# Patient Record
Sex: Male | Born: 1935 | Race: White | Hispanic: No | Marital: Married | Smoking: Never smoker
Health system: Southern US, Community
[De-identification: ages and names within clinical notes are randomized; demographics above are authoritative.]

## PROBLEM LIST (undated history)

## (undated) DIAGNOSIS — N419 Inflammatory disease of prostate, unspecified: Secondary | ICD-10-CM

## (undated) DIAGNOSIS — C801 Malignant (primary) neoplasm, unspecified: Secondary | ICD-10-CM

## (undated) DIAGNOSIS — I10 Essential (primary) hypertension: Secondary | ICD-10-CM

## (undated) HISTORY — PX: BACK SURGERY: SHX140

---

## 2011-09-04 ENCOUNTER — Emergency Department (HOSPITAL_COMMUNITY): Payer: Medicare Other

## 2011-09-04 ENCOUNTER — Encounter (HOSPITAL_COMMUNITY): Payer: Self-pay | Admitting: *Deleted

## 2011-09-04 ENCOUNTER — Emergency Department (HOSPITAL_COMMUNITY)
Admission: EM | Admit: 2011-09-04 | Discharge: 2011-09-04 | Disposition: A | Discharge status: Home / Self Care | Payer: Medicare Other | Attending: Emergency Medicine | Admitting: Emergency Medicine

## 2011-09-04 DIAGNOSIS — F17200 Nicotine dependence, unspecified, uncomplicated: Secondary | ICD-10-CM | POA: Diagnosis not present

## 2011-09-04 DIAGNOSIS — N400 Enlarged prostate without lower urinary tract symptoms: Secondary | ICD-10-CM | POA: Diagnosis not present

## 2011-09-04 DIAGNOSIS — R339 Retention of urine, unspecified: Secondary | ICD-10-CM

## 2011-09-04 DIAGNOSIS — R319 Hematuria, unspecified: Secondary | ICD-10-CM

## 2011-09-04 DIAGNOSIS — I10 Essential (primary) hypertension: Secondary | ICD-10-CM | POA: Diagnosis not present

## 2011-09-04 DIAGNOSIS — N390 Urinary tract infection, site not specified: Secondary | ICD-10-CM | POA: Diagnosis not present

## 2011-09-04 HISTORY — DX: Essential (primary) hypertension: I10

## 2011-09-04 HISTORY — DX: Inflammatory disease of prostate, unspecified: N41.9

## 2011-09-04 LAB — BASIC METABOLIC PANEL
BUN: 10 mg/dL (ref 6–23)
Chloride: 96 mEq/L (ref 96–112)
GFR calc non Af Amer: >90 mL/min (ref >90)
Glucose, Bld: 178 mg/dL — ABNORMAL HIGH (ref 70–99)
Potassium: 3 mEq/L — ABNORMAL LOW (ref 3.5–5.1)
Sodium: 131 mEq/L — ABNORMAL LOW (ref 135–145)

## 2011-09-04 LAB — URINALYSIS, ROUTINE W REFLEX MICROSCOPIC
Bilirubin Urine: NEGATIVE
Ketones, ur: NEGATIVE mg/dL
Leukocytes, UA: NEGATIVE
Nitrite: NEGATIVE
Protein, ur: 30 mg/dL — AB
Urobilinogen, UA: 0.2 mg/dL (ref 0.0–1.0)
pH: 7 (ref 5.0–8.0)

## 2011-09-04 LAB — POCT I-STAT, CHEM 8
BUN: 10 mg/dL (ref 6–23)
Calcium, Ion: 1.09 mmol/L — ABNORMAL LOW (ref 1.13–1.30)
Chloride: 101 mEq/L (ref 96–112)
Glucose, Bld: 181 mg/dL — ABNORMAL HIGH (ref 70–99)
HCT: 44 % (ref 39.0–52.0)
Potassium: 3.2 mEq/L — ABNORMAL LOW (ref 3.5–5.1)

## 2011-09-04 LAB — CBC WITH DIFFERENTIAL/PLATELET
Basophils Absolute: 0 10*3/uL (ref 0.0–0.1)
Eosinophils Relative: 1 % (ref 0–5)
HCT: 39.5 % (ref 39.0–52.0)
Hemoglobin: 13.7 g/dL (ref 13.0–17.0)
Lymphocytes Relative: 12 % (ref 12–46)
Lymphs Abs: 1 10*3/uL (ref 0.7–4.0)
MCV: 89.8 fL (ref 78.0–100.0)
Monocytes Absolute: 0.5 10*3/uL (ref 0.1–1.0)
Monocytes Relative: 7 % (ref 3–12)
Neutro Abs: 6.7 10*3/uL (ref 1.7–7.7)
RBC: 4.4 MIL/uL (ref 4.22–5.81)
WBC: 8.3 10*3/uL (ref 4.0–10.5)

## 2011-09-04 LAB — URINE MICROSCOPIC-ADD ON

## 2011-09-04 MED ORDER — HYDROCODONE-ACETAMINOPHEN 5-325 MG PO TABS: 2.0000 | ORAL_TABLET | ORAL | Status: AC | PRN

## 2011-09-04 MED ORDER — SULFAMETHOXAZOLE-TRIMETHOPRIM 800-160 MG PO TABS: 1.0000 | ORAL_TABLET | Freq: Two times a day (BID) | ORAL | Status: AC

## 2011-09-04 NOTE — ED Provider Notes (Signed)
History     CSN: [id number]  Arrival date & time 09/04/11  1814   First MD Initiated Contact with Patient 09/04/11 1824      Chief Complaint  Patient presents with  . Hematuria  . Urinary Retention    (Consider location/radiation/quality/duration/timing/severity/associated sxs/prior treatment) HPI Comments: Patient with history of "prostate problems" presenting with difficulty with urination or blood in the urine. States he noticed blood in his urine last night which persisted all day. A more difficult to urinate he has been unable to urinate for several hours. Denies any fever or vomiting. Complains of bladder discomfort. No back pain, testicular pain, chest pain or shortness of breath. Is not taking any anticoagulants. Has had prostatitis in the past and required foley catheter.  The history is provided by the patient and the spouse.    Past Medical History  Diagnosis Date  . Prostatitis   . Hypertension     Past Surgical History  Procedure Date  . Back surgery     No family history on file.  History  Substance Use Topics  . Smoking status: Current Everyday Smoker  . Smokeless tobacco: Not on file  . Alcohol Use: Yes      Review of Systems  Constitutional: Negative for fever, activity change and appetite change.  HENT: Negative for congestion and rhinorrhea.   Respiratory: Negative for cough, chest tightness and shortness of breath.   Cardiovascular: Negative for chest pain.  Gastrointestinal: Positive for abdominal pain. Negative for nausea and vomiting.  Genitourinary: Positive for dysuria, hematuria and difficulty urinating. Negative for testicular pain.  Musculoskeletal: Negative for back pain.  Neurological: Negative for dizziness and headaches.    Allergies  Review of patient's allergies indicates no known allergies.  Home Medications  No current outpatient prescriptions on file.  BP 200/119  Pulse 94  Temp 98.5 F (36.9 C) (Oral)  Resp 18  Wt  195 lb (88.451 kg)  SpO2 99%  Physical Exam  Constitutional: He is oriented to person, place, and time. He appears well-developed and well-nourished.       uncomfortable  HENT:  Head: Normocephalic and atraumatic.  Mouth/Throat: Oropharynx is clear and moist. No oropharyngeal exudate.  Eyes: Conjunctivae are normal. Pupils are equal, round, and reactive to light.  Neck: Normal range of motion.  Cardiovascular: Normal rate, regular rhythm and normal heart sounds.   Pulmonary/Chest: Effort normal and breath sounds normal. No respiratory distress.  Abdominal: Soft. There is tenderness. There is no rebound and no guarding.       Suprapubic tenderness  Genitourinary:       Prostate firm and nontender  Musculoskeletal: Normal range of motion. He exhibits no edema and no tenderness.  Neurological: He is alert and oriented to person, place, and time. No cranial nerve deficit.  Skin: Skin is warm.    ED Course  Procedures (including critical care time)  Labs Reviewed  URINALYSIS, ROUTINE W REFLEX MICROSCOPIC - Abnormal; Notable for the following:    Color, Urine RED (*)  BIOCHEMICALS MAY BE AFFECTED BY COLOR   Specific Gravity, Urine <1.005 (*)     Hgb urine dipstick LARGE (*)     Protein, ur 30 (*)     All other components within normal limits  CBC WITH DIFFERENTIAL - Abnormal; Notable for the following:    Neutrophils Relative 81 (*)     All other components within normal limits  BASIC METABOLIC PANEL - Abnormal; Notable for the following:    Sodium  131 (*)     Potassium 3.0 (*)     Glucose, Bld 178 (*)     All other components within normal limits  POCT I-STAT, CHEM 8 - Abnormal; Notable for the following:    Potassium 3.2 (*)     Glucose, Bld 181 (*)     Calcium, Ion 1.09 (*)     All other components within normal limits  URINE MICROSCOPIC-ADD ON - Abnormal; Notable for the following:    Bacteria, UA MANY (*)     All other components within normal limits  PROTIME-INR    URINE CULTURE   Ct Abdomen Pelvis Wo Contrast  09/04/2011  *RADIOLOGY REPORT*  Clinical Data: Gross hematuria.  Urinary retention.  History of prostatitis.  CT ABDOMEN AND PELVIS WITHOUT CONTRAST  Technique:  Multidetector CT imaging of the abdomen and pelvis was performed following the standard protocol without intravenous contrast.  Comparison: None.  Findings: There is abnormal pleural and parenchymal density affecting the right middle lobe and adjacent pleura.  There is coronary artery calcification.  No pericardial or pleural fluid.  The liver has a normal appearance without focal lesions or biliary ductal dilatation.  No calcified gallstones.  The spleen is normal. The pancreas is normal.  The adrenal glands are normal.  There is no renal mass, cyst or hydronephrosis.  There is a 1 mm calcification in the right kidney that could be a stone or vascular.  The aorta shows atherosclerosis but no aneurysm.  The IVC is unremarkable.  No retroperitoneal mass or adenopathy.  No free intraperitoneal fluid or air.  No bowel pathology is seen other than sigmoid diverticulosis without evidence of diverticulitis.  A Foley catheter is present in the bladder.  The bladder is decompressed.  No discernible bladder lesion.  The prostate gland is slightly enlarged.  Seminal vesicles appear normal.  Ordinary degenerative changes effect the spine.  IMPRESSION: No acute finding.  Foley catheter in collapsed bladder.  Slightly enlarged prostate.  No renal abnormalities seen.  Pleural and parenchymal scarring in the right middle lobe and adjacent pleura.  Original Report Authenticated By: Thomasenia Sales, M.D.     No diagnosis found.    MDM  Urinary retention and hematuria x 1 day. Abdomen soft and nontender.  Prostate firm and nonboggy.  Gross hematuria with spontaneous clearing upon foley placement. Hemoglobin stable. Orthostatics negative.  CT negative for obstructing process, stone, or mass.   UA with RBCs,  WBCs, bacteria. Nitrite and LE negative.  Will treat for UTI.  D/w patient importance of followup with urology to further evaluate hematuria.  Will need cystoscopy. Patient expresses understanding.  Will d/c with foley.  Return precautions discussed.     Glynn Octave, MD 09/04/11 2053

## 2011-09-04 NOTE — ED Notes (Signed)
Pt states he began noticing a lot of blood in his urine last night and has gotten worse. Pt also states urinary retention.

## 2011-09-04 NOTE — ED Notes (Signed)
Foley patent, bright pink color urine in foley tube, pt verbalizes relief from pressure to lower abd but admits to nausea at this time

## 2011-09-04 NOTE — ED Notes (Signed)
Explained to pt and wife on how to use foley leg bag and hygiene of foley, d/c instructions and prescriptions reviewed as well, wife and pt verbalized understanding, pt insisted on ambulating out and refused wheelchair, instructed pt to encourage fluids

## 2011-09-06 NOTE — ED Notes (Signed)
Chart was opened due to pt calling and asking question about his discharge instructions concerning his catheter.

## 2011-09-07 LAB — URINE CULTURE

## 2011-09-09 DIAGNOSIS — R310 Gross hematuria: Secondary | ICD-10-CM | POA: Diagnosis not present

## 2011-09-09 DIAGNOSIS — N401 Enlarged prostate with lower urinary tract symptoms: Secondary | ICD-10-CM | POA: Diagnosis not present

## 2011-10-06 DIAGNOSIS — N401 Enlarged prostate with lower urinary tract symptoms: Secondary | ICD-10-CM | POA: Diagnosis not present

## 2011-10-06 DIAGNOSIS — R350 Frequency of micturition: Secondary | ICD-10-CM | POA: Diagnosis not present

## 2011-10-06 DIAGNOSIS — R310 Gross hematuria: Secondary | ICD-10-CM | POA: Diagnosis not present

## 2011-10-19 DIAGNOSIS — N401 Enlarged prostate with lower urinary tract symptoms: Secondary | ICD-10-CM | POA: Diagnosis not present

## 2011-10-21 DIAGNOSIS — R350 Frequency of micturition: Secondary | ICD-10-CM | POA: Diagnosis not present

## 2011-10-21 DIAGNOSIS — R972 Elevated prostate specific antigen [PSA]: Secondary | ICD-10-CM | POA: Diagnosis not present

## 2011-12-02 DIAGNOSIS — R350 Frequency of micturition: Secondary | ICD-10-CM | POA: Diagnosis not present

## 2011-12-02 DIAGNOSIS — C679 Malignant neoplasm of bladder, unspecified: Secondary | ICD-10-CM | POA: Diagnosis not present

## 2011-12-02 DIAGNOSIS — R972 Elevated prostate specific antigen [PSA]: Secondary | ICD-10-CM | POA: Diagnosis not present

## 2011-12-02 DIAGNOSIS — N401 Enlarged prostate with lower urinary tract symptoms: Secondary | ICD-10-CM | POA: Diagnosis not present

## 2011-12-02 DIAGNOSIS — R339 Retention of urine, unspecified: Secondary | ICD-10-CM | POA: Diagnosis not present

## 2011-12-31 DIAGNOSIS — R39198 Other difficulties with micturition: Secondary | ICD-10-CM | POA: Diagnosis not present

## 2011-12-31 DIAGNOSIS — R3129 Other microscopic hematuria: Secondary | ICD-10-CM | POA: Diagnosis not present

## 2011-12-31 DIAGNOSIS — R350 Frequency of micturition: Secondary | ICD-10-CM | POA: Diagnosis not present

## 2011-12-31 DIAGNOSIS — N401 Enlarged prostate with lower urinary tract symptoms: Secondary | ICD-10-CM | POA: Diagnosis not present

## 2012-02-02 DIAGNOSIS — R310 Gross hematuria: Secondary | ICD-10-CM | POA: Diagnosis not present

## 2012-02-02 DIAGNOSIS — R972 Elevated prostate specific antigen [PSA]: Secondary | ICD-10-CM | POA: Diagnosis not present

## 2012-02-02 DIAGNOSIS — R339 Retention of urine, unspecified: Secondary | ICD-10-CM | POA: Diagnosis not present

## 2012-02-02 DIAGNOSIS — I10 Essential (primary) hypertension: Secondary | ICD-10-CM | POA: Diagnosis not present

## 2012-02-07 ENCOUNTER — Emergency Department (HOSPITAL_COMMUNITY): Payer: Medicare Other

## 2012-02-07 ENCOUNTER — Emergency Department (HOSPITAL_COMMUNITY)
Admission: EM | Admit: 2012-02-07 | Discharge: 2012-02-07 | Disposition: A | Discharge status: Home / Self Care | Payer: Medicare Other | Attending: Emergency Medicine | Admitting: Emergency Medicine

## 2012-02-07 ENCOUNTER — Encounter (HOSPITAL_COMMUNITY): Payer: Self-pay | Admitting: *Deleted

## 2012-02-07 DIAGNOSIS — Z862 Personal history of diseases of the blood and blood-forming organs and certain disorders involving the immune mechanism: Secondary | ICD-10-CM | POA: Diagnosis not present

## 2012-02-07 DIAGNOSIS — Y846 Urinary catheterization as the cause of abnormal reaction of the patient, or of later complication, without mention of misadventure at the time of the procedure: Secondary | ICD-10-CM | POA: Insufficient documentation

## 2012-02-07 DIAGNOSIS — R3919 Other difficulties with micturition: Secondary | ICD-10-CM | POA: Insufficient documentation

## 2012-02-07 DIAGNOSIS — I10 Essential (primary) hypertension: Secondary | ICD-10-CM | POA: Insufficient documentation

## 2012-02-07 DIAGNOSIS — N419 Inflammatory disease of prostate, unspecified: Secondary | ICD-10-CM | POA: Insufficient documentation

## 2012-02-07 DIAGNOSIS — R4701 Aphasia: Secondary | ICD-10-CM | POA: Diagnosis not present

## 2012-02-07 DIAGNOSIS — J438 Other emphysema: Secondary | ICD-10-CM | POA: Diagnosis not present

## 2012-02-07 DIAGNOSIS — R319 Hematuria, unspecified: Secondary | ICD-10-CM | POA: Diagnosis not present

## 2012-02-07 DIAGNOSIS — F17200 Nicotine dependence, unspecified, uncomplicated: Secondary | ICD-10-CM | POA: Insufficient documentation

## 2012-02-07 DIAGNOSIS — R55 Syncope and collapse: Secondary | ICD-10-CM | POA: Diagnosis not present

## 2012-02-07 DIAGNOSIS — Z9889 Other specified postprocedural states: Secondary | ICD-10-CM | POA: Insufficient documentation

## 2012-02-07 DIAGNOSIS — R4182 Altered mental status, unspecified: Secondary | ICD-10-CM | POA: Diagnosis not present

## 2012-02-07 LAB — CBC WITH DIFFERENTIAL/PLATELET
Basophils Absolute: 0 10*3/uL (ref 0.0–0.1)
HCT: 36.4 % — ABNORMAL LOW (ref 39.0–52.0)
Lymphocytes Relative: 7 % — ABNORMAL LOW (ref 12–46)
Lymphs Abs: 0.8 10*3/uL (ref 0.7–4.0)
Neutro Abs: 10.3 10*3/uL — ABNORMAL HIGH (ref 1.7–7.7)
Platelets: 256 10*3/uL (ref 150–400)
RBC: 4.06 MIL/uL — ABNORMAL LOW (ref 4.22–5.81)
RDW: 12.7 % (ref 11.5–15.5)
WBC: 11.6 10*3/uL — ABNORMAL HIGH (ref 4.0–10.5)

## 2012-02-07 LAB — URINALYSIS, ROUTINE W REFLEX MICROSCOPIC
Glucose, UA: 250 mg/dL — AB
Leukocytes, UA: NEGATIVE
Protein, ur: 100 mg/dL — AB
Specific Gravity, Urine: 1.02 (ref 1.005–1.030)
pH: 6 (ref 5.0–8.0)

## 2012-02-07 LAB — BASIC METABOLIC PANEL
CO2: 20 mEq/L (ref 19–32)
Chloride: 95 mEq/L — ABNORMAL LOW (ref 96–112)
Glucose, Bld: 227 mg/dL — ABNORMAL HIGH (ref 70–99)
Potassium: 3.2 mEq/L — ABNORMAL LOW (ref 3.5–5.1)
Sodium: 133 mEq/L — ABNORMAL LOW (ref 135–145)

## 2012-02-07 LAB — TYPE AND SCREEN
ABO/RH(D): A POS
Antibody Screen: NEGATIVE

## 2012-02-07 LAB — URINE MICROSCOPIC-ADD ON

## 2012-02-07 LAB — GLUCOSE, CAPILLARY: Glucose-Capillary: 208 mg/dL — ABNORMAL HIGH (ref 70–99)

## 2012-02-07 MED ORDER — ONDANSETRON HCL 4 MG/2ML IJ SOLN
4.0000 mg | Freq: Once | INTRAMUSCULAR | Status: AC
  Administered 2012-02-07: 4 mg via INTRAVENOUS
  Filled 2012-02-07: qty 2

## 2012-02-07 MED ORDER — LIDOCAINE HCL 2 % EX GEL
Freq: Once | CUTANEOUS | Status: AC
  Administered 2012-02-07: 10 via URETHRAL
  Filled 2012-02-07: qty 10

## 2012-02-07 MED ORDER — POTASSIUM CHLORIDE 10 MEQ/100ML IV SOLN: 10.0000 meq | INTRAVENOUS | Status: DC

## 2012-02-07 MED ORDER — LORAZEPAM 2 MG/ML IJ SOLN
1.0000 mg | Freq: Once | INTRAMUSCULAR | Status: AC
  Administered 2012-02-07: 1 mg via INTRAVENOUS
  Filled 2012-02-07: qty 1

## 2012-02-07 MED ORDER — FENTANYL CITRATE 0.05 MG/ML IJ SOLN
50.0000 ug | Freq: Once | INTRAMUSCULAR | Status: AC
  Administered 2012-02-07: 50 ug via INTRAVENOUS
  Filled 2012-02-07: qty 2

## 2012-02-07 MED ORDER — SODIUM CHLORIDE 0.9 % IV SOLN
INTRAVENOUS | Status: DC
  Administered 2012-02-07: 20:00:00 via INTRAVENOUS

## 2012-02-07 MED ORDER — POTASSIUM CHLORIDE CRYS ER 20 MEQ PO TBCR
40.0000 meq | EXTENDED_RELEASE_TABLET | Freq: Once | ORAL | Status: AC
  Administered 2012-02-07: 40 meq via ORAL
  Filled 2012-02-07: qty 2

## 2012-02-07 MED ORDER — SODIUM CHLORIDE 0.9 % IV BOLUS (SEPSIS)
1000.0000 mL | Freq: Once | INTRAVENOUS | Status: AC
  Administered 2012-02-07: 1000 mL via INTRAVENOUS

## 2012-02-07 NOTE — ED Provider Notes (Signed)
History  This chart was scribed for Donnetta Hutching, MD by Bennett Scrape, ED Scribe. This patient was seen in room APA18/APA18 and the patient's care was started at 5:30 PM.  CSN: [id number]  Arrival date & time 02/07/12  1712   First MD Initiated Contact with Patient 02/07/12 1730     Level 5 Caveat- AMS  Chief Complaint  Patient presents with  . Weakness    The history is provided by the spouse. No language interpreter was used.    Benjamin Yang is a 76 y.o. male who presents to the Emergency Department complaining of sudden onset, non-changing, constant AMS of aphasia and decreased orientation after one episode of syncope 1.5 hours ago. Per wife, pt fell against the wall and was unable to support himself. She states that she had to hold him up and he appeared to have a fixed gaxe and was unresponsive to verbal questioning. She denies any recent falls or head trauma. She does state that the pt removed a foley catheter himself today under the orders of his urologist after it was placed by his urologist 6 days ago. She is unsure about the exact reason of the foley insertion but reports that the pt was urinating frequently and had a decreased urine output. She admits that the pt did not clip the balloon when he removed the foley and reports that he has been having moderate penile bleeding since removing it this morning. She states that the pt has been urinating in small amounts since the foley was removed. The pt has a h/o HTN and wife reports that the pt did have a problem with hyperglycemia that is currently diet controlled but denies any diagnosed h/o DM. Pt is a current everyday smoker and occasional alcohol user.   Dr. Sid Falcon is urologist Dr. Juanetta Gosling is PCP.  Past Medical History  Diagnosis Date  . Prostatitis   . Hypertension     Past Surgical History  Procedure Date  . Back surgery     No family history on file.  History  Substance Use Topics  . Smoking status: Current  Every Day Smoker  . Smokeless tobacco: Not on file  . Alcohol Use: Yes      Review of Systems  Unable to perform ROS: Mental status change    Allergies  Review of patient's allergies indicates no known allergies.  Home Medications  No current outpatient prescriptions on file.  Triage Vitals: BP 161/79  Pulse 95  Temp 95.3 F (35.2 C) (Rectal)  Resp 20  SpO2 100%  Physical Exam  Nursing note and vitals reviewed. Constitutional: He appears well-developed and well-nourished.       Pt is confused and pallor is noted  HENT:  Head: Normocephalic and atraumatic.  Eyes: Conjunctivae normal and EOM are normal. Pupils are equal, round, and reactive to light.  Neck: Normal range of motion. Neck supple.  Cardiovascular: Normal rate, regular rhythm and normal heart sounds.   Pulmonary/Chest: Effort normal and breath sounds normal.  Abdominal: Soft. Bowel sounds are normal.  Genitourinary:       Blood around the urethra, no signs of infection to the penis or scrotum, no erythema to the penis or scrotum   Musculoskeletal: Normal range of motion.  Neurological: He is alert.       Pt is confused but appeared to be oriented to person and place, not answering questions appropriately, moved extrmeities on command but speaking in gibberish  Skin: Skin is warm and dry.  Pallor noted  Psychiatric: He has a normal mood and affect.    ED Course  Procedures (including critical care time)  DIAGNOSTIC STUDIES: Oxygen Saturation is 100% on room air, normal by my interpretation.    COORDINATION OF CARE: 5:37 PM- Discussed treatment plan which includes CT scan of head, blood work and UA with wife at bedside and wife agreed to plan. Advised wife that admission is probable and she agreed.  5:45 PM- Ordered 1,000 mL of Bolus   6:36 PM- Pt rechecked and still appears confused but mental status appears to be improving. Pt's wife agrees with the assessment.   6:45 PM- Ordered 100 mL of  potassium chloride   Labs Reviewed  CBC WITH DIFFERENTIAL - Abnormal; Notable for the following:    WBC 11.6 (*)     RBC 4.06 (*)     Hemoglobin 12.8 (*)     HCT 36.4 (*)     Neutrophils Relative 88 (*)     Neutro Abs 10.3 (*)     Lymphocytes Relative 7 (*)     All other components within normal limits  BASIC METABOLIC PANEL - Abnormal; Notable for the following:    Sodium 133 (*)     Potassium 3.2 (*)     Chloride 95 (*)     Glucose, Bld 227 (*)     GFR calc non Af Amer 80 (*)     All other components within normal limits  GLUCOSE, CAPILLARY - Abnormal; Notable for the following:    Glucose-Capillary 208 (*)     All other components within normal limits  TYPE AND SCREEN  URINALYSIS, ROUTINE W REFLEX MICROSCOPIC    Dg Chest 1 View  02/07/2012  *RADIOLOGY REPORT*  Clinical Data: Altered mental status.  Hypertension.  Cigarette smoker.  CHEST - 1 VIEW  Comparison: None.  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  There is no pneumothorax identified.  Cardiopericardial silhouette appears within normal limits. Monitoring leads are projected over the chest.  Faint patchy density is present at the left lung base, probably representing atelectasis.  No focal consolidation.  Trachea midline.  Mediastinal contours are within normal limits.  Aortic arch atherosclerosis. Monitoring leads are projected over the chest. Retrocardiac density is present, possibly representing either tortuous thoracic aorta or small hiatal hernia. Question small right pleural effusion.  Lateral view may be helpful in further evaluation.  IMPRESSION:  1.  Left basilar density, probably represents atelectasis. Pneumonia is considered less likely. 2.  Possible small right pleural effusion versus flattening of the hemidiaphragm associated with emphysema.   Original Report Authenticated By: Andreas Newport, M.D.    Ct Head Wo Contrast  02/07/2012  *RADIOLOGY REPORT*  Clinical Data: Altered mental  status.  Weakness.  CT HEAD WITHOUT CONTRAST  Technique:  Contiguous axial images were obtained from the base of the skull through the vertex without contrast.  Comparison: None.  Findings: No mass lesion, mass effect, midline shift, hydrocephalus, hemorrhage.  No acute territorial cortical ischemia/infarct. Atrophy and chronic ischemic white matter disease is present.  Mastoid air cells clear.  Scattered ethmoid sinus mucosal thickening.  IMPRESSION: Atrophy and chronic ischemic white matter disease.  No acute intracranial abnormality.   Original Report Authenticated By: Andreas Newport, M.D.       Date: 02/07/2012  Rate: 88  Rhythm: normal sinus rhythm  QRS Axis: normal  Intervals: normal  ST/T Wave abnormalities: normal  Conduction Disutrbances:none  Narrative Interpretation:  Old EKG Reviewed: none available PAC, PVC No results found.   No diagnosis found.    MDM  Rechecked multiple times in emergency department.  I think patient had micturition syncope.  Registered nurse was able to reinsert Foley catheter. Greater than 800 cc of lightly bloody urine returned.  Patient is already on Cipro.  Will see urologist on Wednesday in [place], [place] or recheck at [facility] in [place] if symptoms persist   I personally performed the services described in this documentation, which was scribed in my presence. The recorded information has been reviewed and is accurate.       Donnetta Hutching, MD 02/07/12 2217

## 2012-02-07 NOTE — ED Notes (Signed)
Wife states he passed out this afternoon after pulling foley out, wife states he was unresponsive at the time. Speech is not clear

## 2012-02-07 NOTE — ED Notes (Signed)
Patient had an indwelling foley and pulled it out today, has been passing blood ever since, c/o weakness now

## 2012-02-07 NOTE — ED Notes (Signed)
Pt discharged. Pt stable at time of discharge. Medications reviewed pt has no questions regarding discharge at this time. Pt voiced understanding of discharge instructions.  

## 2012-02-07 NOTE — ED Notes (Signed)
Family at bedside. 

## 2012-02-08 LAB — URINE CULTURE: Culture: NO GROWTH

## 2012-02-14 DIAGNOSIS — N401 Enlarged prostate with lower urinary tract symptoms: Secondary | ICD-10-CM | POA: Diagnosis not present

## 2012-02-14 DIAGNOSIS — R972 Elevated prostate specific antigen [PSA]: Secondary | ICD-10-CM | POA: Diagnosis not present

## 2012-02-28 DIAGNOSIS — N401 Enlarged prostate with lower urinary tract symptoms: Secondary | ICD-10-CM | POA: Diagnosis not present

## 2012-02-28 DIAGNOSIS — R339 Retention of urine, unspecified: Secondary | ICD-10-CM | POA: Diagnosis not present

## 2012-03-16 DIAGNOSIS — N419 Inflammatory disease of prostate, unspecified: Secondary | ICD-10-CM | POA: Diagnosis not present

## 2012-03-16 DIAGNOSIS — I10 Essential (primary) hypertension: Secondary | ICD-10-CM | POA: Diagnosis not present

## 2012-03-16 DIAGNOSIS — N400 Enlarged prostate without lower urinary tract symptoms: Secondary | ICD-10-CM | POA: Diagnosis not present

## 2012-03-27 DIAGNOSIS — R972 Elevated prostate specific antigen [PSA]: Secondary | ICD-10-CM | POA: Diagnosis not present

## 2012-03-27 DIAGNOSIS — N401 Enlarged prostate with lower urinary tract symptoms: Secondary | ICD-10-CM | POA: Diagnosis not present

## 2012-03-27 DIAGNOSIS — R350 Frequency of micturition: Secondary | ICD-10-CM | POA: Diagnosis not present

## 2012-04-03 DIAGNOSIS — N401 Enlarged prostate with lower urinary tract symptoms: Secondary | ICD-10-CM | POA: Diagnosis not present

## 2012-04-03 DIAGNOSIS — R350 Frequency of micturition: Secondary | ICD-10-CM | POA: Diagnosis not present

## 2012-04-03 DIAGNOSIS — R972 Elevated prostate specific antigen [PSA]: Secondary | ICD-10-CM | POA: Diagnosis not present

## 2012-04-21 ENCOUNTER — Encounter (HOSPITAL_COMMUNITY): Payer: Self-pay | Admitting: Emergency Medicine

## 2012-04-21 ENCOUNTER — Emergency Department (HOSPITAL_COMMUNITY)
Admission: EM | Admit: 2012-04-21 | Discharge: 2012-04-21 | Disposition: A | Discharge status: Home / Self Care | Payer: Medicare Other | Attending: Emergency Medicine | Admitting: Emergency Medicine

## 2012-04-21 DIAGNOSIS — R300 Dysuria: Secondary | ICD-10-CM | POA: Diagnosis not present

## 2012-04-21 DIAGNOSIS — R34 Anuria and oliguria: Secondary | ICD-10-CM | POA: Insufficient documentation

## 2012-04-21 DIAGNOSIS — I10 Essential (primary) hypertension: Secondary | ICD-10-CM | POA: Insufficient documentation

## 2012-04-21 DIAGNOSIS — Z87448 Personal history of other diseases of urinary system: Secondary | ICD-10-CM | POA: Insufficient documentation

## 2012-04-21 DIAGNOSIS — R109 Unspecified abdominal pain: Secondary | ICD-10-CM | POA: Diagnosis not present

## 2012-04-21 DIAGNOSIS — R339 Retention of urine, unspecified: Secondary | ICD-10-CM | POA: Diagnosis not present

## 2012-04-21 DIAGNOSIS — Z79899 Other long term (current) drug therapy: Secondary | ICD-10-CM | POA: Diagnosis not present

## 2012-04-21 LAB — URINALYSIS, ROUTINE W REFLEX MICROSCOPIC
Nitrite: POSITIVE — AB
Specific Gravity, Urine: 1.01 (ref 1.005–1.030)
Urobilinogen, UA: 0.2 mg/dL (ref 0.0–1.0)

## 2012-04-21 MED ORDER — CIPROFLOXACIN HCL 250 MG PO TABS
500.0000 mg | ORAL_TABLET | Freq: Once | ORAL | Status: AC
  Administered 2012-04-21: 500 mg via ORAL
  Filled 2012-04-21: qty 2

## 2012-04-21 MED ORDER — CIPROFLOXACIN HCL 500 MG PO TABS: 250.0000 mg | ORAL_TABLET | Freq: Two times a day (BID) | ORAL | Status: DC

## 2012-04-21 NOTE — ED Notes (Signed)
Patient has enlarged prostate and does self caths at home.  Patient states unable to get catheter to advance.

## 2012-04-21 NOTE — ED Notes (Signed)
Discharge instructions given and reviewed with patient.  Prescription given for Cipro; effects and use explained.  Patient verbalized understanding to complete all antibiotic and to follow up with urologist.  Patient given leg bag for day time use; patient left with foley bag in place per request.  Patient discharged home in good condition.

## 2012-04-21 NOTE — ED Notes (Signed)
Attempted foley catheter without success.

## 2012-04-21 NOTE — ED Provider Notes (Addendum)
History     CSN: [id number]  Arrival date & time 04/21/12  0115   First MD Initiated Contact with Patient 04/21/12 0149      Chief Complaint  Patient presents with  . Urinary Retention    (Consider location/radiation/quality/duration/timing/severity/associated sxs/prior treatment) HPI Benjamin Yang is a 77 y.o. male who presents to the Emergency Department complaining of urinary retention that began tonight. He does self cath and had some problems with cath earlier in the day. Tonight became uncomfortable and unable to void. Small amounts of urine would leak out. Denies nausea, vomiting.   PCP Dr. Juanetta Gosling Urologist in [place]  Past Medical History  Diagnosis Date  . Prostatitis   . Hypertension     Past Surgical History  Procedure Laterality Date  . Back surgery      No family history on file.  History  Substance Use Topics  . Smoking status: Never Smoker   . Smokeless tobacco: Not on file  . Alcohol Use: Yes      Review of Systems  Constitutional: Negative for fever.       10 Systems reviewed and are negative for acute change except as noted in the HPI.  HENT: Negative for congestion.   Eyes: Negative for discharge and redness.  Respiratory: Negative for cough and shortness of breath.   Cardiovascular: Negative for chest pain.  Gastrointestinal: Positive for abdominal pain. Negative for vomiting.  Genitourinary: Positive for decreased urine volume and difficulty urinating.  Musculoskeletal: Negative for back pain.  Skin: Negative for rash.  Neurological: Negative for syncope, numbness and headaches.  Psychiatric/Behavioral:       No behavior change.    Allergies  Percocet  Home Medications   Current Outpatient Rx  Name  Route  Sig  Dispense  Refill  . amLODipine (NORVASC) 10 MG tablet   Oral   Take 10 mg by mouth daily.         . Dutasteride-Tamsulosin HCl (JALYN) 0.5-0.4 MG CAPS   Oral   Take 1 tablet by mouth as directed.            BP 179/90  Pulse 100  SpO2 100%  Physical Exam  Nursing note and vitals reviewed. Constitutional:  Awake, alert, nontoxic appearance.  HENT:  Head: Atraumatic.  Eyes: Right eye exhibits no discharge. Left eye exhibits no discharge.  Neck: Neck supple.  Cardiovascular: Normal rate.   Pulmonary/Chest: Effort normal and breath sounds normal. He exhibits no tenderness.  Abdominal: Soft. There is no tenderness. There is no rebound.  Bladder fullness and tenderness to palpation.  Musculoskeletal: He exhibits no tenderness.  Baseline ROM, no obvious new focal weakness.  Neurological:  Mental status and motor strength appears baseline for patient and situation.  Skin: No rash noted.  Psychiatric: He has a normal mood and affect.    ED Course  Procedures (including critical care time)  Results for orders placed during the hospital encounter of 04/21/12  URINALYSIS, ROUTINE W REFLEX MICROSCOPIC      Result Value Range   Color, Urine RED (*) YELLOW   APPearance HAZY (*) CLEAR   Specific Gravity, Urine 1.010  1.005 - 1.030   pH 6.5  5.0 - 8.0   Glucose, UA NEGATIVE  NEGATIVE mg/dL   Hgb urine dipstick LARGE (*) NEGATIVE   Bilirubin Urine MODERATE (*) NEGATIVE   Ketones, ur TRACE (*) NEGATIVE mg/dL   Protein, ur 409 (*) NEGATIVE mg/dL   Urobilinogen, UA 0.2  0.0 - 1.0  mg/dL   Nitrite POSITIVE (*) NEGATIVE   Leukocytes, UA TRACE (*) NEGATIVE  URINE MICROSCOPIC-ADD ON      Result Value Range   RBC / HPF TOO NUMEROUS TO COUNT  <3 RBC/hpf      MDM  Patient with urinary retention requiring a foley for relief. Started patient on Ciprofloxacin. He will retain foley and see his urologist tomorrow or Monday.Pt stable in ED with no significant deterioration in condition.The patient appears reasonably screened and/or stabilized for discharge and I doubt any other medical condition or other [facility] requiring further screening, evaluation, or treatment in the ED at this time prior to  discharge.  MDM Reviewed: nursing note and vitals Interpretation: labs           Nicoletta Dress. Colon Branch, MD 04/21/12 1610  Nicoletta Dress. Colon Branch, MD 04/21/12 [phone]

## 2012-05-03 DIAGNOSIS — R3915 Urgency of urination: Secondary | ICD-10-CM | POA: Diagnosis not present

## 2012-05-03 DIAGNOSIS — R39198 Other difficulties with micturition: Secondary | ICD-10-CM | POA: Diagnosis not present

## 2012-05-23 DIAGNOSIS — G834 Cauda equina syndrome: Secondary | ICD-10-CM | POA: Diagnosis not present

## 2012-05-23 DIAGNOSIS — N401 Enlarged prostate with lower urinary tract symptoms: Secondary | ICD-10-CM | POA: Diagnosis not present

## 2012-05-23 DIAGNOSIS — R351 Nocturia: Secondary | ICD-10-CM | POA: Diagnosis not present

## 2012-05-23 DIAGNOSIS — R3915 Urgency of urination: Secondary | ICD-10-CM | POA: Diagnosis not present

## 2012-05-23 DIAGNOSIS — R3911 Hesitancy of micturition: Secondary | ICD-10-CM | POA: Diagnosis not present

## 2012-05-30 DIAGNOSIS — R3915 Urgency of urination: Secondary | ICD-10-CM | POA: Diagnosis not present

## 2012-05-30 DIAGNOSIS — R3911 Hesitancy of micturition: Secondary | ICD-10-CM | POA: Diagnosis not present

## 2012-05-30 DIAGNOSIS — N401 Enlarged prostate with lower urinary tract symptoms: Secondary | ICD-10-CM | POA: Diagnosis not present

## 2012-05-30 DIAGNOSIS — N390 Urinary tract infection, site not specified: Secondary | ICD-10-CM | POA: Diagnosis not present

## 2012-05-30 DIAGNOSIS — R351 Nocturia: Secondary | ICD-10-CM | POA: Diagnosis not present

## 2012-05-31 DIAGNOSIS — R3915 Urgency of urination: Secondary | ICD-10-CM | POA: Diagnosis not present

## 2012-05-31 DIAGNOSIS — R351 Nocturia: Secondary | ICD-10-CM | POA: Diagnosis not present

## 2012-05-31 DIAGNOSIS — N401 Enlarged prostate with lower urinary tract symptoms: Secondary | ICD-10-CM | POA: Diagnosis not present

## 2012-05-31 DIAGNOSIS — R3911 Hesitancy of micturition: Secondary | ICD-10-CM | POA: Diagnosis not present

## 2012-06-07 DIAGNOSIS — R3915 Urgency of urination: Secondary | ICD-10-CM | POA: Diagnosis not present

## 2012-06-07 DIAGNOSIS — R351 Nocturia: Secondary | ICD-10-CM | POA: Diagnosis not present

## 2012-06-07 DIAGNOSIS — R3911 Hesitancy of micturition: Secondary | ICD-10-CM | POA: Diagnosis not present

## 2012-06-07 DIAGNOSIS — N401 Enlarged prostate with lower urinary tract symptoms: Secondary | ICD-10-CM | POA: Diagnosis not present

## 2012-06-13 DIAGNOSIS — R39198 Other difficulties with micturition: Secondary | ICD-10-CM | POA: Diagnosis not present

## 2012-06-13 DIAGNOSIS — R339 Retention of urine, unspecified: Secondary | ICD-10-CM | POA: Diagnosis not present

## 2012-06-13 DIAGNOSIS — R3911 Hesitancy of micturition: Secondary | ICD-10-CM | POA: Diagnosis not present

## 2012-06-13 DIAGNOSIS — I10 Essential (primary) hypertension: Secondary | ICD-10-CM | POA: Diagnosis not present

## 2012-06-13 DIAGNOSIS — N401 Enlarged prostate with lower urinary tract symptoms: Secondary | ICD-10-CM | POA: Diagnosis not present

## 2012-06-13 DIAGNOSIS — N320 Bladder-neck obstruction: Secondary | ICD-10-CM | POA: Diagnosis not present

## 2012-06-13 DIAGNOSIS — Z791 Long term (current) use of non-steroidal anti-inflammatories (NSAID): Secondary | ICD-10-CM | POA: Diagnosis not present

## 2012-06-13 DIAGNOSIS — C61 Malignant neoplasm of prostate: Secondary | ICD-10-CM | POA: Diagnosis not present

## 2012-06-13 DIAGNOSIS — Z79899 Other long term (current) drug therapy: Secondary | ICD-10-CM | POA: Diagnosis not present

## 2012-06-13 DIAGNOSIS — R351 Nocturia: Secondary | ICD-10-CM | POA: Diagnosis not present

## 2012-06-13 DIAGNOSIS — R3915 Urgency of urination: Secondary | ICD-10-CM | POA: Diagnosis not present

## 2012-06-13 DIAGNOSIS — G834 Cauda equina syndrome: Secondary | ICD-10-CM | POA: Diagnosis not present

## 2012-06-15 DIAGNOSIS — R351 Nocturia: Secondary | ICD-10-CM | POA: Diagnosis not present

## 2012-06-15 DIAGNOSIS — C61 Malignant neoplasm of prostate: Secondary | ICD-10-CM | POA: Diagnosis not present

## 2012-06-15 DIAGNOSIS — G834 Cauda equina syndrome: Secondary | ICD-10-CM | POA: Diagnosis not present

## 2012-06-15 DIAGNOSIS — R3915 Urgency of urination: Secondary | ICD-10-CM | POA: Diagnosis not present

## 2012-06-15 DIAGNOSIS — R339 Retention of urine, unspecified: Secondary | ICD-10-CM | POA: Diagnosis not present

## 2012-06-15 DIAGNOSIS — C801 Malignant (primary) neoplasm, unspecified: Secondary | ICD-10-CM | POA: Diagnosis not present

## 2012-06-15 DIAGNOSIS — N400 Enlarged prostate without lower urinary tract symptoms: Secondary | ICD-10-CM | POA: Diagnosis not present

## 2012-06-15 DIAGNOSIS — N401 Enlarged prostate with lower urinary tract symptoms: Secondary | ICD-10-CM | POA: Diagnosis not present

## 2012-06-15 DIAGNOSIS — N320 Bladder-neck obstruction: Secondary | ICD-10-CM | POA: Diagnosis not present

## 2012-06-15 DIAGNOSIS — R39198 Other difficulties with micturition: Secondary | ICD-10-CM | POA: Diagnosis not present

## 2012-06-29 DIAGNOSIS — N401 Enlarged prostate with lower urinary tract symptoms: Secondary | ICD-10-CM | POA: Diagnosis not present

## 2012-06-29 DIAGNOSIS — C801 Malignant (primary) neoplasm, unspecified: Secondary | ICD-10-CM | POA: Diagnosis not present

## 2012-07-06 DIAGNOSIS — R3915 Urgency of urination: Secondary | ICD-10-CM | POA: Diagnosis not present

## 2012-07-06 DIAGNOSIS — R3911 Hesitancy of micturition: Secondary | ICD-10-CM | POA: Diagnosis not present

## 2012-07-06 DIAGNOSIS — N401 Enlarged prostate with lower urinary tract symptoms: Secondary | ICD-10-CM | POA: Diagnosis not present

## 2012-07-06 DIAGNOSIS — C801 Malignant (primary) neoplasm, unspecified: Secondary | ICD-10-CM | POA: Diagnosis not present

## 2012-07-06 DIAGNOSIS — N400 Enlarged prostate without lower urinary tract symptoms: Secondary | ICD-10-CM | POA: Diagnosis not present

## 2012-07-06 DIAGNOSIS — I10 Essential (primary) hypertension: Secondary | ICD-10-CM | POA: Diagnosis not present

## 2012-07-06 DIAGNOSIS — R351 Nocturia: Secondary | ICD-10-CM | POA: Diagnosis not present

## 2012-07-06 DIAGNOSIS — C679 Malignant neoplasm of bladder, unspecified: Secondary | ICD-10-CM | POA: Diagnosis not present

## 2012-07-06 DIAGNOSIS — Z79899 Other long term (current) drug therapy: Secondary | ICD-10-CM | POA: Diagnosis not present

## 2012-07-17 DIAGNOSIS — C679 Malignant neoplasm of bladder, unspecified: Secondary | ICD-10-CM | POA: Diagnosis not present

## 2012-07-18 DIAGNOSIS — N401 Enlarged prostate with lower urinary tract symptoms: Secondary | ICD-10-CM | POA: Diagnosis not present

## 2012-07-18 DIAGNOSIS — N320 Bladder-neck obstruction: Secondary | ICD-10-CM | POA: Diagnosis not present

## 2012-07-18 DIAGNOSIS — N329 Bladder disorder, unspecified: Secondary | ICD-10-CM | POA: Diagnosis not present

## 2012-07-18 DIAGNOSIS — C679 Malignant neoplasm of bladder, unspecified: Secondary | ICD-10-CM | POA: Diagnosis not present

## 2012-07-18 DIAGNOSIS — K449 Diaphragmatic hernia without obstruction or gangrene: Secondary | ICD-10-CM | POA: Diagnosis not present

## 2012-07-21 DIAGNOSIS — C679 Malignant neoplasm of bladder, unspecified: Secondary | ICD-10-CM | POA: Diagnosis not present

## 2012-07-21 DIAGNOSIS — C689 Malignant neoplasm of urinary organ, unspecified: Secondary | ICD-10-CM | POA: Diagnosis not present

## 2012-07-21 DIAGNOSIS — N401 Enlarged prostate with lower urinary tract symptoms: Secondary | ICD-10-CM | POA: Diagnosis not present

## 2012-08-03 DIAGNOSIS — C670 Malignant neoplasm of trigone of bladder: Secondary | ICD-10-CM | POA: Diagnosis not present

## 2012-08-08 ENCOUNTER — Other Ambulatory Visit: Payer: Self-pay | Admitting: Urology

## 2012-08-09 ENCOUNTER — Other Ambulatory Visit: Payer: Self-pay | Admitting: Urology

## 2012-08-10 DIAGNOSIS — F411 Generalized anxiety disorder: Secondary | ICD-10-CM | POA: Diagnosis not present

## 2012-08-10 DIAGNOSIS — I10 Essential (primary) hypertension: Secondary | ICD-10-CM | POA: Diagnosis not present

## 2012-08-10 DIAGNOSIS — C679 Malignant neoplasm of bladder, unspecified: Secondary | ICD-10-CM | POA: Diagnosis not present

## 2012-08-14 DIAGNOSIS — N401 Enlarged prostate with lower urinary tract symptoms: Secondary | ICD-10-CM | POA: Diagnosis not present

## 2012-08-14 DIAGNOSIS — R319 Hematuria, unspecified: Secondary | ICD-10-CM | POA: Diagnosis not present

## 2012-08-14 DIAGNOSIS — C679 Malignant neoplasm of bladder, unspecified: Secondary | ICD-10-CM | POA: Diagnosis not present

## 2012-08-14 DIAGNOSIS — C689 Malignant neoplasm of urinary organ, unspecified: Secondary | ICD-10-CM | POA: Diagnosis not present

## 2012-08-17 DIAGNOSIS — R319 Hematuria, unspecified: Secondary | ICD-10-CM | POA: Diagnosis not present

## 2012-08-17 DIAGNOSIS — N401 Enlarged prostate with lower urinary tract symptoms: Secondary | ICD-10-CM | POA: Diagnosis not present

## 2012-08-17 DIAGNOSIS — Z79899 Other long term (current) drug therapy: Secondary | ICD-10-CM | POA: Diagnosis not present

## 2012-08-17 DIAGNOSIS — C689 Malignant neoplasm of urinary organ, unspecified: Secondary | ICD-10-CM | POA: Diagnosis not present

## 2012-08-17 DIAGNOSIS — I10 Essential (primary) hypertension: Secondary | ICD-10-CM | POA: Diagnosis not present

## 2012-08-17 DIAGNOSIS — C679 Malignant neoplasm of bladder, unspecified: Secondary | ICD-10-CM | POA: Diagnosis not present

## 2012-09-05 DIAGNOSIS — N401 Enlarged prostate with lower urinary tract symptoms: Secondary | ICD-10-CM | POA: Diagnosis not present

## 2012-09-05 DIAGNOSIS — C679 Malignant neoplasm of bladder, unspecified: Secondary | ICD-10-CM | POA: Diagnosis not present

## 2012-09-14 ENCOUNTER — Inpatient Hospital Stay (HOSPITAL_COMMUNITY)
Admission: RE | Admit: 2012-09-14 | Discharge: 2012-09-20 | DRG: 655 | Disposition: A | Discharge status: Home Health | Payer: Medicare Other | Source: Ambulatory Visit | Attending: Urology | Admitting: Urology

## 2012-09-14 ENCOUNTER — Encounter (HOSPITAL_COMMUNITY): Payer: Self-pay | Admitting: *Deleted

## 2012-09-14 DIAGNOSIS — K660 Peritoneal adhesions (postprocedural) (postinfection): Secondary | ICD-10-CM | POA: Diagnosis not present

## 2012-09-14 DIAGNOSIS — C779 Secondary and unspecified malignant neoplasm of lymph node, unspecified: Secondary | ICD-10-CM | POA: Diagnosis not present

## 2012-09-14 DIAGNOSIS — C679 Malignant neoplasm of bladder, unspecified: Secondary | ICD-10-CM | POA: Diagnosis not present

## 2012-09-14 DIAGNOSIS — C61 Malignant neoplasm of prostate: Secondary | ICD-10-CM | POA: Diagnosis not present

## 2012-09-14 DIAGNOSIS — I10 Essential (primary) hypertension: Secondary | ICD-10-CM | POA: Diagnosis present

## 2012-09-14 LAB — COMPREHENSIVE METABOLIC PANEL
AST: 20 U/L (ref 0–37)
Albumin: 4.3 g/dL (ref 3.5–5.2)
BUN: 9 mg/dL (ref 6–23)
Calcium: 9.6 mg/dL (ref 8.4–10.5)
Creatinine, Ser: 0.78 mg/dL (ref 0.50–1.35)
Total Bilirubin: 0.5 mg/dL (ref 0.3–1.2)
Total Protein: 7.7 g/dL (ref 6.0–8.3)

## 2012-09-14 LAB — CBC
HCT: 41.5 % (ref 39.0–52.0)
Hemoglobin: 14.1 g/dL (ref 13.0–17.0)
MCH: 31.5 pg (ref 26.0–34.0)
MCV: 92.6 fL (ref 78.0–100.0)
Platelets: 252 10*3/uL (ref 150–400)
RBC: 4.48 MIL/uL (ref 4.22–5.81)
WBC: 9.1 10*3/uL (ref 4.0–10.5)

## 2012-09-14 MED ORDER — LORAZEPAM 1 MG PO TABS
1.0000 mg | ORAL_TABLET | Freq: Once | ORAL | Status: DC
  Filled 2012-09-14: qty 1

## 2012-09-14 MED ORDER — LORAZEPAM 1 MG PO TABS
1.0000 mg | ORAL_TABLET | Freq: Once | ORAL | Status: AC
  Administered 2012-09-14: 1 mg via ORAL

## 2012-09-14 MED ORDER — PEG 3350-KCL-NA BICARB-NACL 420 G PO SOLR
4000.0000 mL | Freq: Once | ORAL | Status: AC
  Administered 2012-09-14: 4000 mL via ORAL

## 2012-09-14 MED ORDER — KCL IN DEXTROSE-NACL 20-5-0.45 MEQ/L-%-% IV SOLN
INTRAVENOUS | Status: DC
  Administered 2012-09-14: 15:00:00 via INTRAVENOUS
  Filled 2012-09-14 (×3): qty 1000

## 2012-09-14 MED ORDER — ALVIMOPAN 12 MG PO CAPS
12.0000 mg | ORAL_CAPSULE | Freq: Once | ORAL | Status: AC
  Administered 2012-09-15: 12 mg via ORAL
  Filled 2012-09-14: qty 1

## 2012-09-14 MED ORDER — PIPERACILLIN-TAZOBACTAM 3.375 G IVPB 30 MIN
3.3750 g | Freq: Once | INTRAVENOUS | Status: AC
  Administered 2012-09-15 (×3): 3.375 g via INTRAVENOUS
  Filled 2012-09-14 (×2): qty 50

## 2012-09-14 NOTE — Anesthesia Preprocedure Evaluation (Addendum)
Anesthesia Evaluation  Patient identified by MRN, date of birth, ID band Patient awake    Reviewed: Allergy & Precautions, H&P , NPO status , Patient's Chart, lab work & pertinent test results  Airway Mallampati: II TM Distance: >3 FB Neck ROM: Full    Dental no notable dental hx.    Pulmonary neg pulmonary ROS,  breath sounds clear to auscultation  Pulmonary exam normal       Cardiovascular hypertension, Rhythm:Regular Rate:Normal     Neuro/Psych negative neurological ROS  negative psych ROS   GI/Hepatic negative GI ROS, Neg liver ROS,   Endo/Other  negative endocrine ROS  Renal/GU negative Renal ROS  negative genitourinary   Musculoskeletal negative musculoskeletal ROS (+)   Abdominal   Peds negative pediatric ROS (+)  Hematology negative hematology ROS (+)   Anesthesia Other Findings   Reproductive/Obstetrics negative OB ROS                          Anesthesia Physical Anesthesia Plan  ASA: II  Anesthesia Plan: General   Post-op Pain Management:    Induction: Intravenous  Airway Management Planned: Oral ETT  Additional Equipment: Arterial line  Intra-op Plan:   Post-operative Plan: Extubation in OR and Possible Post-op intubation/ventilation  Informed Consent: I have reviewed the patients History and Physical, chart, labs and discussed the procedure including the risks, benefits and alternatives for the proposed anesthesia with the patient or authorized representative who has indicated his/her understanding and acceptance.   Dental advisory given  Plan Discussed with: CRNA and Surgeon  Anesthesia Plan Comments:        Anesthesia Quick Evaluation

## 2012-09-14 NOTE — Care Management Note (Addendum)
    Page 1 of 2   09/20/2012     11:45:24 AM   CARE MANAGEMENT NOTE 09/20/2012  Patient:  Benjamin Yang, Benjamin Yang   Account Number:  [id number]  Date Initiated:  09/14/2012  Documentation initiated by:  Lanier Clam  Subjective/Objective Assessment:   ADMITTED W/BLADDER CA.     Action/Plan:   FROM HOME W/SPOUSE.   Anticipated DC Date:  09/20/2012   Anticipated DC Plan:  HOME W HOME HEALTH SERVICES      DC Planning Services  CM consult      Choice offered to / List presented to:  C-1 Patient        HH arranged  HH-1 RN      [facility] agency  [facility]   Status of service:  Completed, signed off Medicare Important Message given?   (If response is "NO", the following Medicare IM given date fields will be blank) Date Medicare IM given:   Date Additional Medicare IM given:    Discharge Disposition:  HOME W HOME HEALTH SERVICES  Per UR Regulation:  Reviewed for med. necessity/level of care/duration of stay  If discussed at Long Length of Stay Meetings, dates discussed:    Comments:  09/20/12 Dinah Lupa RN,BSN NCM [phone] D/ [facility]OME W/AHCHHRN KRISTEN AWARE OF D/C.  09/18/12 Korine Winton RN,BSN NCM [phone] ALREADY HAS HHC AGENCY LIST.REMINDED TO CHOOSE HHC FOR HHRN-OSTOMY INSTRUCTION.AWAIT FINAL HHRN ORDER.POD#3,JP-SEROSANGUINOUS,BM,REG DIET.  09/14/12 Taray Normoyle RN,BSN NCM [phone] FOR PRE OP ROBOTIC CYSTOPROSTATECTOMY,ILEAL CONDUIT/URINARY DIVERSION IN AM.[facility]OC ALREADY FOLLOWING FOR OSTOMY-WILL PROVIDE SUPPLIES,& CONTACT TEL#'S FOR ORDERING.IF HHRN NEEDED CAN ARRANGE IF NEEDED.

## 2012-09-14 NOTE — Consult Note (Addendum)
WOC ostomy consult: Preoperative stoma site selection for ileal conduit  Patient's abdomen observed in the sitting, standing and lying position.  Abdominal rectus muscle palpated.  Patient with crease at umbilicus and wears pants rather low-below umbilicus.  There is no area beneath the umbilicus for successful pouching.  A site is selected slightly above the umbilicus for stoma location. Site selected:  RUQ, 8cm to the right of the umbilicus and 1.5cm above. Site is marked with a skin marking pen and is covered with a thin film, transparent dressing.  Education provided: Patient and wife have been seeking information via the internet.  A site monitored by [facility] Nurses is provided for their information, [email or web address].  An ostomy educational booklet is provided for their use and we will use this post-operatively for teaching and follow-up. Sample pouches provided and benefits of each described.  Patient understands that [facility] Nursing team will be assisting him post discharge. [facility] Nursing Team will follow postoperatively.   Thanks, Ladona Mow, MSN, RN, GNP, [place], CWON-AP [phone])

## 2012-09-14 NOTE — H&P (Signed)
Benjamin Yang is an 77 y.o. male.    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 Paulla Dolly with [facility]. Staging CT and bone scan with only tiny focus of rib uptake not noted on CT. No bulky or large voluem distant disease. He understnads the option of radical cystoprostatecotmy vs. chemoradiation v. noncurative options as expertly outlined by Dr. Paulla Dolly previously.  PMH sig for HTN, Back surgery in 1980s (no deficits). No CV disease. No blood thinners.  Today Benjamin Yang presents for pre-admission prior to his cystectomy tomorrow.  Past Medical History  Diagnosis Date  . Prostatitis   . Hypertension     Past Surgical History  Procedure Laterality Date  . Back surgery      History reviewed. No pertinent family history. Social History:  reports that he has never smoked. He does not have any smokeless tobacco history on file. He reports that  drinks alcohol. He reports that he does not use illicit drugs.  Allergies:  Allergies  Allergen Reactions  . Percocet (Oxycodone-Acetaminophen)     Medications Prior to Admission  Medication Sig Dispense Refill  . amLODipine (NORVASC) 10 MG tablet Take 10 mg by mouth daily.      . ciprofloxacin (CIPRO) 500 MG tablet Take 0.5 tablets (250 mg total) by mouth 2 (two) times daily.  14 tablet  0  . Dutasteride-Tamsulosin HCl (JALYN) 0.5-0.4 MG CAPS Take 1 tablet by mouth as directed.        No results found for this or any previous visit (from the past 48 hour(s)). No results found.  Review of Systems  Constitutional: Negative.   HENT: Negative.   Eyes: Negative.   Respiratory: Negative.   Cardiovascular: Negative.   Gastrointestinal: Negative.   Genitourinary: Negative.   Musculoskeletal: Negative.    Skin: Negative.   Neurological: Negative.   Endo/Heme/Allergies: Negative.   Psychiatric/Behavioral: Negative.     Blood pressure 163/80, pulse 81, temperature 98.3 F (36.8 C), temperature source Oral, resp. rate 20, height 6\' 2"  (1.88 m), SpO2 99.00%. Physical Exam  Constitutional: He is oriented to person, place, and time. He appears well-developed and well-nourished.  HENT:  Head: Normocephalic and atraumatic.  Eyes: EOM are normal. Pupils are equal, round, and reactive to light.  Neck: Normal range of motion. Neck supple.  Cardiovascular: Normal rate and regular rhythm.   Respiratory: Effort normal.  GI: Soft. Bowel sounds are normal.  Genitourinary: Penis normal.  Musculoskeletal: Normal range of motion.  Neurological: He is alert and oriented to person, place, and time.  Skin: Skin is warm and dry.  Psychiatric: He has a normal mood and affect. His behavior is normal. Judgment and thought content normal.     Assessment/Plan   1 - High-Grade Muscle-Invasive Bladder Cancer - We rediscssued the role of radical cystectomy + lymph node dissection with concomitant prostatectomy in male and hysterectomy / oophorectomy in male and ileal conduit urinary diversion with the overall goal of complete surgical excision (negative margins) and better staging / diagnosis. We specifically rediscussed alternatives including chemo-radiation, palliative therapies, and the role of neoadjuvant chemotherapy. We then rediscussed surgical approaches including robotic and open techniques with robotic associated with a shorter convalescence. I showed the patient on their abdomen the approximately 4-6 incision (trocar) sites as well as presumed extraction  sites with robotic approach as well as possible open incision sites. I also showed them potential sites for the ileal conduit and spent significant time explaining the "plumbing" of this with regards to GI and GU tracts and specific risks of diversion  including ureteral stricture. We specifically readdressed that there may be need to alter operative plans according to intraopertive findings including conversion to open procedure. We rediscussed specific peri-operative risks including bleeding, infection, deep vein thrombosis, pulmonary embolism, compartment syndrome, nuropathy / neuropraxia, bowel leak, bowel stricture, heart attack, stroke, death, as well as long-term risks such as non-cure / need for additional therapy and need for imaging and lab based post-op surveillance protocols. We rediscussed typical hospital course of approximately 5-7 day hospitalization, need for peri-operative drains / catheters, and typical post-hospital course with return to most non-strenuous activities by 4 weeks and ability to return to most jobs and more strenuous activity such as exercise by 8 weeks.   After this lengthy and detail discussion, including answering all of the patient's questions to their satisfaction, they have chosen to proceed as scheduled tomorrow. MIVF, Bowel Prep, Entereg, Labs today. NPO p MN   Rayola Everhart 09/14/2012, 12:56 PM

## 2012-09-15 ENCOUNTER — Encounter (HOSPITAL_COMMUNITY)
Admission: RE | Disposition: A | Discharge status: Home Health | Payer: Self-pay | Source: Ambulatory Visit | Attending: Urology

## 2012-09-15 ENCOUNTER — Inpatient Hospital Stay (HOSPITAL_COMMUNITY): Payer: Medicare Other | Admitting: Anesthesiology

## 2012-09-15 ENCOUNTER — Encounter (HOSPITAL_COMMUNITY): Payer: Self-pay | Admitting: Anesthesiology

## 2012-09-15 ENCOUNTER — Encounter (HOSPITAL_COMMUNITY): Payer: Self-pay | Admitting: *Deleted

## 2012-09-15 DIAGNOSIS — C61 Malignant neoplasm of prostate: Secondary | ICD-10-CM | POA: Diagnosis not present

## 2012-09-15 DIAGNOSIS — C679 Malignant neoplasm of bladder, unspecified: Secondary | ICD-10-CM | POA: Diagnosis not present

## 2012-09-15 DIAGNOSIS — C779 Secondary and unspecified malignant neoplasm of lymph node, unspecified: Secondary | ICD-10-CM | POA: Diagnosis not present

## 2012-09-15 DIAGNOSIS — I10 Essential (primary) hypertension: Secondary | ICD-10-CM | POA: Diagnosis not present

## 2012-09-15 DIAGNOSIS — K660 Peritoneal adhesions (postprocedural) (postinfection): Secondary | ICD-10-CM | POA: Diagnosis not present

## 2012-09-15 HISTORY — PX: ROBOTIC ASSISTED LAPAROSCOPIC LYSIS OF ADHESION: SHX6080

## 2012-09-15 HISTORY — PX: CYSTOSCOPY: SHX5120

## 2012-09-15 HISTORY — PX: LYMPHADENECTOMY: SHX5960

## 2012-09-15 HISTORY — PX: ROBOT ASSISTED LAPAROSCOPIC COMPLETE CYSTECT ILEAL CONDUIT: SHX5139

## 2012-09-15 LAB — POCT I-STAT 7, (LYTES, BLD GAS, ICA,H+H)
Acid-base deficit: 2 mmol/L (ref 0.0–2.0)
Bicarbonate: 23.5 mEq/L (ref 20.0–24.0)
Calcium, Ion: 1.16 mmol/L (ref 1.13–1.30)
HCT: 36 % — ABNORMAL LOW (ref 39.0–52.0)
Hemoglobin: 12.2 g/dL — ABNORMAL LOW (ref 13.0–17.0)
pH, Arterial: 7.364 (ref 7.350–7.450)
pO2, Arterial: 155 mmHg — ABNORMAL HIGH (ref 80.0–100.0)

## 2012-09-15 LAB — BASIC METABOLIC PANEL
BUN: 10 mg/dL (ref 6–23)
Chloride: 100 mEq/L (ref 96–112)
GFR calc Af Amer: >90 mL/min (ref >90)
GFR calc non Af Amer: 83 mL/min — ABNORMAL LOW (ref >90)
Potassium: 3.6 mEq/L (ref 3.5–5.1)

## 2012-09-15 LAB — CBC
HCT: 32.9 % — ABNORMAL LOW (ref 39.0–52.0)
MCHC: 34.3 g/dL (ref 30.0–36.0)
Platelets: 245 10*3/uL (ref 150–400)
RDW: 13 % (ref 11.5–15.5)
WBC: 18.2 10*3/uL — ABNORMAL HIGH (ref 4.0–10.5)

## 2012-09-15 LAB — TYPE AND SCREEN
ABO/RH(D): A POS
Antibody Screen: NEGATIVE

## 2012-09-15 SURGERY — ROBOTIC ASSISTED LAPAROSCOPIC COMPLETE CYSTECT ILEAL CONDUIT
Anesthesia: General | Site: Abdomen | Wound class: Clean Contaminated

## 2012-09-15 MED ORDER — ACETAMINOPHEN 10 MG/ML IV SOLN
1000.0000 mg | Freq: Four times a day (QID) | INTRAVENOUS | Status: DC
  Filled 2012-09-15 (×2): qty 100

## 2012-09-15 MED ORDER — SODIUM CHLORIDE 0.9 % IJ SOLN: 9.0000 mL | INTRAMUSCULAR | Status: DC | PRN

## 2012-09-15 MED ORDER — SUFENTANIL CITRATE 50 MCG/ML IV SOLN
INTRAVENOUS | Status: DC | PRN
  Administered 2012-09-15 (×6): 10 ug via INTRAVENOUS
  Administered 2012-09-15: 20 ug via INTRAVENOUS
  Administered 2012-09-15 (×4): 10 ug via INTRAVENOUS

## 2012-09-15 MED ORDER — ACETAMINOPHEN 10 MG/ML IV SOLN: 1000.0000 mg | Freq: Once | INTRAVENOUS | Status: DC

## 2012-09-15 MED ORDER — NEOSTIGMINE METHYLSULFATE 1 MG/ML IJ SOLN
INTRAMUSCULAR | Status: DC | PRN
Start: 2012-09-15 — End: 2012-09-15
  Administered 2012-09-15: 2 mg via INTRAVENOUS

## 2012-09-15 MED ORDER — HYDROMORPHONE HCL PF 1 MG/ML IJ SOLN
0.2500 mg | INTRAMUSCULAR | Status: DC | PRN
  Administered 2012-09-15: 0.5 mg via INTRAVENOUS

## 2012-09-15 MED ORDER — NALOXONE HCL 0.4 MG/ML IJ SOLN: 0.4000 mg | INTRAMUSCULAR | Status: DC | PRN

## 2012-09-15 MED ORDER — FENTANYL CITRATE 0.05 MG/ML IJ SOLN
INTRAMUSCULAR | Status: DC | PRN
  Administered 2012-09-15: 25 ug via INTRAVENOUS

## 2012-09-15 MED ORDER — BUPIVACAINE 0.25 % ON-Q PUMP SINGLE CATH 300ML
300.0000 mL | INJECTION | Status: DC
  Administered 2012-09-15: 300 mL
  Filled 2012-09-15: qty 300

## 2012-09-15 MED ORDER — BUPIVACAINE ON-Q PAIN PUMP (FOR ORDER SET NO CHG)
INJECTION | Status: AC
  Filled 2012-09-15: qty 1

## 2012-09-15 MED ORDER — DIPHENHYDRAMINE HCL 12.5 MG/5ML PO ELIX: 12.5000 mg | ORAL_SOLUTION | Freq: Four times a day (QID) | ORAL | Status: DC | PRN

## 2012-09-15 MED ORDER — HYDROMORPHONE 0.3 MG/ML IV SOLN
INTRAVENOUS | Status: DC
  Administered 2012-09-15: 0.6 mg via INTRAVENOUS
  Administered 2012-09-15: 0.3 mg via INTRAVENOUS
  Administered 2012-09-16: 1.5 mg via INTRAVENOUS
  Administered 2012-09-16: 3.6 mg via INTRAVENOUS
  Administered 2012-09-16: 14:00:00 via INTRAVENOUS
  Administered 2012-09-16: 1.8 mg via INTRAVENOUS
  Administered 2012-09-16: 0.9 mg via INTRAVENOUS
  Administered 2012-09-16: 1.8 mg via INTRAVENOUS
  Administered 2012-09-17: 11:00:00 via INTRAVENOUS
  Administered 2012-09-17 (×2): 1.8 mg via INTRAVENOUS
  Administered 2012-09-17: 3.48 mg via INTRAVENOUS
  Filled 2012-09-15 (×3): qty 25

## 2012-09-15 MED ORDER — ACETAMINOPHEN 10 MG/ML IV SOLN
1000.0000 mg | Freq: Four times a day (QID) | INTRAVENOUS | Status: AC
  Administered 2012-09-15 – 2012-09-16 (×4): 1000 mg via INTRAVENOUS
  Filled 2012-09-15 (×4): qty 100

## 2012-09-15 MED ORDER — PIPERACILLIN SOD-TAZOBACTAM SO 2.25 (2-0.25) G IV SOLR
3.3750 g | Freq: Three times a day (TID) | INTRAVENOUS | Status: DC
  Filled 2012-09-15: qty 3.38

## 2012-09-15 MED ORDER — ONDANSETRON HCL 4 MG/2ML IJ SOLN
INTRAMUSCULAR | Status: DC | PRN
  Administered 2012-09-15 (×2): 2 mg via INTRAVENOUS
  Administered 2012-09-15: 4 mg via INTRAVENOUS

## 2012-09-15 MED ORDER — MIDAZOLAM HCL 5 MG/5ML IJ SOLN
INTRAMUSCULAR | Status: DC | PRN
  Administered 2012-09-15: 2 mg via INTRAVENOUS
  Administered 2012-09-15 (×2): 1 mg via INTRAVENOUS

## 2012-09-15 MED ORDER — DOCUSATE SODIUM 100 MG PO CAPS
100.0000 mg | ORAL_CAPSULE | Freq: Two times a day (BID) | ORAL | Status: DC
  Administered 2012-09-15 – 2012-09-18 (×6): 100 mg via ORAL
  Filled 2012-09-15 (×12): qty 1

## 2012-09-15 MED ORDER — CISATRACURIUM BESYLATE (PF) 10 MG/5ML IV SOLN
INTRAVENOUS | Status: DC | PRN
  Administered 2012-09-15: 10 mg via INTRAVENOUS
  Administered 2012-09-15: 4 mg via INTRAVENOUS
  Administered 2012-09-15 (×2): 10 mg via INTRAVENOUS
  Administered 2012-09-15: 2 mg via INTRAVENOUS

## 2012-09-15 MED ORDER — 0.9 % SODIUM CHLORIDE (POUR BTL) OPTIME
TOPICAL | Status: DC | PRN
  Administered 2012-09-15: 2000 mL

## 2012-09-15 MED ORDER — STERILE WATER FOR IRRIGATION IR SOLN
Status: DC | PRN
  Administered 2012-09-15: 3000 mL

## 2012-09-15 MED ORDER — AMLODIPINE BESYLATE 10 MG PO TABS
10.0000 mg | ORAL_TABLET | Freq: Every day | ORAL | Status: DC
  Administered 2012-09-15 – 2012-09-20 (×6): 10 mg via ORAL
  Filled 2012-09-15 (×7): qty 1

## 2012-09-15 MED ORDER — LACTATED RINGERS IV SOLN
INTRAVENOUS | Status: DC | PRN
  Administered 2012-09-15 (×4): via INTRAVENOUS

## 2012-09-15 MED ORDER — ONDANSETRON HCL 4 MG/2ML IJ SOLN
4.0000 mg | INTRAMUSCULAR | Status: DC | PRN
  Administered 2012-09-17 – 2012-09-19 (×6): 4 mg via INTRAVENOUS
  Filled 2012-09-15 (×6): qty 2

## 2012-09-15 MED ORDER — HEPARIN SODIUM (PORCINE) 5000 UNIT/ML IJ SOLN
5000.0000 [IU] | Freq: Three times a day (TID) | INTRAMUSCULAR | Status: DC
  Administered 2012-09-16 – 2012-09-20 (×12): 5000 [IU] via SUBCUTANEOUS
  Filled 2012-09-15 (×15): qty 1

## 2012-09-15 MED ORDER — DEXAMETHASONE SODIUM PHOSPHATE 10 MG/ML IJ SOLN
INTRAMUSCULAR | Status: DC | PRN
  Administered 2012-09-15: 10 mg via INTRAVENOUS

## 2012-09-15 MED ORDER — ACETAMINOPHEN 10 MG/ML IV SOLN
INTRAVENOUS | Status: DC | PRN
  Administered 2012-09-15: 1000 mg via INTRAVENOUS

## 2012-09-15 MED ORDER — EPHEDRINE SULFATE 50 MG/ML IJ SOLN
INTRAMUSCULAR | Status: DC | PRN
  Administered 2012-09-15 (×3): 5 mg via INTRAVENOUS

## 2012-09-15 MED ORDER — LACTATED RINGERS IV SOLN
INTRAVENOUS | Status: DC | PRN
  Administered 2012-09-15 (×3): via INTRAVENOUS

## 2012-09-15 MED ORDER — LACTATED RINGERS IR SOLN
Status: DC | PRN
  Administered 2012-09-15: 17:00:00
  Administered 2012-09-15: 1000 mL

## 2012-09-15 MED ORDER — PROMETHAZINE HCL 25 MG/ML IJ SOLN
6.2500 mg | INTRAMUSCULAR | Status: DC | PRN
  Administered 2012-09-15: 12.5 mg via INTRAVENOUS

## 2012-09-15 MED ORDER — DIPHENHYDRAMINE HCL 50 MG/ML IJ SOLN: 12.5000 mg | Freq: Four times a day (QID) | INTRAMUSCULAR | Status: DC | PRN

## 2012-09-15 MED ORDER — SODIUM CHLORIDE 0.9 % IR SOLN
Status: DC | PRN
  Administered 2012-09-15: 3000 mL via INTRAVESICAL

## 2012-09-15 MED ORDER — PIPERACILLIN-TAZOBACTAM 3.375 G IVPB
3.3750 g | Freq: Three times a day (TID) | INTRAVENOUS | Status: AC
  Administered 2012-09-15 – 2012-09-17 (×6): 3.375 g via INTRAVENOUS
  Filled 2012-09-15 (×6): qty 50

## 2012-09-15 MED ORDER — GLYCOPYRROLATE 0.2 MG/ML IJ SOLN
INTRAMUSCULAR | Status: DC | PRN
  Administered 2012-09-15: .6 mg via INTRAVENOUS

## 2012-09-15 MED ORDER — LIDOCAINE HCL (CARDIAC) 20 MG/ML IV SOLN
INTRAVENOUS | Status: DC | PRN
  Administered 2012-09-15: 75 mg via INTRAVENOUS

## 2012-09-15 MED ORDER — SENNA 8.6 MG PO TABS
1.0000 | ORAL_TABLET | Freq: Two times a day (BID) | ORAL | Status: DC
  Administered 2012-09-15 – 2012-09-17 (×5): 8.6 mg via ORAL
  Filled 2012-09-15 (×6): qty 1

## 2012-09-15 MED ORDER — ACETAMINOPHEN 10 MG/ML IV SOLN
1000.0000 mg | Freq: Four times a day (QID) | INTRAVENOUS | Status: DC
  Filled 2012-09-15 (×4): qty 100

## 2012-09-15 MED ORDER — KCL IN DEXTROSE-NACL 20-5-0.45 MEQ/L-%-% IV SOLN
INTRAVENOUS | Status: DC
  Administered 2012-09-15 – 2012-09-16 (×2): via INTRAVENOUS
  Filled 2012-09-15 (×3): qty 1000

## 2012-09-15 SURGICAL SUPPLY — 98 items
ADH SKN CLS APL DERMABOND .7 (GAUZE/BANDAGES/DRESSINGS) ×6
APL ESCP 34 STRL LF DISP (HEMOSTASIS)
APPLICATOR COTTON TIP 6IN STRL (MISCELLANEOUS) ×2 IMPLANT
APPLICATOR SURGIFLO ENDO (HEMOSTASIS) ×1 IMPLANT
BAG SPEC RTRVL LRG 6X4 10 (ENDOMECHANICALS)
BAG URO CATCHER STRL LF (DRAPE) ×4 IMPLANT
BLADE SURG SZ10 CARB STEEL (BLADE) IMPLANT
CANISTER SUCT LVC 12 LTR MEDI- (MISCELLANEOUS) ×4 IMPLANT
CANISTER SUCTION 2500CC (MISCELLANEOUS) ×4 IMPLANT
CATH FOLEY 2WAY SLVR  5CC 16FR (CATHETERS) ×2
CATH FOLEY 2WAY SLVR 5CC 16FR (CATHETERS) ×4 IMPLANT
CATH ROBINSON RED A/P 16FR (CATHETERS) ×2 IMPLANT
CHLORAPREP W/TINT 26ML (MISCELLANEOUS) ×4 IMPLANT
CLIP LIGATING HEM O LOK PURPLE (MISCELLANEOUS) ×4 IMPLANT
CLIP LIGATING HEMO LOK XL GOLD (MISCELLANEOUS) ×5 IMPLANT
CLOTH BEACON ORANGE TIMEOUT ST (SAFETY) ×4 IMPLANT
CORD HIGH FREQUENCY UNIPOLAR (ELECTROSURGICAL) ×4 IMPLANT
COVER SURGICAL LIGHT HANDLE (MISCELLANEOUS) ×2 IMPLANT
COVER TIP SHEARS 8 DVNC (MISCELLANEOUS) ×3 IMPLANT
COVER TIP SHEARS 8MM DA VINCI (MISCELLANEOUS) ×1
DECANTER SPIKE VIAL GLASS SM (MISCELLANEOUS) ×3 IMPLANT
DERMABOND ADVANCED (GAUZE/BANDAGES/DRESSINGS) ×2
DERMABOND ADVANCED .7 DNX12 (GAUZE/BANDAGES/DRESSINGS) ×6 IMPLANT
DRAIN CHANNEL 15F RND FF 3/16 (WOUND CARE) ×2 IMPLANT
DRAPE CAMERA ARM DAVINCI SIROB (DRAPES) ×2 IMPLANT
DRAPE CAMERA CLOSED 9X96 (DRAPES) ×4 IMPLANT
DRAPE TABLE BACK 44X90 PK DISP (DRAPES) ×1 IMPLANT
DRAPE WARM FLUID 44X44 (DRAPE) ×1 IMPLANT
DRSG TEGADERM 6X8 (GAUZE/BANDAGES/DRESSINGS) ×4 IMPLANT
ELECT REM PT RETURN 9FT ADLT (ELECTROSURGICAL) ×4
ELECTRODE REM PT RTRN 9FT ADLT (ELECTROSURGICAL) ×3 IMPLANT
EVACUATOR DRAINAGE 7X20 100CC (MISCELLANEOUS) ×2 IMPLANT
EVACUATOR SILICONE 100CC (MISCELLANEOUS)
GAUZE VASELINE 3X9 (GAUZE/BANDAGES/DRESSINGS) ×1 IMPLANT
GLOVE BIO SURGEON STRL SZ 6.5 (GLOVE) ×1 IMPLANT
GLOVE BIOGEL M STRL SZ7.5 (GLOVE) ×12 IMPLANT
GLOVE BIOGEL PI IND STRL 8 (GLOVE) IMPLANT
GLOVE BIOGEL PI INDICATOR 8 (GLOVE) ×1
GOWN PREVENTION PLUS LG XLONG (DISPOSABLE) ×1 IMPLANT
GOWN STRL NON-REIN LRG LVL3 (GOWN DISPOSABLE) ×8 IMPLANT
GUIDEWIRE ANG ZIPWIRE 038X150 (WIRE) ×4 IMPLANT
HOLDER FOLEY CATH W/STRAP (MISCELLANEOUS) ×3 IMPLANT
KIT PROCEDURE DA VINCI SI (MISCELLANEOUS) ×1
KIT PROCEDURE DVNC SI (MISCELLANEOUS) ×3 IMPLANT
LOOP MINI RED (MISCELLANEOUS) ×3 IMPLANT
LOOP VESSEL MAXI BLUE (MISCELLANEOUS) ×3 IMPLANT
MANIFOLD NEPTUNE II (INSTRUMENTS) ×3 IMPLANT
NDL INSUFFLATION 14GA 120MM (NEEDLE) IMPLANT
NEEDLE INSUFFLATION 14GA 120MM (NEEDLE) ×4 IMPLANT
PACK CYSTO (CUSTOM PROCEDURE TRAY) ×4 IMPLANT
PACK ROBOT UROLOGY CUSTOM (CUSTOM PROCEDURE TRAY) ×4 IMPLANT
POSITIONER SURGICAL ARM (MISCELLANEOUS) ×6 IMPLANT
POUCH ENDO CATCH II 15MM (MISCELLANEOUS) ×4 IMPLANT
POUCH SPECIMEN RETRIEVAL 10MM (ENDOMECHANICALS) IMPLANT
PUMP ON Q 270MLX5ML/HR (PAIN MANAGEMENT) ×2 IMPLANT
RELOAD GOLD (STAPLE) ×3 IMPLANT
RELOAD GREEN (STAPLE) ×7 IMPLANT
RELOAD LINEAR CUT PROX 55 BLUE (ENDOMECHANICALS) IMPLANT
RELOAD STAPLE 55 3.8 BLU REG (ENDOMECHANICALS) IMPLANT
RELOAD WHITE ECR60W (STAPLE) ×13 IMPLANT
SET TUBE IRRIG SUCTION NO TIP (IRRIGATION / IRRIGATOR) ×4 IMPLANT
SOLUTION ELECTROLUBE (MISCELLANEOUS) ×4 IMPLANT
SPONGE GAUZE 4X4 12PLY (GAUZE/BANDAGES/DRESSINGS) ×2 IMPLANT
SPONGE LAP 18X18 X RAY DECT (DISPOSABLE) ×2 IMPLANT
SPONGE LAP 4X18 X RAY DECT (DISPOSABLE) ×4 IMPLANT
STAPLE ECHEON FLEX 60 POW ENDO (STAPLE) ×4 IMPLANT
STAPLER GUN LINEAR PROX 60 (STAPLE) IMPLANT
STAPLER PROXIMATE 55 BLUE (STAPLE) IMPLANT
STENT CONTOUR 7FRX24X.038 (STENTS) ×6 IMPLANT
STENT SET URETHERAL LEFT 7FR (STENTS) ×1 IMPLANT
STENT SET URETHERAL RIGHT 7FR (STENTS) ×2 IMPLANT
SURGIFLO W/THROMBIN 8M KIT (HEMOSTASIS) ×2 IMPLANT
SUT ETHILON 3 0 PS 1 (SUTURE) ×4 IMPLANT
SUT PDS AB 0 CTX 36 PDP370T (SUTURE) ×12 IMPLANT
SUT SILK 3 0 SH 30 (SUTURE) ×31 IMPLANT
SUT SILK 3 0 SH CR/8 (SUTURE) ×2 IMPLANT
SUT VIC AB 2-0 SH 27 (SUTURE) ×4
SUT VIC AB 2-0 SH 27X BRD (SUTURE) ×1 IMPLANT
SUT VIC AB 2-0 UR5 27 (SUTURE) ×6 IMPLANT
SUT VIC AB 2-0 UR6 27 (SUTURE) ×4 IMPLANT
SUT VIC AB 3-0 SH 27 (SUTURE) ×20
SUT VIC AB 3-0 SH 27X BRD (SUTURE) ×6 IMPLANT
SUT VIC AB 3-0 SH 27XBRD (SUTURE) IMPLANT
SUT VIC AB 4-0 PS2 18 (SUTURE) ×6 IMPLANT
SUT VIC AB 4-0 RB1 27 (SUTURE) ×24
SUT VIC AB 4-0 RB1 27XBRD (SUTURE) IMPLANT
SUT VICRYL 0 UR6 27IN ABS (SUTURE) ×12 IMPLANT
SUT VLOC BARB 180 ABS3/0GR12 (SUTURE) ×4
SUTURE VLOC BRB 180 ABS3/0GR12 (SUTURE) IMPLANT
SYSTEM UROSTOMY GENTLE TOUCH (WOUND CARE) ×4 IMPLANT
TAPE CLOTH SURG 4X10 WHT LF (GAUZE/BANDAGES/DRESSINGS) ×2 IMPLANT
TROCAR BLADELESS 15MM (ENDOMECHANICALS) ×3 IMPLANT
TROCAR BLADELESS OPT 12M 150M (ENDOMECHANICALS) ×1 IMPLANT
TROCAR XCEL 12X100 BLDLESS (ENDOMECHANICALS) ×4 IMPLANT
TUBING CONNECTING 10 (TUBING) ×2 IMPLANT
URINEMETER 200ML W/220 (MISCELLANEOUS) ×3 IMPLANT
WATER STERILE IRR 1500ML POUR (IV SOLUTION) ×4 IMPLANT
YANKAUER SUCT BULB TIP 10FT TU (MISCELLANEOUS) IMPLANT

## 2012-09-15 NOTE — Anesthesia Procedure Notes (Signed)
Procedure Name: Intubation Date/Time: 09/15/2012 8:05 AM Performed by: Edison Pace Pre-anesthesia Checklist: Patient identified, Emergency Drugs available, Suction available, Patient being monitored and Timeout performed Patient Re-evaluated:Patient Re-evaluated prior to inductionOxygen Delivery Method: Circle system utilized Preoxygenation: Pre-oxygenation with 100% oxygen Intubation Type: IV induction and Cricoid Pressure applied Ventilation: Mask ventilation without difficulty Grade View: Grade II Tube type: Subglottic suction tube Tube size: 7.5 mm Number of attempts: 1 Airway Equipment and Method: Stylet Placement Confirmation: ETT inserted through vocal cords under direct vision,  positive ETCO2 and breath sounds checked- equal and bilateral Secured at: 21 cm Tube secured with: Tape Dental Injury: Teeth and Oropharynx as per pre-operative assessment

## 2012-09-15 NOTE — Anesthesia Postprocedure Evaluation (Signed)
  Anesthesia Post-op Note  Patient: Benjamin Yang  Procedure(s) Performed: Procedure(s) (LRB): Robotic Cystoprostatectomy, Open Ileal Conduit Diversion,  Indocyanine Green Dye Injection into Bladder  (N/A)  Bilateral Pelvic Lymph Node Dissection (Bilateral) CYSTOSCOPY (N/A) ROBOTIC ASSISTED LAPAROSCOPIC LYSIS OF ADHESION (N/A)  Patient Location: PACU  Anesthesia Type: General  Level of Consciousness: awake and alert   Airway and Oxygen Therapy: Patient Spontanous Breathing  Post-op Pain: mild  Post-op Assessment: Post-op Vital signs reviewed, Patient's Cardiovascular Status Stable, Respiratory Function Stable, Patent Airway and No signs of Nausea or vomiting  Last Vitals:  Filed Vitals:   09/15/12 0638  BP: 142/74  Pulse: 83  Temp: 36.4 C  Resp: 20    Post-op Vital Signs: stable   Complications: No apparent anesthesia complications

## 2012-09-15 NOTE — Brief Op Note (Signed)
09/14/2012 - 09/15/2012  3:22 PM  PATIENT:  Benjamin Yang  77 y.o. male  PRE-OPERATIVE DIAGNOSIS:  BLADDER CANCER  POST-OPERATIVE DIAGNOSIS:  BLADDER CANCER  PROCEDURE:  Procedure(s) with comments: Robotic Cystoprostatectomy, Open Ileal Conduit Diversion,  Indocyanine Green Dye Injection into Bladder  (N/A) - Robotic Cystoprostatectomy, Bilateral Pelvic Lymph Node Dissection, Open Ileal Conduit Diversion, Cysto, Indocyanine Green Dye Injection into Bladder    Bilateral Pelvic Lymph Node Dissection (Bilateral) CYSTOSCOPY (N/A) ROBOTIC ASSISTED LAPAROSCOPIC LYSIS OF ADHESION (N/A)  SURGEON:  Surgeon(s) and Role:    * Sebastian Ache, MD - Primary  PHYSICIAN ASSISTANT: Lujean Rave, PA  ASSISTANTS: Lujean Rave, PA  ANESTHESIA:   local and general  EBL:  Total I/O In: 5600 [I.V.:5600] Out: 875 [Urine:425; Blood:450]  BLOOD ADMINISTERED:none  DRAINS: 1 - JP to bulb suction, 2 - Urostomy to gravity drainage with Bilateral Bander Stents (Rt red, Lt blue)   LOCAL MEDICATIONS USED:  MARCAINE     SPECIMEN:  Source of Specimen:  1 - Cystoprostatecotmy, 2 - Rt and Left ext + int iliac lymph nodes, 3 - Rt and Lt Obturator lymph nodes  DISPOSITION OF SPECIMEN:  PATHOLOGY  COUNTS:  YES  TOURNIQUET:  * No tourniquets in log *  DICTATION: .Other Dictation: Dictation Number [id number]  PLAN OF CARE: Admit to inpatient   PATIENT DISPOSITION:  PACU - hemodynamically stable.   Delay start of Pharmacological VTE agent (>24hrs) due to surgical blood loss or risk of bleeding: yes

## 2012-09-15 NOTE — Transfer of Care (Signed)
Immediate Anesthesia Transfer of Care Note  Patient: Benjamin Yang  Procedure(s) Performed: Procedure(s) with comments: Robotic Cystoprostatectomy, Open Ileal Conduit Diversion,  Indocyanine Green Dye Injection into Bladder  (N/A) - Robotic Cystoprostatectomy, Bilateral Pelvic Lymph Node Dissection, Open Ileal Conduit Diversion, Cysto, Indocyanine Green Dye Injection into Bladder    Bilateral Pelvic Lymph Node Dissection (Bilateral) CYSTOSCOPY (N/A) ROBOTIC ASSISTED LAPAROSCOPIC LYSIS OF ADHESION (N/A)  Patient Location: PACU  Anesthesia Type:General  Level of Consciousness: awake, oriented, patient cooperative, lethargic and responds to stimulation  Airway & Oxygen Therapy: Patient Spontanous Breathing and Patient connected to face mask oxygen  Post-op Assessment: Report given to PACU RN, Post -op Vital signs reviewed and stable and Patient moving all extremities  Post vital signs: Reviewed and stable  Complications: No apparent anesthesia complications

## 2012-09-15 NOTE — Preoperative (Addendum)
Beta Blockers   Reason not to administer Beta Blockers:Not Applicable 

## 2012-09-15 NOTE — Interval H&P Note (Signed)
History and Physical Interval Note:  09/15/2012 6:05 AM  S: Tollerated bowel prep overnight. Has potential urostomy site marked. PRe-o labs look good with Cr <1 and Hgb >12.  Benjamin Yang  has presented today for surgery, with the diagnosis of BLADDER CANCER  The various methods of treatment have been discussed with the patient and family. After consideration of risks, benefits and other options for treatment, the patient has consented to  Procedure(s) with comments: Robotic Cystoprostatectomy, Open Ileal Conduit Diversion,  Indocyanine Green Dye Injection into Bladder  (N/A) - Robotic Cystoprostatectomy, Bilateral Pelvic Lymph Node Dissection, Open Ileal Conduit Diversion, Cysto, Indocyanine Green Dye Injection into Bladder    Bilateral Pelvic Lymph Node Dissection (Bilateral) CYSTOSCOPY (N/A) as a surgical intervention .  The patient's history has been reviewed, patient examined, no change in status, stable for surgery.  I have reviewed the patient's chart and labs.  Questions were answered to the patient's satisfaction.     [name]

## 2012-09-16 LAB — CBC
HCT: 32.4 % — ABNORMAL LOW (ref 39.0–52.0)
Hemoglobin: 11.1 g/dL — ABNORMAL LOW (ref 13.0–17.0)
MCHC: 34.3 g/dL (ref 30.0–36.0)
RDW: 13 % (ref 11.5–15.5)
WBC: 10.9 10*3/uL — ABNORMAL HIGH (ref 4.0–10.5)

## 2012-09-16 LAB — BASIC METABOLIC PANEL
Chloride: 98 mEq/L (ref 96–112)
GFR calc Af Amer: >90 mL/min (ref >90)
GFR calc non Af Amer: 82 mL/min — ABNORMAL LOW (ref >90)
Glucose, Bld: 203 mg/dL — ABNORMAL HIGH (ref 70–99)
Potassium: 4.5 mEq/L (ref 3.5–5.1)
Sodium: 133 mEq/L — ABNORMAL LOW (ref 135–145)

## 2012-09-16 MED ORDER — LORAZEPAM 0.5 MG PO TABS
0.5000 mg | ORAL_TABLET | Freq: Three times a day (TID) | ORAL | Status: DC | PRN
  Administered 2012-09-16 – 2012-09-17 (×2): 0.5 mg via ORAL
  Filled 2012-09-16 (×2): qty 1

## 2012-09-16 MED ORDER — BIOTENE DRY MOUTH MT LIQD
15.0000 mL | Freq: Two times a day (BID) | OROMUCOSAL | Status: DC
  Administered 2012-09-16 – 2012-09-20 (×5): 15 mL via OROMUCOSAL

## 2012-09-16 MED ORDER — SODIUM CHLORIDE 0.9 % IV SOLN
INTRAVENOUS | Status: DC
  Administered 2012-09-16: 17:00:00 via INTRAVENOUS
  Administered 2012-09-17: 1000 mL via INTRAVENOUS
  Administered 2012-09-17: 08:00:00 via INTRAVENOUS

## 2012-09-16 MED ORDER — ALVIMOPAN 12 MG PO CAPS
12.0000 mg | ORAL_CAPSULE | Freq: Two times a day (BID) | ORAL | Status: DC
  Administered 2012-09-16 – 2012-09-17 (×4): 12 mg via ORAL
  Filled 2012-09-16 (×7): qty 1

## 2012-09-16 NOTE — Op Note (Signed)
Benjamin Yang               ACCOUNT NO.:  [id number]  MEDICAL RECORD NO.:  [id number]  LOCATION:  1222                         FACILITY:  [facility]  PHYSICIAN:  Benjamin Ache, MD     DATE OF BIRTH:  02-28-1936  DATE OF PROCEDURE:  09/15/2012 DATE OF DISCHARGE:                              OPERATIVE REPORT   PREOPERATIVE DIAGNOSIS:  Muscle-invasive bladder cancer.  PROCEDURE: 1. Cystoscopy with tumor dye injection. 2. Robotic extensive adhesiolysis. 3. Robotic-assisted cystoprostatectomy with ileal conduit urinary     diversion. 4. Insertion of prefascial pain pump.  ASSISTANT:  Benjamin Leisure, PA.  ESTIMATED BLOOD LOSS:  250 mL.  COMPLICATIONS:  None.  SPECIMENS: 1. Right external and internal iliac lymph nodes, hyperfluorescent. 2. Right obturator lymph nodes. 3. Left external/internal iliac lymph nodes. 4. Left obturator lymph nodes. 5. Right distal ureteral margin. 6. Left distal ureteral margin. 7. Cystoprostatectomy.  DRAINS: 1. Benjamin Yang drain bulb suction at the left lower quadrant. 2. Urostomy to gravity drainage with bilateral Bander stents right     red, left blue.  FINDINGS: 1. Extensive adhesions of the descending sigmoid colon. 2. Possible prior diverticulitis. 3. Bilateral single ureters. 4. Right external and internal obturator group with mild fluorescence. 5. Papillary-appearing tissue in the prostatic fossa and right bladder     neck consistent with known diagnosis of bladder cancer.  INDICATION:  Benjamin Yang is a pleasant 77 year old gentleman with history of high-grade muscle-invasive bladder cancer including prostatic stromal invasion and based on transurethral resection of the prostate by Benjamin Yang and confirmed on dedicated biopsies.  The patient was referred for consideration of cystectomy. Options were discussed including this as well as bladder-sparing protocol including chemoradiation.  He adamantly wished to proceed  with cystoprostatectomy with ileal conduit urinary diversion.  Informed consent was obtained and placed in medical record.  PROCEDURE IN DETAIL:  The patient being Benjamin Yang, procedure being cystoprostatectomy was confirmed.  The procedure was carried out.  Time- out was performed.  Intravenous antibiotics were administered.  General endotracheal anesthesia was introduced.  The patient placed into a low lithotomy position.  Sterile field was created by prepping and draping the patient's penis, perineum, and proximal thighs using iodine x3. Next, cystourethroscopy was performed using a 24-French cystoscope with 12-degree offset lens.  Inspection of anterior and posterior urethra revealed prior TURP defect with some papillary tissue in the area of the prostatic fossa and bladder neck, especially on the right side.  There was also large amount of formed clot within the urinary bladder.  The cystoscopic injection needle was then used to inject in a submucosal fashion, approximately 0.5 mL of indocyanine green near the right bladder neck and prostatic fossa.  The cystoscope was then exchanged for a 16-French Foley catheter for urethra to straight drain.  The patient was re-prepped using CHG of the entire abdomen as well as Betadine once again Of the genitalia and he was after further fashioning into the operative table using 3-inch tape over foam and testing of steep trendelenburg position.  He was found to be suitably positioned.  Next, high-flow low pressure pneumoperitoneum was obtained using Veress technique in the supraumbilical midline having passed the  aspiration and drop test.  A 12- mm robotic camera port was placed in the same location.  Laparoscopic examination of the peritoneal cavity revealed significant and very dense- appearing adhesions between the descending and sigmoid area and the left pelvic sidewall, an area of the internal ring, possibly this was  prior diverticulitis.  There were no obvious colonic diverticula noted though. Additional ports were then placed as follows; right paramedian 8-mm robotic port 2 fingerbreadths lateral to the previous stoma site, 8 mm right far lateral robotic port 2 fingerbreadths superior to the anterior iliac spine, a left paramedian 8-mm robotic port, left far lateral 12 mm assist port, and right paramedian 15 mm cyst port at the level of previously marked conduit site.  Robot was docked and passed electronic Test. Initial attention was directed at adhesiolysis.  Very dense attachments were taken down between the area of the ascending colon and sigmoid, and the abdominal wall as was the area of the internal ring.  This was very carefully flipped more towards the midline allowing identification of the external iliac vessels and gonadal vessels.  Again, these landmarks were used to allow further mobilization of the colon medially to allow suitable Access to the left retroperitoneum.  Coursing across the left common iliac, the ureter was encountered.  This was very carefully dissected, sequentially distally for distance of approximately 8 cm to the bladder hiatus and doubly clipped with the proximal being an undyed tack suture. Distal ureteral margin was sent and negative for malignancy.  Ureter was then mobilized proximally to the iliac crossing for additional 6 cm and set aside.  Lateral bladder attachments on the left side were then taken down in the space lateral to the medial umbilical ligament, and pelvic sidewall towards the endopelvic fascia, which was then carefully spread towards the apex of the prostate.  Next, the right ureter was easily identified visibly coursing behind the retroperitoneum across the right common iliac vessels.  This was carefully dissected distal to the iliac vessels towards the area of the bladder hiatus, doubly clipped, and tagged with a dyed Vicryl suture.  Frozen  section was sent negative for malignancy.  Ureter was not extensively mobilized proximal to the iliac vessels.  Right lateral attachments were taken down between the medial umbilical ligament and pelvic side wall, and the endopelvicfascia was also incised and carefully swept lateral.  Posterior dissection was performed by incising between the 2 previous lateral peritoneal incisions creating an inferior peritoneal flap and dissection proceeded within this space anterior to the rectum and peritoneal flap to the posterior to the area of the posterior bladder, vas, seminal vesicles and apex of the prostate.  This exposed bilateral bladder pedicles, which were controlled using sequential vascular load stapler x2 each side.  Anterior bladder attachments were then taken down developing the space of Retzius towards the area of the dorsal venous complex, which was then controlled using vascular load stapler, and proceeded in this plane towards the apex of the prostate anteriorly. Memranous urethral was encountered and ligated.  The catheter was cut after placing extra large clip both the specimen side.  This completely freed up the cystoprostatectomy specimen, which was then placed in EndoCatch bag for later retrieval.  Inspection of the pelvis revealed no injury to the rectum and rectal exam was performed using surgeon's indicator gloved finger and corroborated no injury.  Attention was directed to the pelvic lymphadenectomy first on the left side.  Fiber fatty tissue in the confines of the left  internal iliac artery, left external iliac vein, pelvic side wall were carefully mobilized.  Hemostasis was achieved with clips.  This set aside labbeled external internal iliac lymph nodes. Using fluorescence imaging, there were no obvious sentinel nodes in this area.  Similarly fiber fatty tissue in the confines of the external iliac vein, and pelvic side wall appeared mobilized and stable to obturator  lymph nodes in the left and set aside for permanent pathology. Obturator nerve was uninjured.  Mirror image lymphadenectomy was performed on the right side, however, the right external and internal iliac area were mildly fluorescent consistent with possible sentinel status.  There were no fluorescent lymph nodes on bilateral common iliac or aortic bifurcation area.  The right ureter was then carefully passed behind the mesentery to the right side.  The area of the ileocecal junction was identified and tagged with a silk suture for later identification. Robot was then undocked and an approximately 6-inch incision was made in the midline inferior to the previous camera port with the incision favoring the left side of the umbilicus.  Omni-Tract retractor was then employed. Bowel was carefully packed away exposing the area of the ileocecal junction, which was identified using the previous tack suture.  The segment of terminal ileum 20 cm in length, 20 cm proximal to the ileocecal valve was identified and taken out of continuity using bowel load stapler.  Additional mesenteric mobilization was performed using a vascular stapler x2 distally and x1 proximally taking great care to avoid compromise to the loop and anastomotic bowel segments.  Bowel was brought back in continuity on the peritoneal side using a side-to-side staple anastomosis x2 with bowel load stapler followed by anchoring of anastomotic staple line at the acute angle and then over-sewing the free ends using silk x2, second layer being imbricating.  Mesenteric defect was also reapproximated to prevent internal hernia.  The loop lie in proper position in a retroperitoneal fashion.  Attention was directed to the ureteral anastomosis.  The site for the left ureter was  spatulated for a distance of approximately 8 mm and approximately 8 mm incision was made into the medial aspect of the butt end of the conduit. Conduit segment at this  location was everted using Vicryl x4 for excellent visualization of the circumferential mucosa.  Anchor stitch of 4-0 Vicryl was applied after which the blue-colored bander stent then was carefully passed to the level approximately 22 cm to the anastomotic site.  The posterior wall anastomosis then performed using running 4-0 Vicryl followed by a separate suture line in the anterior wall providing what appeared to be a tension-free, but watertight mucosa anastomosis. Similarly, the right ureter was carefully spatulated at a separate site on the lateral aspect of the butt end of the conduit.  It was then similarly incised, everted.  Anastomosis was performed at this time using a red colored bander stent to 20 cm at the anastomotic site. Anastomosis again appeared to be not under undue tension and water tight.  The conduit site was then carefully dilated to the level of fascia using surgeon's fingers and distal segment was brought through this location after coring out approximately quarter size column of fat between the skin and fascia.  This was anchored in place using 2-0 Vicryl x4.  The abdomen was carefully irrigated, closed suction drain was brought through the previous left lateral and assist port to the area of the pelvis.  It was placed to bulb suction and the fascia was then closed  using figure-of-eight PDS approximately x10.  Next, the surgeon and all scrub personal changed gloves, and the conduit was matured in rosebud technique with 2-0 Vicryl x4 with intervening 3-0 Vicryl x12 resulting in excellent protuberant pink and patent stoma.  Next, the fascial pain pump device was carefully navigated using the suture passer from the inferior aspect of the midline incision.  The 5-inch catheter was used and Ellamae Sia was reapproximated over this using a very loose running 2-0 Vicryl.  All skin sites were then carefully irrigated and then closed to the level of skin using interrupted  stapling.  Stoma appliance was placed.  Sponge and needle counts were correct.  Procedure was then terminated.  The patient tolerated the procedure well.  There were no immediate periprocedural complications.  The patient was taken to postanesthesia care unit in a stable condition.          ______________________________ Benjamin Ache, MD     TM/MEDQ  D:  09/15/2012  T:  09/15/2012  Job:  [id number]

## 2012-09-16 NOTE — Evaluation (Signed)
Physical Therapy Evaluation Patient Details Name: Benjamin Yang MRN: [id number] DOB: 18-Jan-1936 Today's Date: 09/16/2012 Time: 1255-1310 PT Time Calculation (min): 15 min  PT Assessment / Plan / Recommendation History of Present Illness  77 yo male with hx of bladder cancer admitted for cystectomey and iliel conduit urinary diverson 7/18  Clinical Impression  Pt limited by significant "wooziness" and pain.  Expect he will progress with mobility and ambulation as this subsides and likely d/c to home with DME or follow up PT    PT Assessment  Patient needs continued PT services    Follow Up Recommendations  No PT follow up    Does the patient have the potential to tolerate intense rehabilitation      Barriers to Discharge        Equipment Recommendations  None recommended by PT    Recommendations for Other Services     Frequency Min 3X/week    Precautions / Restrictions     Pertinent Vitals/Pain Pt c/o abdominal pain and wooziness      Mobility  Bed Mobility Bed Mobility: Supine to Sit Supine to Sit: 4: Min assist Transfers Transfers: Sit to Stand;Stand to Sit Sit to Stand: 4: Min assist Stand to Sit: 3: Mod assist;4: Min assist Details for Transfer Assistance: need assist for lines and tubes  Ambulation/Gait Ambulation/Gait Assistance: 4: Min assist Ambulation Distance (Feet): 2 Feet Assistive device: Rolling walker Ambulation/Gait Assistance Details: pt pushing up on arms to assist with balance and stability Gait Pattern: Step-to pattern;Decreased step length - right;Decreased step length - left Gait velocity: decreased General Gait Details: Pt with significant discomfort with mobility Stairs: No Wheelchair Mobility Wheelchair Mobility: No    Exercises     PT Diagnosis: Difficulty walking;Acute pain  PT Problem List: Decreased activity tolerance;Decreased mobility;Pain PT Treatment Interventions:       PT Goals(Current goals can be found in the care  plan section) Acute Rehab PT Goals Patient Stated Goal: to sit up PT Goal Formulation: With patient/family Time For Goal Achievement: 09/23/12 Potential to Achieve Goals: Good  Visit Information  Last PT Received On: 09/16/12 Assistance Needed: +1 History of Present Illness: 77 yo male with hx of bladder cancer admitted for cystectomey and iliel conduit urinary diverson 7/18       Prior Functioning  Home Living Family/patient expects to be discharged to:: Private residence Living Arrangements: Spouse/significant other Available Help at Discharge: Family Type of Home: House Home Access: Level entry Home Layout: One level Home Equipment: None Prior Function Level of Independence: Independent Communication Communication: No difficulties    Cognition  Cognition Arousal/Alertness: Awake/alert Behavior During Therapy: WFL for tasks assessed/performed Overall Cognitive Status: Within Functional Limits for tasks assessed    Extremity/Trunk Assessment Lower Extremity Assessment Lower Extremity Assessment: Overall WFL for tasks assessed Cervical / Trunk Assessment Cervical / Trunk Assessment: Other exceptions Cervical / Trunk Exceptions: abdominal OR   Balance Balance Balance Assessed: Yes Static Sitting Balance Static Sitting - Balance Support: Bilateral upper extremity supported Static Sitting - Level of Assistance: 5: Stand by assistance Static Standing Balance Static Standing - Balance Support: Bilateral upper extremity supported Static Standing - Level of Assistance: 5: Stand by assistance Static Standing - Comment/# of Minutes: pt stood for 2 minutes  End of Session PT - End of Session Activity Tolerance: Patient limited by pain;Other (comment) ("wooziness") Patient left: in chair;with family/visitor present Nurse Communication: Mobility status  GP    Bayard Hugger. [place], [place] [phone] 09/16/2012, 1:44 PM

## 2012-09-16 NOTE — Progress Notes (Signed)
1 Day Post-Op Subjective: Patient reports incisional pain and pain control adequate. His main complaint other than his incisional pain is that of his arterial line causing irritation and alarming. His pressures have been stable and actually elevated slightly. He otherwise seems to be doing quite well but is getting some mild nausea when he stands but otherwise is not having any nausea or vomiting.  Objective: Vital signs in last 24 hours: Temp:  [97.6 F (36.4 C)-98.9 F (37.2 C)] 98.6 F (37 C) (07/19 0400) Pulse Rate:  [81-95] 81 (07/19 0000) Resp:  [9-22] 14 (07/19 0400) BP: (126-174)/(51-78) 126/51 mmHg (07/19 0600) SpO2:  [10 %-100 %] 97 % (07/19 0400) Arterial Line BP: (104-166)/(44-123) 115/44 mmHg (07/19 0000)  Intake/Output from previous day: 07/18 0701 - 07/19 0700 In: 8437.5 [I.V.:8150; IV Piggyback:287.5] Out: 2820 [Urine:2030; Drains:340; Blood:450] Intake/Output this shift: Total I/O In: 1650 [I.V.:1375; IV Piggyback:275] Out: 1535 [Urine:1305; Drains:230]  Physical Exam:  General:alert, cooperative and no distress GI: His abdomen is soft and nondistended. His ostomy appears pink and viable with stents present and slightly blood-tinged urine but good output. His incision has a dry, intact dressing. His drain is functioning and has had an expected amount of output. He has no penile edema or genital ecchymosis   Lab Results:  Recent Labs  09/14/12 1400 09/15/12 1107 09/15/12 1611  HGB 14.1 12.2* 11.3*  HCT 41.5 36.0* 32.9*   BMET  Recent Labs  09/14/12 1400 09/15/12 1107 09/15/12 1611  NA 138 135 134*  K 3.9 4.2 3.6  CL 100  --  100  CO2 28  --  21  GLUCOSE 128*  --  243*  BUN 9  --  10  CREATININE 0.78  --  0.88  CALCIUM 9.6  --  8.1*   No results found for this basename: LABPT, INR,  in the last 72 hours No results found for this basename: LABURIN,  in the last 72 hours Results for orders placed during the hospital encounter of 09/14/12   SURGICAL PCR SCREEN     Status: None   Collection Time    09/14/12  7:03 PM      Result Value Range Status   MRSA, PCR NEGATIVE  NEGATIVE Final   Staphylococcus aureus NEGATIVE  NEGATIVE Final   Comment:            The Xpert SA Assay (FDA     approved for NASAL specimens     in patients over 67 years of age),     is one component of     a comprehensive surveillance     program.  Test performance has     been validated by [employer] for patients greater     than or equal to 58 year old.     It is not intended     to diagnose infection nor to     guide or monitor treatment.    Studies/Results: No results found.  Assessment/Plan: His hemoglobin had drifted slightly but he is maintaining an excellent blood pressure. His current CBC remains pending at this time. He is only nauseated when he stands. He otherwise seems to be doing quite well and with stable vital signs I'm going to go ahead and remove his art line and one of his IVs. His abdomen is obviously quiet but not distended. He will be maintained n.p.o. His drain is not putting out a significant amount and his urine output remains quite good.  Check CBC and BMP.  Discontinue arterial line and O2.  Maintain n.p.o.  Continue pulmonary toilet and out of bed.  Anticipate he will be able to go to the floor tomorrow morning.   LOS: 2 days   Reeya Bound C 09/16/2012, 6:55 AM

## 2012-09-17 LAB — CBC
Hemoglobin: 10.2 g/dL — ABNORMAL LOW (ref 13.0–17.0)
Platelets: 202 10*3/uL (ref 150–400)
RBC: 3.27 MIL/uL — ABNORMAL LOW (ref 4.22–5.81)
WBC: 12.9 10*3/uL — ABNORMAL HIGH (ref 4.0–10.5)

## 2012-09-17 LAB — BASIC METABOLIC PANEL
CO2: 28 mEq/L (ref 19–32)
Chloride: 100 mEq/L (ref 96–112)
Glucose, Bld: 119 mg/dL — ABNORMAL HIGH (ref 70–99)
Sodium: 134 mEq/L — ABNORMAL LOW (ref 135–145)

## 2012-09-17 MED ORDER — HYDROMORPHONE HCL 2 MG PO TABS
4.0000 mg | ORAL_TABLET | ORAL | Status: DC | PRN
  Administered 2012-09-17 (×2): 2 mg via ORAL
  Administered 2012-09-17: 4 mg via ORAL
  Administered 2012-09-18 – 2012-09-19 (×11): 2 mg via ORAL
  Filled 2012-09-17 (×7): qty 1
  Filled 2012-09-17: qty 2
  Filled 2012-09-17 (×6): qty 1

## 2012-09-17 NOTE — Progress Notes (Signed)
Pt C/O not being able to rest when he tries to sleep and the O2 sat monitor starts to alarm because of low sats or resp. Pt and wife ask to speak with MD about same. MD made aware. New orders given. Pt started on PO pain med; pca d/c'd. Pt voiced relief from noise. Pt and family are happy. Vwilliams,rn.

## 2012-09-17 NOTE — Progress Notes (Signed)
2 Days Post-Op Subjective: Patient reports his incisional pain has diminished and he has been up and walking more over the past 24 hours. He is not having any nausea or vomiting. He is hungry. He is not having any flatus yet.  Objective: Vital signs in last 24 hours: Temp:  [97.9 F (36.6 C)-99.3 F (37.4 C)] 98.5 F (36.9 C) (07/20 0400) Pulse Rate:  [73-87] 73 (07/20 0020) Resp:  [9-20] 12 (07/20 0406) BP: (126-170)/(51-77) 135/71 mmHg (07/20 0020) SpO2:  [91 %-98 %] 92 % (07/20 0406)  Intake/Output from previous day: 07/19 0701 - 07/20 0700 In: 2837.5 [I.V.:2625; IV Piggyback:212.5] Out: 2630 [Urine:2370; Drains:260] Intake/Output this shift: Total I/O In: 1162.5 [I.V.:1125; IV Piggyback:37.5] Out: 875 [Urine:745; Drains:130]  Physical Exam:  General:alert, cooperative and no distress GI: His abdomen is flat and soft. His stoma is pink and viable with 2 stents present and draining slightly pink urine. His incisions are healing well with no drainage at this time. There is no erythema or sign of infection. Genitalia are normal without significant edema or ecchymoses.  Lab Results:  Recent Labs  09/15/12 1611 09/16/12 0335 09/17/12 0339  HGB 11.3* 11.1* 10.2*  HCT 32.9* 32.4* 30.9*   BMET  Recent Labs  09/16/12 0335 09/17/12 0339  NA 133* 134*  K 4.5 4.0  CL 98 100  CO2 22 28  GLUCOSE 203* 119*  BUN 11 12  CREATININE 0.89 1.01  CALCIUM 8.4 8.4   No results found for this basename: LABPT, INR,  in the last 72 hours No results found for this basename: LABURIN,  in the last 72 hours Results for orders placed during the hospital encounter of 09/14/12  SURGICAL PCR SCREEN     Status: None   Collection Time    09/14/12  7:03 PM      Result Value Range Status   MRSA, PCR NEGATIVE  NEGATIVE Final   Staphylococcus aureus NEGATIVE  NEGATIVE Final   Comment:            The Xpert SA Assay (FDA     approved for NASAL specimens     in patients over 21 years of  age),     is one component of     a comprehensive surveillance     program.  Test performance has     been validated by [employer] for patients greater     than or equal to 62 year old.     It is not intended     to diagnose infection nor to     guide or monitor treatment.    Studies/Results: No results found.  Assessment/Plan: He is progressing well. No flatus yet so he will remain n.p.o. His hemoglobin dropped by 1 g over yesterday although there is no sign of active bleeding at this time. I do not feel that this is clinically significant and likely is to some degree from dilution. Continued monitoring of his H&H will be performed. I am going to move him to the floor.  Transfer to floor.  His dressing will be changed today.  Continue n.p.o.  Follow hemoglobin.   LOS: 3 days   Maxamillian Tienda C 09/17/2012, 5:36 AM

## 2012-09-18 ENCOUNTER — Encounter (HOSPITAL_COMMUNITY): Payer: Self-pay | Admitting: Urology

## 2012-09-18 LAB — BASIC METABOLIC PANEL
Chloride: 100 mEq/L (ref 96–112)
GFR calc Af Amer: >90 mL/min (ref >90)
GFR calc non Af Amer: 87 mL/min — ABNORMAL LOW (ref >90)
Glucose, Bld: 96 mg/dL (ref 70–99)
Potassium: 3.5 mEq/L (ref 3.5–5.1)
Sodium: 135 mEq/L (ref 135–145)

## 2012-09-18 LAB — CBC
Hemoglobin: 10.2 g/dL — ABNORMAL LOW (ref 13.0–17.0)
MCHC: 33.8 g/dL (ref 30.0–36.0)
RDW: 12.9 % (ref 11.5–15.5)
WBC: 9.9 10*3/uL (ref 4.0–10.5)

## 2012-09-18 LAB — CREATININE, FLUID (PLEURAL, PERITONEAL, JP DRAINAGE)

## 2012-09-18 MED ORDER — POTASSIUM CHLORIDE IN NACL 20-0.9 MEQ/L-% IV SOLN
INTRAVENOUS | Status: DC
  Administered 2012-09-18: 1000 mL via INTRAVENOUS
  Filled 2012-09-18 (×3): qty 1000

## 2012-09-18 NOTE — Progress Notes (Signed)
PT Cancellation Note  ___Treatment cancelled today due to medical issues with patient which prohibited therapy  ___ Treatment cancelled today due to patient receiving procedure or test   ___ Treatment cancelled today due to patient's refusal to participate   _X_ Pt amb with spouse several times a day.   Felecia Shelling  PTA [facility]  Acute  Rehab Pager      [phone]

## 2012-09-18 NOTE — Progress Notes (Addendum)
Patient IVF was regular NS but order stated NS with KCL. Made Pharmacy aware of needed IVF. Pharm recommend calling MD. Woodfin Ganja MD- DR Renin aware of error. No additional order received at that time. Correct fluids are hanging now.  Last documented K level was 3.5 (09/18/12)

## 2012-09-18 NOTE — Consult Note (Signed)
WOC ostomy consult  Stoma type/location: RLQ, ileal conduit Stomal assessment/size: slightly larger than 1 3/4" round, flush with the skin.  Stents in place Peristomal assessment: intact, but he did have two areas at the outer aspects of the tape border on the pouch placed in the OR that have caused some skin irritation.  Treatment options for stomal/peristomal skin: I have covered the two areas of irritation (blistered but ruptured) with hydrocolloid to protect.  Will monitor the skin under the tape border for sensitivity.  Output urine, clear a little blood tinged Ostomy pouching: 1pc. Convex used today, due to the stoma being flush with the skin . Pt's wife seems to think the 1pc flat was better due to lower profile, however I have tried to explain the rationale for convexity.  We will try the convex 1pc for now and if the patient reports it is not comfortable or it does not seem to work for him we have 1pc with barrier ring.  Education provided: Demonstrated pouch change with pt and his wife today.  We have reviewed the connection to BSD and she did connect the new pouch, however she did not open the pouch. So I have reviewed the "gold drop" and that this will indicate when the pouch is open. We discussed using a wick, but I will demonstrate with next pouch change.  Peristomal skin care reviewed.  Pouching options discussed.  They seem very anxious to learn the care, but may need support of HHRN. Pt seems more open to [facility] more than wife.    WOC will support ostomy care and teaching this week.  I have set up time on Wednesday with patient and his wife at 10 am to have her change the pouch Armen Pickup RN,CWOCN [phone]

## 2012-09-18 NOTE — Progress Notes (Signed)
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 Had preadmission for bowel prep and stomal marking 7/17. Fascial pain pump DC'd 7/20 and transferred to floor. Resumed bowel function 7/21. Path pending.   2 - Disposition - PT eval pending.   Today Benjamin Yang is feeling stronger. He is ambulatory and had two large BM's this AM. No nausea / emesis. No wound problems.   Objective: Vital signs in last 24 hours: Temp:  [97.9 F (36.6 C)-98.7 F (37.1 C)] 97.9 F (36.6 C) (07/21 0525) Pulse Rate:  [78-88] 80 (07/21 0525) Resp:  [14-18] 18 (07/21 0525) BP: (129-158)/(69-79) 158/77 mmHg (07/21 0525) SpO2:  [92 %-99 %] 92 % (07/21 0525) Last BM Date: 09/18/12  Intake/Output from previous day: 07/20 0701 - 07/21 0700 In: 2119 [I.V.:2119] Out: 3530 [Urine:3100; Drains:430] Intake/Output this shift: Total I/O In: 360 [P.O.:360] Out: 35 [Drains:35]  General appearance: alert, cooperative, appears stated age and wife at bedside Head: Normocephalic, without obvious abnormality, atraumatic Eyes: conjunctivae/corneas clear. PERRL, EOM's intact. Fundi benign. Ears: normal TM's and external ear canals both ears Nose: Nares normal. Septum midline. Mucosa normal. No drainage or sinus tenderness. Throat: lips, mucosa, and tongue normal; teeth and gums normal Neck: no adenopathy, no carotid bruit, no JVD, supple, symmetrical, trachea midline and thyroid not enlarged, symmetric, no tenderness/mass/nodules Back: symmetric, no curvature. ROM normal. No CVA tenderness. Resp: clear to auscultation bilaterally Chest wall: no tenderness Cardio: regular rate and rhythm, S1, S2 normal, no murmur, click, rub or gallop GI: soft, non-tender; bowel sounds normal;  no masses,  no organomegaly Male genitalia: normal Extremities: extremities normal, atraumatic, no cyanosis or edema Pulses: 2+ and symmetric Skin: Skin color, texture, turgor normal. No rashes or lesions Lymph nodes: Cervical, supraclavicular, and axillary nodes normal. Neurologic: Grossly normal Incision/Wound: RLQ Urostomy pink / patent with bilateral bander stens in place, copious light pink urine. Midline incisiion sited c/d/i with staples, no sig erythema. JP with serosanguinous fluid.   Lab Results:   Recent Labs  09/17/12 0339 09/18/12 0430  WBC 12.9* 9.9  HGB 10.2* 10.2*  HCT 30.9* 30.2*  PLT 202 176   BMET  Recent Labs  09/17/12 0339 09/18/12 0430  NA 134* 135  K 4.0 3.5  CL 100 100  CO2 28 28  GLUCOSE 119* 96  BUN 12 8  CREATININE 1.01 0.77  CALCIUM 8.4 8.3*   PT/INR No results found for this basename: LABPROT, INR,  in the last 72 hours ABG  Recent Labs  09/15/12 1107  PHART 7.364  HCO3 23.5    Studies/Results: No results found.  Anti-infectives: Anti-infectives   Start     Dose/Rate Route Frequency Ordered Stop   09/15/12 1830  piperacillin-tazobactam (ZOSYN) IVPB 3.375 g     3.375 g 12.5 mL/hr over 240 Minutes Intravenous Every 8 hours 09/15/12 1726 09/17/12 1434   09/15/12 1400  piperacillin-tazobactam (ZOSYN) 3.375 g in dextrose 5 % 50 mL IVPB  Status:  Discontinued     3.375 g 100 mL/hr over 30 Minutes Intravenous 3 times per day 09/15/12 1334 09/15/12 1745   09/14/12 1130  piperacillin-tazobactam (ZOSYN) IVPB 3.375 g     3.375 g 100 mL/hr over 30 Minutes Intravenous  Once 09/14/12 1032 09/15/12 1352      Assessment/Plan:  1 - Muscle Invasive High-Grade Bladder Cancer - Doing very well POD 3. Adv diet as tollerated to regular, decrease IVF to 1/2 maintneance, check JP Cr, continue ambulation.   2 - Disposition - Likely home with HH later this week, appreciate PT, Case management, wound ostomy help.   LOS: 4 days    [facility],  Benjamin Yang 09/18/2012

## 2012-09-19 LAB — BASIC METABOLIC PANEL
Chloride: 101 mEq/L (ref 96–112)
GFR calc Af Amer: >90 mL/min (ref >90)
Potassium: 3.4 mEq/L — ABNORMAL LOW (ref 3.5–5.1)

## 2012-09-19 LAB — CBC
Platelets: 200 10*3/uL (ref 150–400)
RBC: 3.12 MIL/uL — ABNORMAL LOW (ref 4.22–5.81)
RDW: 12.8 % (ref 11.5–15.5)
WBC: 9.9 10*3/uL (ref 4.0–10.5)

## 2012-09-19 MED ORDER — HYDROMORPHONE HCL 2 MG PO TABS
2.0000 mg | ORAL_TABLET | ORAL | Status: DC | PRN
  Administered 2012-09-19 – 2012-09-20 (×8): 2 mg via ORAL
  Filled 2012-09-19 (×8): qty 1

## 2012-09-19 NOTE — Progress Notes (Signed)
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 Had preadmission for bowel prep and stomal marking 7/17. Fascial pain pump DC'd 7/20 and transferred to floor. Resumed bowel function 7/21. JP removed 7/22. Path pending.   2 - Disposition - Plan for home with [facility] for new ostomy teaching / supplies.   Today Benjamin Yang continues to make progress. Ambulatory in hall multiple times daily, pain controlled, tollerating regular diet.  Objective: Vital signs in last 24 hours: Temp:  [98.4 F (36.9 C)-100.2 F (37.9 C)] 98.9 F (37.2 C) (07/22 0522) Pulse Rate:  [73-81] 73 (07/22 0522) Resp:  [20] 20 (07/22 0522) BP: (134-145)/(59-66) 134/63 mmHg (07/22 0522) SpO2:  [97 %-98 %] 97 % (07/22 0522) Last BM Date: 09/18/12  Intake/Output from previous day: 07/21 0701 - 07/22 0700 In: 1792.5 [P.O.:720; I.V.:1072.5] Out: 3447 [Urine:3175; Drains:270; Stool:2] Intake/Output this shift:    General appearance: alert, cooperative, appears stated age and wife at bedside Head: Normocephalic, without obvious abnormality, atraumatic Eyes: conjunctivae/corneas clear. PERRL, EOM's intact. Fundi benign. Ears: normal TM's and external ear canals both ears Nose: Nares normal. Septum midline. Mucosa normal. No drainage or sinus tenderness. Throat: lips, mucosa, and tongue normal; teeth and gums normal Neck: no adenopathy, no carotid bruit, no JVD, supple, symmetrical, trachea midline and thyroid not enlarged, symmetric, no tenderness/mass/nodules Back: symmetric, no curvature. ROM normal. No CVA tenderness. Resp: clear to auscultation bilaterally Chest wall: no tenderness Cardio: regular rate and rhythm, S1, S2 normal, no murmur, click, rub or gallop GI: soft,  non-tender; bowel sounds normal; no masses,  no organomegaly Male genitalia: normal Extremities: extremities normal, atraumatic, no cyanosis or edema Pulses: 2+ and symmetric Skin: Skin color, texture, turgor normal. No rashes or lesions Lymph nodes: Cervical, supraclavicular, and axillary nodes normal. Neurologic: Grossly normal Incision/Wound: RLQ Urostomy pink and patent with bilat bander stents in place. Incision sites c/d/i with staples. JP with serous fluid, removed and dry dressing applied.   Lab Results:   Recent Labs  09/18/12 0430 09/19/12 0514  WBC 9.9 9.9  HGB 10.2* 9.9*  HCT 30.2* 29.0*  PLT 176 200   BMET  Recent Labs  09/18/12 0430 09/19/12 0514  NA 135 136  K 3.5 3.4*  CL 100 101  CO2 28 27  GLUCOSE 96 141*  BUN 8 8  CREATININE 0.77 0.83  CALCIUM 8.3* 8.5   PT/INR No results found for this basename: LABPROT, INR,  in the last 72 hours ABG No results found for this basename: PHART, PCO2, PO2, HCO3,  in the last 72 hours  Studies/Results: No results found.  Anti-infectives: Anti-infectives   Start     Dose/Rate Route Frequency Ordered Stop   09/15/12 1830  piperacillin-tazobactam (ZOSYN) IVPB 3.375 g     3.375 g 12.5 mL/hr over 240 Minutes Intravenous Every 8 hours 09/15/12 1726 09/17/12 1434   09/15/12 1400  piperacillin-tazobactam (ZOSYN) 3.375 g in dextrose 5 % 50 mL IVPB  Status:  Discontinued     3.375 g 100 mL/hr over 30 Minutes Intravenous 3 times per day 09/15/12 1334 09/15/12 1745   09/14/12 1130  piperacillin-tazobactam (ZOSYN) IVPB 3.375 g     3.375 g 100 mL/hr over 30 Minutes Intravenous  Once 09/14/12 1032 09/15/12 1352      Assessment/Plan:  1 - Muscle Invasive High-Grade Bladder Cancer - Doing very well POD 4. DC JP. Saline lock IV. Plan for DC tomorrow as long as has good day today. Path pending.  2 - Disposition - Likely home with [facility] tomorrow, appreciate PT, Case management, wound ostomy help.  [facility],  Benjamin Yang 09/19/2012

## 2012-09-19 NOTE — Consult Note (Signed)
WOC ostomy follow up Stoma type/location: RLQ, ileal conduit  Output: Stents in place, draining clear yellow urine, less bloody today Ostomy pouching: 1pc convex pouch in place, intact doing well. Used since the stoma is flush with the skin, however pt. Reports he and his wife would like to go back to flat 1pc with barrier ring at pouch change tom. They feel that this will be lower profile against his abdomen.  Supplies ordered in preparation for pouch change tom and possible discharge tom.  I will enroll in [facility] Secure Start dc program as well today for samples to be sent to the home.  Plans for [facility] to continue to support home ostomy education with patient and wife.   WOC team will follow along with you Armen Pickup RN,CWOCN [phone]

## 2012-09-20 MED ORDER — SENNOSIDES-DOCUSATE SODIUM 8.6-50 MG PO TABS: 1.0000 | ORAL_TABLET | Freq: Two times a day (BID) | ORAL | Status: DC

## 2012-09-20 MED ORDER — SULFAMETHOXAZOLE-TMP DS 800-160 MG PO TABS: 1.0000 | ORAL_TABLET | Freq: Every day | ORAL | Status: DC

## 2012-09-20 MED ORDER — HYDROMORPHONE HCL 2 MG PO TABS: 2.0000 mg | ORAL_TABLET | ORAL | Status: DC | PRN

## 2012-09-20 NOTE — Consult Note (Signed)
WOC ostomy follow up Stoma type/location: RLQ, ileal conduit Stomal assessment/size: 1 1/4" round, flush with the skin Peristomal assessment: some denudation noted from 3-9 o'clock today Pt and wife wanted to switch to 1pc flat pouch today, so we added 2" barrier ring around the stoma for skin protection and for gentle convexity to manage flush stoma Treatment options for stomal/peristomal skin: pt has two areas of skin damage from the tape border, we are covering these two areas with hydrocolloid to protect and encourage reepithelialization  Output clear, yellow urine Ostomy pouching: 1pc. Flat with 2" barrier ring  Education provided: Wife completed pouch change with minimal assistance.  Stents are still in place.  She is able to disconnect from BSD. Cut new wafer, applied barrier ring.  Cleansed skin around the stoma.  I will provide [employer] catalog today for supplies needed after HHRN dc.   WOC team will continue to follow along with you Kortni Hasten [facility] RN,CWOCN [phone]

## 2012-09-20 NOTE — Discharge Summary (Signed)
Physician Discharge Summary  Patient ID: Benjamin Yang MRN: [id number] DOB/AGE: 1935-12-07 77 y.o.  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 Had preadmission for bowel prep and stomal marking 7/17. Fascial pain pump DC'd 7/20 and transferred to floor. Resumed bowel function 7/21. JP removed 7/22. By POD 5, 7/23, day of discharge pt ambulatory, tolerate regular diet, pain controlled with PO meds and felt to be adequate for discharge.  Final path pending at discharge.  2 - Disposition - Plan for home with [facility] for new ostomy teaching / supplies.     Consults: wound ostomy  Significant Diagnostic Studies: labs: Hgb >9, Cr <1.5, Path pending  Treatments: surgery: cysto with tumor dye injection + robotic cystoprostatectomy with node dissection + ileal conduit urinary diversion + fascial pain pump inmplanation 09/15/2012   Discharge Exam: Blood pressure 131/71, pulse 75, temperature 98.1 F (36.7 C), temperature source Oral, resp. rate 12, height 6\' 2"  (1.88 m), weight 91.4 kg (201 lb 8 oz), SpO2 98.00%. General appearance: alert, cooperative and appears stated age Head: Normocephalic, without obvious abnormality, atraumatic Eyes: conjunctivae/corneas clear. PERRL, EOM's intact. Fundi benign. Ears: normal TM's and external ear canals both ears Nose: Nares normal. Septum midline. Mucosa normal. No drainage or sinus tenderness. Throat: lips, mucosa, and tongue normal; teeth and gums normal Neck: no adenopathy, no carotid bruit, no JVD,  supple, symmetrical, trachea midline and thyroid not enlarged, symmetric, no tenderness/mass/nodules Back: symmetric, no curvature. ROM normal. No CVA tenderness. Resp: clear to auscultation bilaterally Chest wall: no tenderness Cardio: regular rate and rhythm, S1, S2 normal, no murmur, click, rub or gallop GI: soft, non-tender; bowel sounds normal; no masses,  no organomegaly Male genitalia: normal Extremities: extremities normal, atraumatic, no cyanosis or edema Pulses: 2+ and symmetric Skin: Skin color, texture, turgor normal. No rashes or lesions Lymph nodes: Cervical, supraclavicular, and axillary nodes normal. Neurologic: Grossly normal Incision/Wound: RLQ Urostomy pink / patent with bander stents. Midline insicion c/d/i with staples.   Disposition: 01-Home or Self Care     Medication List    STOP taking these medications       ibuprofen 200 MG tablet  Commonly known as:  ADVIL,MOTRIN      TAKE these medications       amLODipine 10 MG tablet  Commonly known as:  NORVASC  Take 10 mg by mouth daily.     HYDROmorphone 2 MG tablet  Commonly known as:  DILAUDID  Take 1 tablet (2 mg total) by mouth every 2 (two) hours as needed.     LORazepam 0.5 MG tablet  Commonly known as:  ATIVAN  Take 0.5 mg by mouth every 8 (eight) hours.     senna-docusate 8.6-50 MG per tablet  Commonly known as:  Senokot-S  Take 1 tablet by mouth 2 (two) times daily. While taking pain meds to prevent constipation,     sulfamethoxazole-trimethoprim 800-160 MG per tablet  Commonly known as:  BACTRIM DS  Take 1 tablet by mouth daily. X 3 days starting day before Urology stent removal.     telmisartan 80 MG tablet  Commonly known as:  MICARDIS  Take 80 mg by mouth at bedtime.     vitamin C 500 MG tablet  Commonly known as:  ASCORBIC ACID  Take 500 mg by mouth daily.           Follow-up Information   Follow up with Sebastian Ache, MD. (call office for follow up appt information )     Contact information:   [address] [place] [address] [phone]       Signed: Sebastian Ache 09/20/2012, 11:04 AM

## 2012-09-21 DIAGNOSIS — Z436 Encounter for attention to other artificial openings of urinary tract: Secondary | ICD-10-CM | POA: Diagnosis not present

## 2012-09-21 DIAGNOSIS — I10 Essential (primary) hypertension: Secondary | ICD-10-CM | POA: Diagnosis not present

## 2012-09-24 DIAGNOSIS — I10 Essential (primary) hypertension: Secondary | ICD-10-CM | POA: Diagnosis not present

## 2012-09-24 DIAGNOSIS — Z436 Encounter for attention to other artificial openings of urinary tract: Secondary | ICD-10-CM | POA: Diagnosis not present

## 2012-09-26 DIAGNOSIS — Z436 Encounter for attention to other artificial openings of urinary tract: Secondary | ICD-10-CM | POA: Diagnosis not present

## 2012-09-26 DIAGNOSIS — I10 Essential (primary) hypertension: Secondary | ICD-10-CM | POA: Diagnosis not present

## 2012-09-28 DIAGNOSIS — Z436 Encounter for attention to other artificial openings of urinary tract: Secondary | ICD-10-CM | POA: Diagnosis not present

## 2012-09-28 DIAGNOSIS — I10 Essential (primary) hypertension: Secondary | ICD-10-CM | POA: Diagnosis not present

## 2012-10-02 DIAGNOSIS — Z436 Encounter for attention to other artificial openings of urinary tract: Secondary | ICD-10-CM | POA: Diagnosis not present

## 2012-10-02 DIAGNOSIS — I10 Essential (primary) hypertension: Secondary | ICD-10-CM | POA: Diagnosis not present

## 2012-10-12 DIAGNOSIS — I10 Essential (primary) hypertension: Secondary | ICD-10-CM | POA: Diagnosis not present

## 2012-10-12 DIAGNOSIS — Z436 Encounter for attention to other artificial openings of urinary tract: Secondary | ICD-10-CM | POA: Diagnosis not present

## 2012-10-13 DIAGNOSIS — I10 Essential (primary) hypertension: Secondary | ICD-10-CM | POA: Diagnosis not present

## 2012-10-13 DIAGNOSIS — N390 Urinary tract infection, site not specified: Secondary | ICD-10-CM | POA: Diagnosis not present

## 2012-10-13 DIAGNOSIS — Z436 Encounter for attention to other artificial openings of urinary tract: Secondary | ICD-10-CM | POA: Diagnosis not present

## 2012-10-19 DIAGNOSIS — C679 Malignant neoplasm of bladder, unspecified: Secondary | ICD-10-CM | POA: Diagnosis not present

## 2012-10-20 DIAGNOSIS — Z436 Encounter for attention to other artificial openings of urinary tract: Secondary | ICD-10-CM | POA: Diagnosis not present

## 2012-10-20 DIAGNOSIS — I10 Essential (primary) hypertension: Secondary | ICD-10-CM | POA: Diagnosis not present

## 2012-10-25 DIAGNOSIS — I10 Essential (primary) hypertension: Secondary | ICD-10-CM | POA: Diagnosis not present

## 2012-10-25 DIAGNOSIS — N390 Urinary tract infection, site not specified: Secondary | ICD-10-CM | POA: Diagnosis not present

## 2012-10-25 DIAGNOSIS — Z436 Encounter for attention to other artificial openings of urinary tract: Secondary | ICD-10-CM | POA: Diagnosis not present

## 2012-10-31 DIAGNOSIS — I10 Essential (primary) hypertension: Secondary | ICD-10-CM | POA: Diagnosis not present

## 2012-10-31 DIAGNOSIS — Z436 Encounter for attention to other artificial openings of urinary tract: Secondary | ICD-10-CM | POA: Diagnosis not present

## 2012-11-08 DIAGNOSIS — C679 Malignant neoplasm of bladder, unspecified: Secondary | ICD-10-CM | POA: Diagnosis not present

## 2012-11-08 DIAGNOSIS — F411 Generalized anxiety disorder: Secondary | ICD-10-CM | POA: Diagnosis not present

## 2012-11-08 DIAGNOSIS — N390 Urinary tract infection, site not specified: Secondary | ICD-10-CM | POA: Diagnosis not present

## 2012-11-08 DIAGNOSIS — I10 Essential (primary) hypertension: Secondary | ICD-10-CM | POA: Diagnosis not present

## 2012-11-15 DIAGNOSIS — Z436 Encounter for attention to other artificial openings of urinary tract: Secondary | ICD-10-CM | POA: Diagnosis not present

## 2012-11-15 DIAGNOSIS — I10 Essential (primary) hypertension: Secondary | ICD-10-CM | POA: Diagnosis not present

## 2012-11-30 ENCOUNTER — Telehealth: Payer: Self-pay | Admitting: Oncology

## 2012-11-30 DIAGNOSIS — R82998 Other abnormal findings in urine: Secondary | ICD-10-CM | POA: Diagnosis not present

## 2012-11-30 DIAGNOSIS — C679 Malignant neoplasm of bladder, unspecified: Secondary | ICD-10-CM | POA: Diagnosis not present

## 2012-11-30 NOTE — Telephone Encounter (Signed)
LVOM FOR PT TO RETURN CALL IN RE TO REFERRAL.  °

## 2012-12-04 ENCOUNTER — Telehealth: Payer: Self-pay | Admitting: Oncology

## 2012-12-04 NOTE — Telephone Encounter (Signed)
2ND LVOM FOR PT TO RETURN CALL IN RE TO REFERRA;.

## 2012-12-12 ENCOUNTER — Telehealth: Payer: Self-pay | Admitting: Oncology

## 2012-12-12 NOTE — Telephone Encounter (Signed)
Pt's wife called in ref to np appt. Pt is not ready to come here.   Pt will call to make appt when he is ready.

## 2013-01-04 ENCOUNTER — Other Ambulatory Visit: Payer: Self-pay

## 2013-01-10 DIAGNOSIS — C679 Malignant neoplasm of bladder, unspecified: Secondary | ICD-10-CM | POA: Diagnosis not present

## 2013-01-10 DIAGNOSIS — F411 Generalized anxiety disorder: Secondary | ICD-10-CM | POA: Diagnosis not present

## 2013-01-10 DIAGNOSIS — I10 Essential (primary) hypertension: Secondary | ICD-10-CM | POA: Diagnosis not present

## 2013-03-22 DIAGNOSIS — I69998 Other sequelae following unspecified cerebrovascular disease: Secondary | ICD-10-CM | POA: Diagnosis not present

## 2013-03-22 DIAGNOSIS — I10 Essential (primary) hypertension: Secondary | ICD-10-CM | POA: Diagnosis not present

## 2013-06-05 ENCOUNTER — Ambulatory Visit (HOSPITAL_COMMUNITY)
Admission: RE | Admit: 2013-06-05 | Discharge: 2013-06-05 | Disposition: A | Discharge status: Home / Self Care | Payer: Medicare Other | Source: Ambulatory Visit | Attending: Urology | Admitting: Urology

## 2013-06-05 ENCOUNTER — Other Ambulatory Visit (HOSPITAL_COMMUNITY): Payer: Self-pay | Admitting: Urology

## 2013-06-05 DIAGNOSIS — C679 Malignant neoplasm of bladder, unspecified: Secondary | ICD-10-CM | POA: Diagnosis not present

## 2013-06-05 DIAGNOSIS — I10 Essential (primary) hypertension: Secondary | ICD-10-CM | POA: Diagnosis not present

## 2013-06-05 DIAGNOSIS — Z8551 Personal history of malignant neoplasm of bladder: Secondary | ICD-10-CM | POA: Diagnosis not present

## 2013-06-05 DIAGNOSIS — J984 Other disorders of lung: Secondary | ICD-10-CM | POA: Diagnosis not present

## 2013-06-07 DIAGNOSIS — N139 Obstructive and reflux uropathy, unspecified: Secondary | ICD-10-CM | POA: Diagnosis not present

## 2013-06-07 DIAGNOSIS — N401 Enlarged prostate with lower urinary tract symptoms: Secondary | ICD-10-CM | POA: Diagnosis not present

## 2013-06-07 DIAGNOSIS — N138 Other obstructive and reflux uropathy: Secondary | ICD-10-CM | POA: Diagnosis not present

## 2013-06-07 DIAGNOSIS — C801 Malignant (primary) neoplasm, unspecified: Secondary | ICD-10-CM | POA: Diagnosis not present

## 2013-06-07 DIAGNOSIS — C679 Malignant neoplasm of bladder, unspecified: Secondary | ICD-10-CM | POA: Diagnosis not present

## 2013-09-23 IMAGING — CR DG CHEST 1V
1 series · 1 of 1 positions shown · non-contrast
Comparison: None.

CLINICAL DATA: Altered mental status.  Hypertension.  Cigarette
smoker.

CHEST - 1 VIEW

[view not recorded]
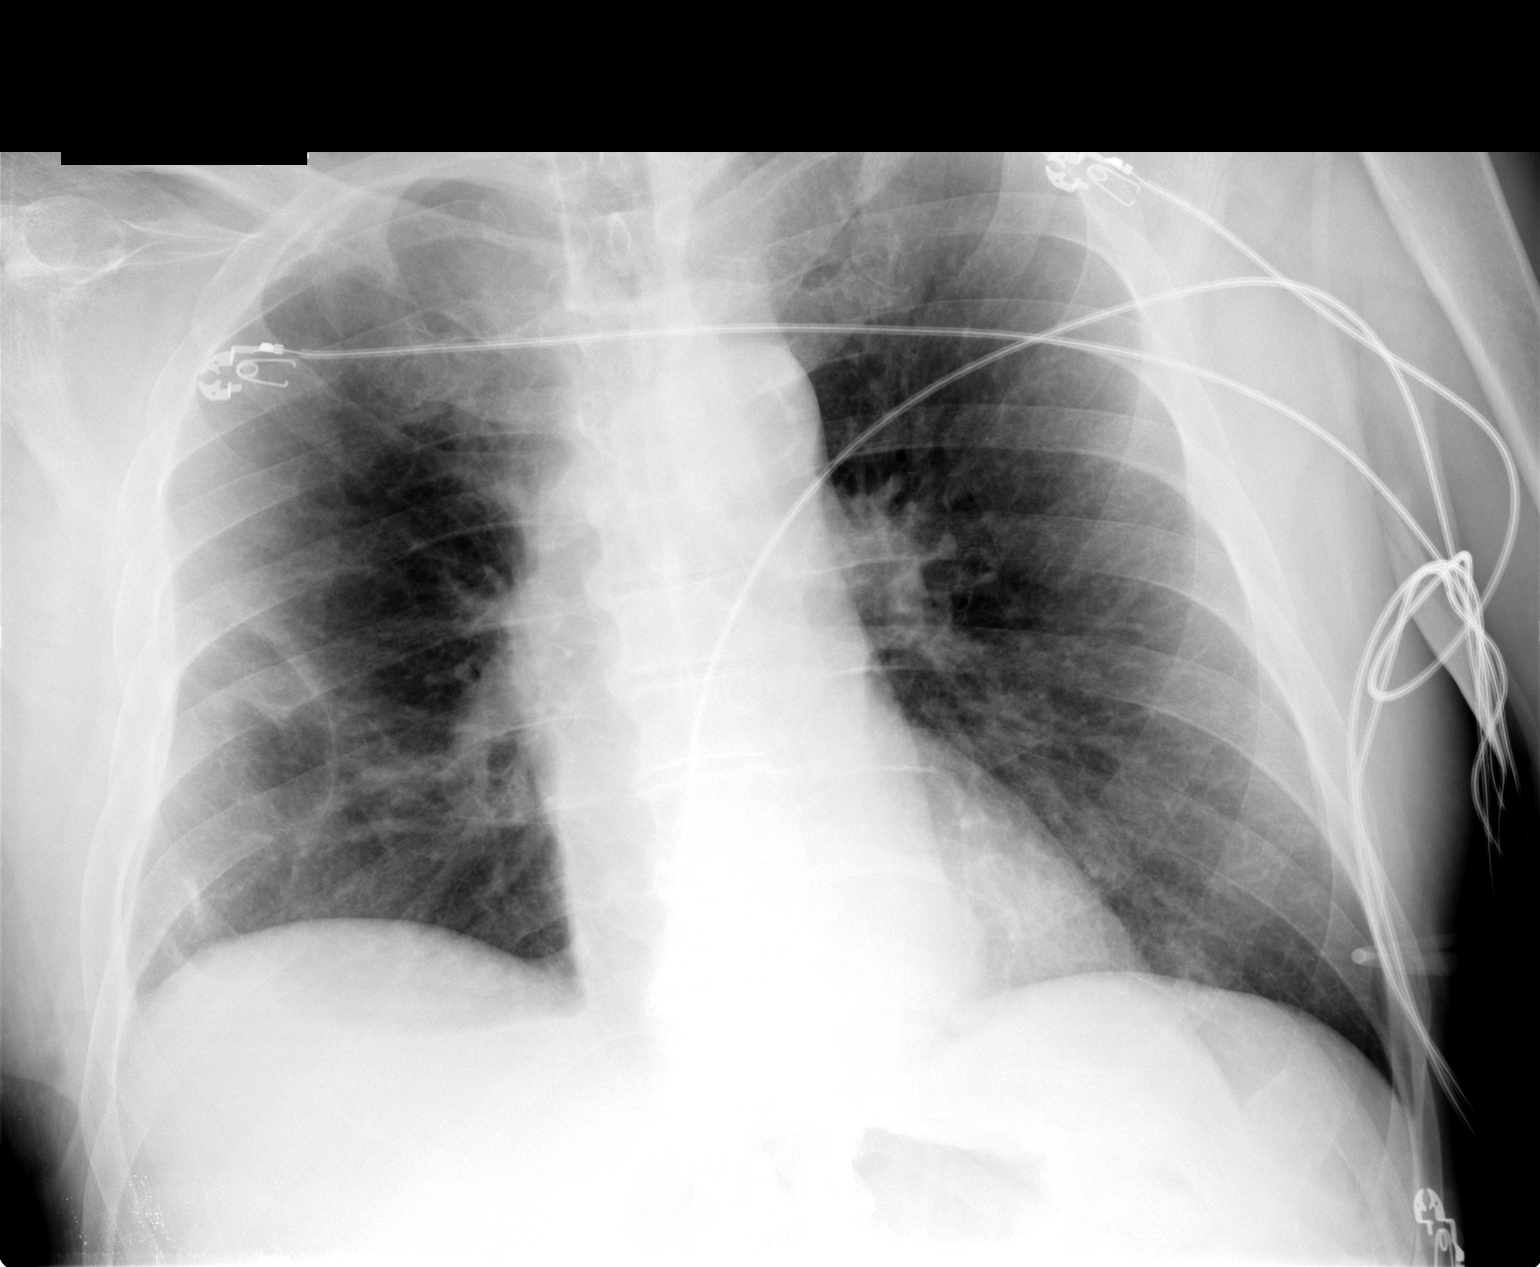

[1 of 1 positions shown; findings below may reference images not displayed]

FINDINGS: Right lung bullous disease is present at the lateral
base.  There is no pneumothorax identified.  Cardiopericardial
silhouette appears within normal limits. Monitoring leads are
projected over the chest.  Faint patchy density is present at the
left lung base, probably representing atelectasis.  No focal
consolidation.  Trachea midline.  Mediastinal contours are within
normal limits.  Aortic arch atherosclerosis. Monitoring leads are
projected over the chest. Retrocardiac density is present, possibly
representing either tortuous thoracic aorta or small hiatal hernia.
Question small right pleural effusion.  Lateral view may be helpful
in further evaluation.
IMPRESSION: 1.  Left basilar density, probably represents atelectasis.
Pneumonia is considered less likely.
2.  Possible small right pleural effusion versus flattening of the
hemidiaphragm associated with emphysema.

## 2013-12-12 ENCOUNTER — Other Ambulatory Visit (HOSPITAL_COMMUNITY): Payer: Self-pay | Admitting: Urology

## 2013-12-12 ENCOUNTER — Ambulatory Visit (HOSPITAL_COMMUNITY)
Admission: RE | Admit: 2013-12-12 | Discharge: 2013-12-12 | Disposition: A | Discharge status: Home / Self Care | Payer: Medicare Other | Source: Ambulatory Visit | Attending: Urology | Admitting: Urology

## 2013-12-12 DIAGNOSIS — I10 Essential (primary) hypertension: Secondary | ICD-10-CM | POA: Insufficient documentation

## 2013-12-12 DIAGNOSIS — C799 Secondary malignant neoplasm of unspecified site: Secondary | ICD-10-CM

## 2013-12-12 DIAGNOSIS — C679 Malignant neoplasm of bladder, unspecified: Secondary | ICD-10-CM | POA: Diagnosis not present

## 2013-12-12 DIAGNOSIS — K7689 Other specified diseases of liver: Secondary | ICD-10-CM | POA: Diagnosis not present

## 2013-12-12 DIAGNOSIS — J984 Other disorders of lung: Secondary | ICD-10-CM | POA: Diagnosis not present

## 2013-12-12 DIAGNOSIS — C801 Malignant (primary) neoplasm, unspecified: Secondary | ICD-10-CM | POA: Diagnosis not present

## 2013-12-13 DIAGNOSIS — C801 Malignant (primary) neoplasm, unspecified: Secondary | ICD-10-CM | POA: Diagnosis not present

## 2013-12-13 DIAGNOSIS — C679 Malignant neoplasm of bladder, unspecified: Secondary | ICD-10-CM | POA: Diagnosis not present

## 2014-05-21 DIAGNOSIS — I10 Essential (primary) hypertension: Secondary | ICD-10-CM | POA: Diagnosis not present

## 2014-05-21 DIAGNOSIS — C679 Malignant neoplasm of bladder, unspecified: Secondary | ICD-10-CM | POA: Diagnosis not present

## 2014-05-21 DIAGNOSIS — B360 Pityriasis versicolor: Secondary | ICD-10-CM | POA: Diagnosis not present

## 2014-08-26 ENCOUNTER — Other Ambulatory Visit: Payer: Self-pay

## 2014-09-04 ENCOUNTER — Other Ambulatory Visit: Payer: Self-pay | Admitting: Urology

## 2014-09-04 ENCOUNTER — Ambulatory Visit (HOSPITAL_COMMUNITY)
Admission: RE | Admit: 2014-09-04 | Discharge: 2014-09-04 | Disposition: A | Discharge status: Home / Self Care | Payer: Medicare Other | Source: Ambulatory Visit | Attending: Urology | Admitting: Urology

## 2014-09-04 DIAGNOSIS — C679 Malignant neoplasm of bladder, unspecified: Secondary | ICD-10-CM

## 2014-09-04 DIAGNOSIS — Z8551 Personal history of malignant neoplasm of bladder: Secondary | ICD-10-CM | POA: Insufficient documentation

## 2014-09-04 DIAGNOSIS — K760 Fatty (change of) liver, not elsewhere classified: Secondary | ICD-10-CM | POA: Diagnosis not present

## 2014-09-04 DIAGNOSIS — K449 Diaphragmatic hernia without obstruction or gangrene: Secondary | ICD-10-CM | POA: Diagnosis not present

## 2014-09-04 DIAGNOSIS — J984 Other disorders of lung: Secondary | ICD-10-CM | POA: Diagnosis not present

## 2014-09-05 DIAGNOSIS — C679 Malignant neoplasm of bladder, unspecified: Secondary | ICD-10-CM | POA: Diagnosis not present

## 2014-09-05 DIAGNOSIS — C801 Malignant (primary) neoplasm, unspecified: Secondary | ICD-10-CM | POA: Diagnosis not present

## 2014-10-29 DIAGNOSIS — I10 Essential (primary) hypertension: Secondary | ICD-10-CM | POA: Diagnosis not present

## 2014-10-29 DIAGNOSIS — R5383 Other fatigue: Secondary | ICD-10-CM | POA: Diagnosis not present

## 2014-10-29 DIAGNOSIS — C679 Malignant neoplasm of bladder, unspecified: Secondary | ICD-10-CM | POA: Diagnosis not present

## 2014-10-29 DIAGNOSIS — K210 Gastro-esophageal reflux disease with esophagitis: Secondary | ICD-10-CM | POA: Diagnosis not present

## 2015-01-16 DIAGNOSIS — C679 Malignant neoplasm of bladder, unspecified: Secondary | ICD-10-CM | POA: Diagnosis not present

## 2015-01-16 DIAGNOSIS — D649 Anemia, unspecified: Secondary | ICD-10-CM | POA: Diagnosis not present

## 2015-01-16 DIAGNOSIS — I10 Essential (primary) hypertension: Secondary | ICD-10-CM | POA: Diagnosis not present

## 2015-01-16 DIAGNOSIS — G4700 Insomnia, unspecified: Secondary | ICD-10-CM | POA: Diagnosis not present

## 2015-01-20 IMAGING — CR DG CHEST 2V
2 series · 2 of 2 positions shown · non-contrast
Comparison: 02/07/2012

CLINICAL DATA: Hypertension.  History of bladder carcinoma.

EXAM:
CHEST  2 VIEW

[w chest pa]
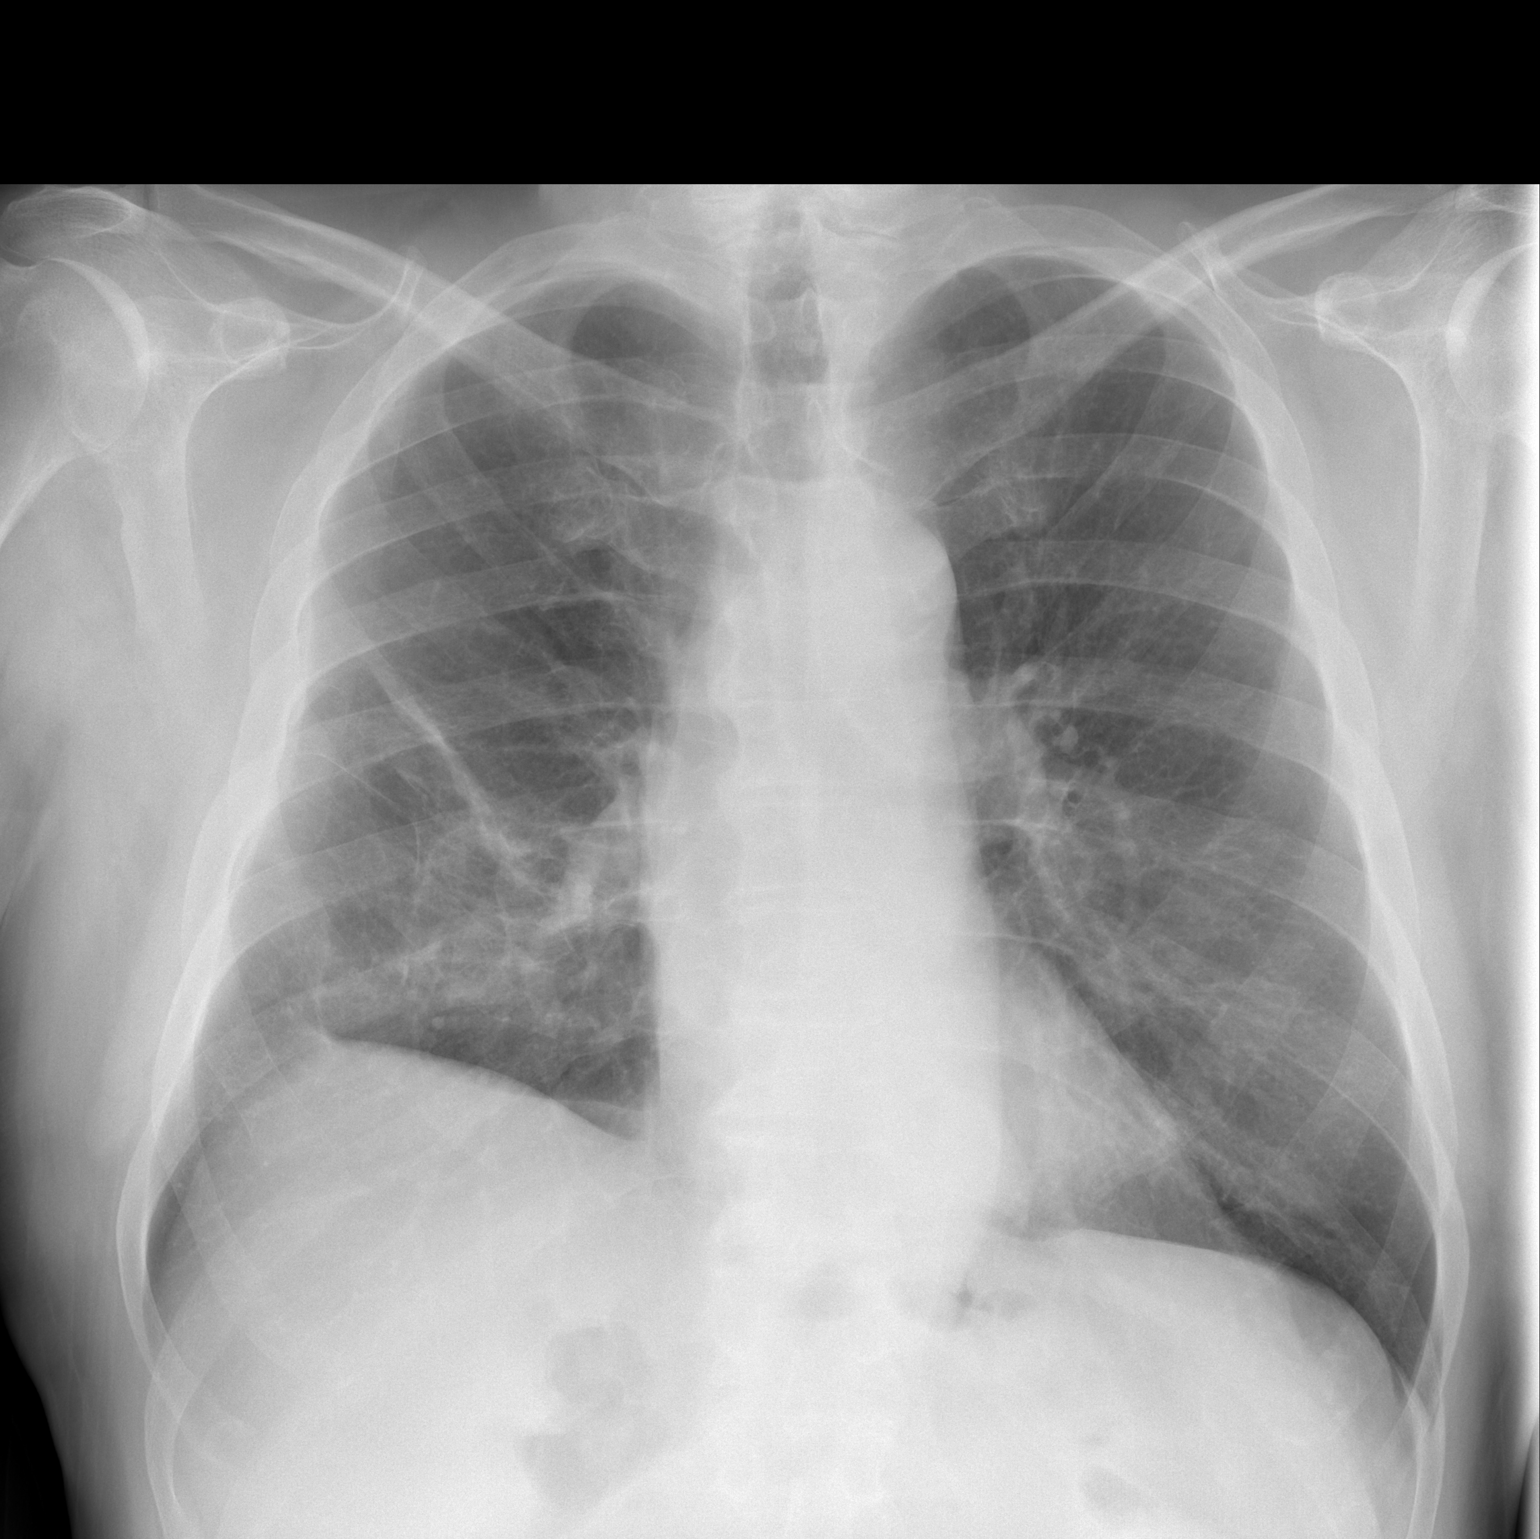

[w chest lat]
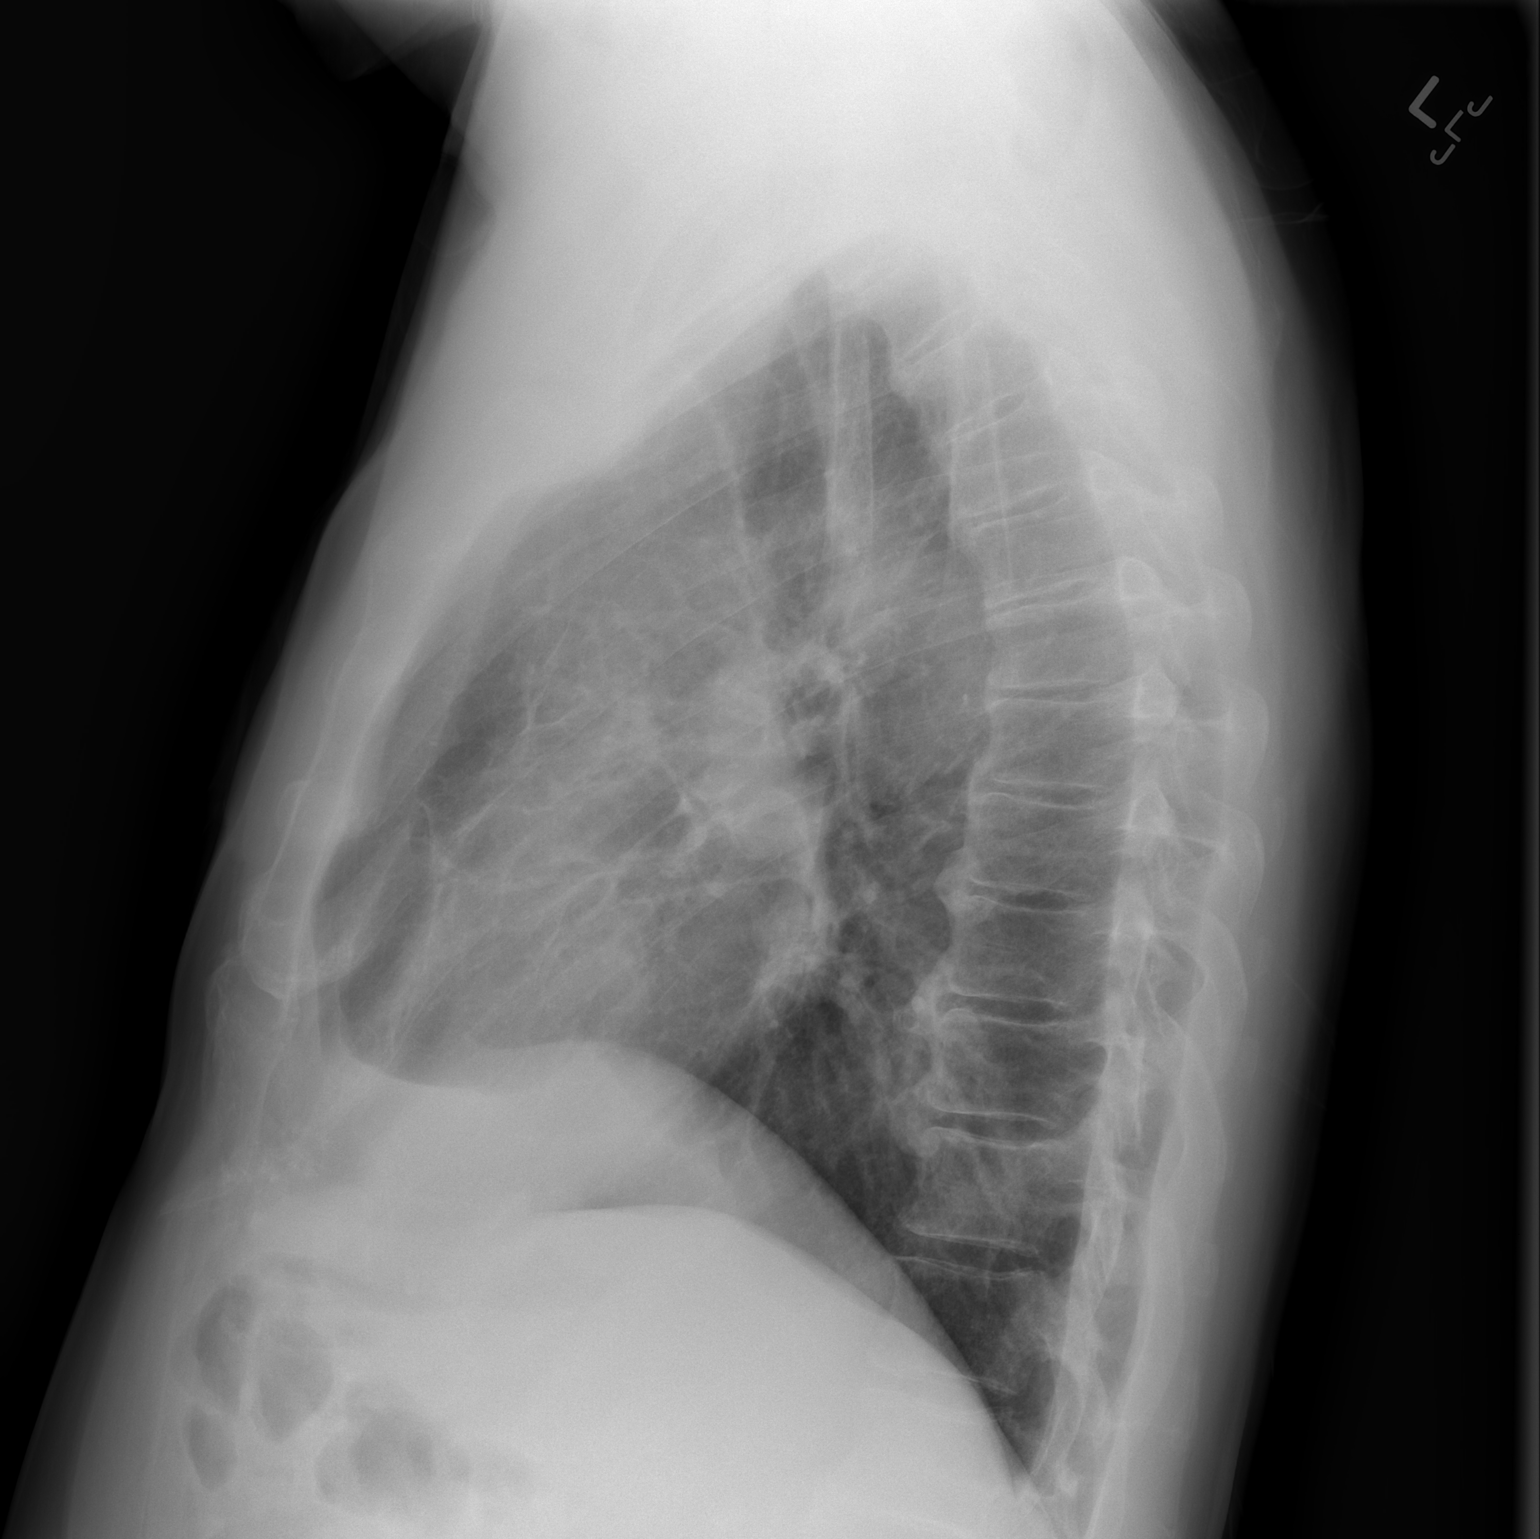

[2 of 2 positions shown; findings below may reference images not displayed]

FINDINGS: Right mid and lower lung scarring is stable. Lungs are otherwise
clear.

Cardiac silhouette is normal in size. Normal mediastinal and hilar
contours.

No pleural effusion or pneumothorax.

The bony thorax is demineralized but intact.
IMPRESSION: No acute cardiopulmonary disease.

## 2015-03-21 ENCOUNTER — Ambulatory Visit (HOSPITAL_COMMUNITY)
Admission: RE | Admit: 2015-03-21 | Discharge: 2015-03-21 | Disposition: A | Discharge status: Home / Self Care | Payer: Medicare Other | Source: Ambulatory Visit | Attending: Urology | Admitting: Urology

## 2015-03-21 ENCOUNTER — Other Ambulatory Visit: Payer: Self-pay | Admitting: Urology

## 2015-03-21 DIAGNOSIS — C679 Malignant neoplasm of bladder, unspecified: Secondary | ICD-10-CM | POA: Insufficient documentation

## 2015-03-21 DIAGNOSIS — Z8551 Personal history of malignant neoplasm of bladder: Secondary | ICD-10-CM | POA: Diagnosis not present

## 2015-03-21 DIAGNOSIS — C801 Malignant (primary) neoplasm, unspecified: Secondary | ICD-10-CM | POA: Diagnosis not present

## 2015-03-25 DIAGNOSIS — C801 Malignant (primary) neoplasm, unspecified: Secondary | ICD-10-CM | POA: Diagnosis not present

## 2015-03-25 DIAGNOSIS — Z8551 Personal history of malignant neoplasm of bladder: Secondary | ICD-10-CM | POA: Diagnosis not present

## 2015-03-25 DIAGNOSIS — Z0000 Encounter for general adult medical examination without abnormal findings: Secondary | ICD-10-CM | POA: Diagnosis not present

## 2015-07-15 DIAGNOSIS — C679 Malignant neoplasm of bladder, unspecified: Secondary | ICD-10-CM | POA: Diagnosis not present

## 2015-07-15 DIAGNOSIS — I10 Essential (primary) hypertension: Secondary | ICD-10-CM | POA: Diagnosis not present

## 2015-07-15 DIAGNOSIS — M2550 Pain in unspecified joint: Secondary | ICD-10-CM | POA: Diagnosis not present

## 2015-07-15 DIAGNOSIS — T148 Other injury of unspecified body region: Secondary | ICD-10-CM | POA: Diagnosis not present

## 2015-07-17 DIAGNOSIS — S2096XA Insect bite (nonvenomous) of unspecified parts of thorax, initial encounter: Secondary | ICD-10-CM | POA: Diagnosis not present

## 2015-07-17 DIAGNOSIS — I10 Essential (primary) hypertension: Secondary | ICD-10-CM | POA: Diagnosis not present

## 2015-07-17 DIAGNOSIS — C679 Malignant neoplasm of bladder, unspecified: Secondary | ICD-10-CM | POA: Diagnosis not present

## 2015-07-18 ENCOUNTER — Other Ambulatory Visit (HOSPITAL_COMMUNITY): Payer: Self-pay | Admitting: Pulmonary Disease

## 2015-07-18 ENCOUNTER — Ambulatory Visit (HOSPITAL_COMMUNITY)
Admission: RE | Admit: 2015-07-18 | Discharge: 2015-07-18 | Disposition: A | Discharge status: Home / Self Care | Payer: Medicare Other | Source: Ambulatory Visit | Attending: Pulmonary Disease | Admitting: Pulmonary Disease

## 2015-07-18 DIAGNOSIS — M25512 Pain in left shoulder: Secondary | ICD-10-CM | POA: Diagnosis not present

## 2015-07-18 DIAGNOSIS — M160 Bilateral primary osteoarthritis of hip: Secondary | ICD-10-CM | POA: Diagnosis not present

## 2015-07-18 DIAGNOSIS — M19012 Primary osteoarthritis, left shoulder: Secondary | ICD-10-CM | POA: Insufficient documentation

## 2015-07-18 DIAGNOSIS — M25511 Pain in right shoulder: Secondary | ICD-10-CM | POA: Diagnosis not present

## 2015-07-18 DIAGNOSIS — M25552 Pain in left hip: Principal | ICD-10-CM

## 2015-07-18 DIAGNOSIS — M479 Spondylosis, unspecified: Secondary | ICD-10-CM | POA: Insufficient documentation

## 2015-07-18 DIAGNOSIS — M24011 Loose body in right shoulder: Secondary | ICD-10-CM | POA: Diagnosis not present

## 2015-07-18 DIAGNOSIS — M25551 Pain in right hip: Secondary | ICD-10-CM | POA: Diagnosis not present

## 2015-07-18 DIAGNOSIS — M19011 Primary osteoarthritis, right shoulder: Secondary | ICD-10-CM | POA: Diagnosis not present

## 2015-07-31 ENCOUNTER — Encounter: Payer: Self-pay | Admitting: Orthopaedic Surgery

## 2015-07-31 ENCOUNTER — Ambulatory Visit (INDEPENDENT_AMBULATORY_CARE_PROVIDER_SITE_OTHER): Payer: Medicare Other | Admitting: Orthopaedic Surgery

## 2015-07-31 VITALS — BP 155/80 | HR 78 | Temp 97.5°F | Ht 74.0 in | Wt 214.0 lb

## 2015-07-31 DIAGNOSIS — M25511 Pain in right shoulder: Secondary | ICD-10-CM

## 2015-07-31 NOTE — Progress Notes (Signed)
Subjective:  My right shoulder hurts    Patient ID: Benjamin Yang, male    DOB: Oct 10, 1935, 80 y.o.   MRN: [id number]  Shoulder Pain  The pain is present in the right shoulder. This is a new problem. The current episode started more than 1 month ago. There has been no history of extremity trauma. The problem occurs daily. The problem has been gradually worsening. The quality of the pain is described as aching. The pain is at a severity of 5/10. The pain is moderate. The symptoms are aggravated by lying down and activity. He has tried NSAIDS, rest, cold and heat for the symptoms. The treatment provided mild relief.   Over the last six weeks he has had increasing pain of the right shoulder.  It hurts to raise his hand over his head or behind him.  He has no trauma, no redness, no numbness.  It has no swelling.  He has taken ibuprofen with slight help.   Ice or heat helps just a short time.  He has seen Dr. Luan Pulling and took a prednisone pack and got better but it wore off.  He had x-rays done 5-19 of both shoulders showing no acute abnormality.  He had hip pain bilaterally about three months ago but it resolved.   Review of Systems  HENT: Negative for congestion.   Respiratory: Negative for cough and shortness of breath.   Cardiovascular: Negative for chest pain and leg swelling.  Endocrine: Negative for cold intolerance.  Musculoskeletal: Positive for arthralgias.  Allergic/Immunologic: Negative for environmental allergies.   Past Medical History  Diagnosis Date  . Prostatitis   . Hypertension     Past Surgical History  Procedure Laterality Date  . Back surgery    . Robot assisted laparoscopic complete cystect ileal conduit N/A 09/15/2012    Procedure: Robotic Cystoprostatectomy, Open Ileal Conduit Diversion,  Indocyanine Green Dye Injection into Bladder ;  Surgeon: Alexis Frock, MD;  Location: [facility];  Service: Urology;  Laterality: N/A;  Robotic Cystoprostatectomy, Bilateral  Pelvic Lymph Node Dissection, Open Ileal Conduit Diversion, Cysto, Indocyanine Green Dye Injection into Bladder    . Lymphadenectomy Bilateral 09/15/2012    Procedure:  Bilateral Pelvic Lymph Node Dissection;  Surgeon: Alexis Frock, MD;  Location: [facility];  Service: Urology;  Laterality: Bilateral;  . Cystoscopy N/A 09/15/2012    Procedure: CYSTOSCOPY;  Surgeon: Alexis Frock, MD;  Location: [facility];  Service: Urology;  Laterality: N/A;  . Robotic assisted laparoscopic lysis of adhesion N/A 09/15/2012    Procedure: ROBOTIC ASSISTED LAPAROSCOPIC LYSIS OF ADHESION;  Surgeon: Alexis Frock, MD;  Location: [facility];  Service: Urology;  Laterality: N/A;    Current Outpatient Prescriptions on File Prior to Visit  Medication Sig Dispense Refill  . amLODipine (NORVASC) 10 MG tablet Take 10 mg by mouth daily.    Marland Kitchen LORazepam (ATIVAN) 0.5 MG tablet Take 0.5 mg by mouth every 8 (eight) hours.    . senna-docusate (SENOKOT-S) 8.6-50 MG per tablet Take 1 tablet by mouth 2 (two) times daily. While taking pain meds to prevent constipation, 30 tablet 1  . telmisartan (MICARDIS) 80 MG tablet Take 80 mg by mouth at bedtime.    . vitamin C (ASCORBIC ACID) 500 MG tablet Take 500 mg by mouth daily.     No current facility-administered medications on file prior to visit.    Social History   Social History  . Marital Status: Married    Spouse Name: N/A  . Number  of Children: N/A  . Years of Education: N/A   Occupational History  . Not on file.   Social History Main Topics  . Smoking status: Never Smoker   . Smokeless tobacco: Not on file  . Alcohol Use: Yes  . Drug Use: No  . Sexual Activity: Not on file   Other Topics Concern  . Not on file   Social History Narrative    BP 155/80 mmHg  Pulse 78  Temp(Src) 97.5 F (36.4 C)  Ht 6\' 2"  (1.88 m)  Wt 214 lb (97.07 kg)  BMI 27.46 kg/m2     Objective:   Physical Exam  Constitutional: He is oriented to person, place, and time. He appears  well-developed and well-nourished.  HENT:  Head: Normocephalic and atraumatic.  Eyes: Conjunctivae and EOM are normal. Pupils are equal, round, and reactive to light.  Neck: Normal range of motion. Neck supple.  Cardiovascular: Normal rate, regular rhythm and intact distal pulses.   Pulmonary/Chest: Effort normal.  Abdominal: Soft.  Musculoskeletal: He exhibits tenderness (Right shoulder pain, no effusion, decreased motion (see other section).  Left shoulder full motion.  NV intact.).  Neurological: He is alert and oriented to person, place, and time. He has normal reflexes. No cranial nerve deficit. He exhibits normal muscle tone. Coordination normal.  Skin: Skin is warm and dry.  Psychiatric: He has a normal mood and affect. His behavior is normal. Judgment and thought content normal.   Examination of right Upper Extremity is done.  Inspection:   Overall:  Elbow non-tender without crepitus or defects, forearm non-tender without crepitus or defects, wrist non-tender without crepitus or defects, hand non-tender.    Shoulder: with glenohumeral joint tenderness, without effusion.   Upper arm: without swelling and tenderness   Range of motion:   Overall:  Full range of motion of the elbow, full range of motion of wrist and full range of motion in fingers.   Shoulder:  right  160 degrees forward flexion; 145 degrees abduction; 35 degrees internal rotation, 35 degrees external rotation, 20 degrees extension, 40 degrees adduction.   Stability:   Overall:  Shoulder, elbow and wrist stable   Strength and Tone:   Overall full shoulder muscles strength, full upper arm strength and normal upper arm bulk and tone.         Assessment & Plan:   Encounter Diagnosis  Name Primary?  . Right shoulder pain Yes    I have recommended Aleve one or two twice a day after eating.  If he gets worse, consider injection.  I have shown him some exercises to do as well.  Call if any  problem.  Electronically Signed Sanjuana Kava, MD 6/1/20171:53 PM

## 2015-07-31 NOTE — Patient Instructions (Addendum)
Advised to take over the counter Aleve twice a day.  Precautions discussed.  Exercises discussed for shoulder extension.

## 2015-08-14 ENCOUNTER — Ambulatory Visit (INDEPENDENT_AMBULATORY_CARE_PROVIDER_SITE_OTHER): Payer: Medicare Other | Admitting: Orthopaedic Surgery

## 2015-08-14 ENCOUNTER — Encounter: Payer: Self-pay | Admitting: Orthopaedic Surgery

## 2015-08-14 VITALS — BP 126/71 | HR 79 | Temp 97.9°F | Ht 74.0 in | Wt 214.0 lb

## 2015-08-14 DIAGNOSIS — M25511 Pain in right shoulder: Secondary | ICD-10-CM | POA: Diagnosis not present

## 2015-08-14 DIAGNOSIS — M25512 Pain in left shoulder: Secondary | ICD-10-CM

## 2015-08-14 NOTE — Progress Notes (Signed)
Patient [id number] Benjamin Yang, male DOB:[birth year], 80 y.o. [id number]  Chief Complaint  Patient presents with  . Follow-up    Bilateral shoulder pain    HPI  Benjamin Yang is a 80 y.o. male who has had pain in the right shoulder.  I felt it was bursitis.  I had him take Aleve and do some limited exercises.  He has no relief of the right shoulder and now has pain of the left shoulder.  The left shoulder has started hurting like the right.  He has no paresthesias.  He has problems with overhead use and in sleeping.  The last few days the left shoulder has been very painful.  He has used ice, heat, rest with little help.  He wants to consider an injection today.  HPI  Body mass index is 27.46 kg/(m^2).  ROS  Review of Systems  HENT: Negative for congestion.   Respiratory: Negative for cough and shortness of breath.   Cardiovascular: Negative for chest pain and leg swelling.  Endocrine: Negative for cold intolerance.  Musculoskeletal: Positive for arthralgias.  Allergic/Immunologic: Negative for environmental allergies.    Past Medical History  Diagnosis Date  . Prostatitis   . Hypertension     Past Surgical History  Procedure Laterality Date  . Back surgery    . Robot assisted laparoscopic complete cystect ileal conduit N/A 09/15/2012    Procedure: Robotic Cystoprostatectomy, Open Ileal Conduit Diversion,  Indocyanine Green Dye Injection into Bladder ;  Surgeon: Alexis Frock, MD;  Location: [facility];  Service: Urology;  Laterality: N/A;  Robotic Cystoprostatectomy, Bilateral Pelvic Lymph Node Dissection, Open Ileal Conduit Diversion, Cysto, Indocyanine Green Dye Injection into Bladder    . Lymphadenectomy Bilateral 09/15/2012    Procedure:  Bilateral Pelvic Lymph Node Dissection;  Surgeon: Alexis Frock, MD;  Location: [facility];  Service: Urology;  Laterality: Bilateral;  . Cystoscopy N/A 09/15/2012    Procedure: CYSTOSCOPY;  Surgeon: Alexis Frock, MD;  Location: [facility];   Service: Urology;  Laterality: N/A;  . Robotic assisted laparoscopic lysis of adhesion N/A 09/15/2012    Procedure: ROBOTIC ASSISTED LAPAROSCOPIC LYSIS OF ADHESION;  Surgeon: Alexis Frock, MD;  Location: [facility];  Service: Urology;  Laterality: N/A;    History reviewed. No pertinent family history.  Social History Social History  Substance Use Topics  . Smoking status: Never Smoker   . Smokeless tobacco: None  . Alcohol Use: Yes    Allergies  Allergen Reactions  . Percocet [Oxycodone-Acetaminophen]     Patient says he can take this for pain-just bothered his stomach a little bit before    Current Outpatient Prescriptions  Medication Sig Dispense Refill  . amLODipine (NORVASC) 10 MG tablet Take 10 mg by mouth daily.    Marland Kitchen LORazepam (ATIVAN) 0.5 MG tablet Take 0.5 mg by mouth every 8 (eight) hours.    Marland Kitchen omeprazole (PRILOSEC) 20 MG capsule     . senna-docusate (SENOKOT-S) 8.6-50 MG per tablet Take 1 tablet by mouth 2 (two) times daily. While taking pain meds to prevent constipation, 30 tablet 1  . telmisartan (MICARDIS) 80 MG tablet Take 80 mg by mouth at bedtime.    . vitamin C (ASCORBIC ACID) 500 MG tablet Take 500 mg by mouth daily.     No current facility-administered medications for this visit.     Physical Exam  Blood pressure 126/71, pulse 79, temperature 97.9 F (36.6 C), height 6\' 2"  (1.88 m), weight 214 lb (97.07 kg).  Constitutional: overall normal  hygiene, normal nutrition, well developed, normal grooming, normal body habitus. Assistive device:none  Musculoskeletal: gait and station Limp none, muscle tone and strength are normal, no tremors or atrophy is present.  .  Neurological: coordination overall normal.  Deep tendon reflex/nerve stretch intact.  Sensation normal.  Cranial nerves II-XII intact.   Skin:   normal overall no scars, lesions, ulcers or rashes. No psoriasis.  Psychiatric: Alert and oriented x 3.  Recent memory intact, remote memory unclear.   Normal mood and affect. Well groomed.  Good eye contact.  Cardiovascular: overall no swelling, no varicosities, no edema bilaterally, normal temperatures of the legs and arms, no clubbing, cyanosis and good capillary refill.  Lymphatic: palpation is normal.  Right Shoulder Exam   Tenderness  The patient is experiencing tenderness in the biceps tendon.  Range of Motion  The patient has normal right shoulder ROM.  Muscle Strength  The patient has normal right shoulder strength.  Tests  Impingement: positive  Other  Erythema: absent Scars: absent Sensation: normal Pulse: present   Left Shoulder Exam   Tenderness  The patient is experiencing tenderness in the biceps tendon.  Range of Motion  Active Abduction: 90  Passive Abduction: 110  Extension: 10  Forward Flexion: 140  External Rotation: 20   Muscle Strength  The patient has normal left shoulder strength.  Tests  Impingement: positive  Other  Erythema: absent Scars: absent Sensation: normal Pulse: present   Comments:  Left shoulder very tender, no effusion.  NV intact.      PROCEDURE NOTE:  The patient request injection, verbal consent was obtained.  The right shoulder was prepped appropriately after time out was performed.   Sterile technique was observed and injection of 1 cc of Depo-Medrol 40 mg with several cc's of plain xylocaine. Anesthesia was provided by ethyl chloride and a 20-gauge needle was used to inject the shoulder area. A posterior approach was used.  The injection was tolerated well.  A band aid dressing was applied.  The patient was advised to apply ice later today and tomorrow to the injection sight as needed.  PROCEDURE NOTE:  The patient request injection, verbal consent was obtained.  The left shoulder was prepped appropriately after time out was performed.   Sterile technique was observed and injection of 1 cc of Depo-Medrol 40 mg with several cc's of plain xylocaine.  Anesthesia was provided by ethyl chloride and a 20-gauge needle was used to inject the shoulder area. A posterior approach was used.  The injection was tolerated well.  A band aid dressing was applied.  The patient was advised to apply ice later today and tomorrow to the injection sight as needed. The patient has been educated about the nature of the problem(s) and counseled on treatment options.  The patient appeared to understand what I have discussed and is in agreement with it.  Encounter Diagnoses  Name Primary?  . Right shoulder pain Yes  . Left shoulder pain     PLAN Call if any problems.  Precautions discussed.  Continue current medications.   Return to clinic 1 month   Exercises were shown and exercise sheet given.  Electronically Signed Sanjuana Kava, MD 6/15/20179:02 AM

## 2015-09-11 ENCOUNTER — Encounter: Payer: Self-pay | Admitting: Orthopaedic Surgery

## 2015-09-11 ENCOUNTER — Ambulatory Visit (INDEPENDENT_AMBULATORY_CARE_PROVIDER_SITE_OTHER): Payer: Medicare Other | Admitting: Orthopaedic Surgery

## 2015-09-11 VITALS — BP 149/83 | HR 75 | Temp 98.1°F | Ht 73.0 in | Wt 212.4 lb

## 2015-09-11 DIAGNOSIS — M25512 Pain in left shoulder: Secondary | ICD-10-CM

## 2015-09-11 DIAGNOSIS — M25511 Pain in right shoulder: Secondary | ICD-10-CM | POA: Diagnosis not present

## 2015-09-11 MED ORDER — PREDNISONE 5 MG (21) PO TBPK: ORAL_TABLET | ORAL | Status: DC

## 2015-09-11 NOTE — Progress Notes (Signed)
Patient IN:[id number] Benjamin Yang, male DOB:Jun 09, 1935, 80 y.o. [id number]  Chief Complaint  Patient presents with  . Follow-up    Bilateral shoulder pain    HPI  Benjamin Yang is a 80 y.o. male who has bilateral shoulder pain,now more on the left.  He has pain when he awakens and it takes him a few minutes to get his shoulders moving without pain. He has had some numbness of the right hand at night and numbness of the little finger and part of the ring finger.  He has no elbow pain.  This pain is new and is bothering him. It is not consistent but happens more and more.  He shakes his hand and the numbness goes away.  I told him he may need EMG of the arm.  He may need MRI of the neck.  I will give prednisone dose pack now. It has helped in the past.  HPI  Body mass index is 28.03 kg/(m^2).  ROS  Review of Systems  HENT: Negative for congestion.   Respiratory: Negative for cough and shortness of breath.   Cardiovascular: Negative for chest pain and leg swelling.  Endocrine: Negative for cold intolerance.  Musculoskeletal: Positive for arthralgias.  Allergic/Immunologic: Negative for environmental allergies.    Past Medical History  Diagnosis Date  . Prostatitis   . Hypertension     Past Surgical History  Procedure Laterality Date  . Back surgery    . Robot assisted laparoscopic complete cystect ileal conduit N/A 09/15/2012    Procedure: Robotic Cystoprostatectomy, Open Ileal Conduit Diversion,  Indocyanine Green Dye Injection into Bladder ;  Surgeon: Alexis Frock, MD;  Location: [facility];  Service: Urology;  Laterality: N/A;  Robotic Cystoprostatectomy, Bilateral Pelvic Lymph Node Dissection, Open Ileal Conduit Diversion, Cysto, Indocyanine Green Dye Injection into Bladder    . Lymphadenectomy Bilateral 09/15/2012    Procedure:  Bilateral Pelvic Lymph Node Dissection;  Surgeon: Alexis Frock, MD;  Location: [facility];  Service: Urology;  Laterality: Bilateral;  . Cystoscopy N/A  09/15/2012    Procedure: CYSTOSCOPY;  Surgeon: Alexis Frock, MD;  Location: [facility];  Service: Urology;  Laterality: N/A;  . Robotic assisted laparoscopic lysis of adhesion N/A 09/15/2012    Procedure: ROBOTIC ASSISTED LAPAROSCOPIC LYSIS OF ADHESION;  Surgeon: Alexis Frock, MD;  Location: [facility];  Service: Urology;  Laterality: N/A;    No family history on file.  Social History Social History  Substance Use Topics  . Smoking status: Never Smoker   . Smokeless tobacco: None  . Alcohol Use: Yes    Allergies  Allergen Reactions  . Percocet [Oxycodone-Acetaminophen]     Patient says he can take this for pain-just bothered his stomach a little bit before    Current Outpatient Prescriptions  Medication Sig Dispense Refill  . amLODipine (NORVASC) 10 MG tablet Take 10 mg by mouth daily.    Marland Kitchen LORazepam (ATIVAN) 0.5 MG tablet Take 0.5 mg by mouth every 8 (eight) hours.    Marland Kitchen omeprazole (PRILOSEC) 20 MG capsule     . predniSONE (STERAPRED UNI-PAK 21 TAB) 5 MG (21) TBPK tablet Take 6 pills first day; 5 pills second day; 4 pills third day; 3 pills fourth day; 2 pills next day and 1 pill last day. 21 tablet 0  . senna-docusate (SENOKOT-S) 8.6-50 MG per tablet Take 1 tablet by mouth 2 (two) times daily. While taking pain meds to prevent constipation, 30 tablet 1  . telmisartan (MICARDIS) 80 MG tablet Take 80 mg  by mouth at bedtime.    . vitamin C (ASCORBIC ACID) 500 MG tablet Take 500 mg by mouth daily.     No current facility-administered medications for this visit.     Physical Exam  Blood pressure 149/83, pulse 75, temperature 98.1 F (36.7 C), height 6\' 1"  (1.854 m), weight 212 lb 6.4 oz (96.344 kg).  Constitutional: overall normal hygiene, normal nutrition, well developed, normal grooming, normal body habitus. Assistive device:none  Musculoskeletal: gait and station Limp none, muscle tone and strength are normal, no tremors or atrophy is present.  .  Neurological: coordination  overall normal.  Deep tendon reflex/nerve stretch intact.  Sensation normal.  Cranial nerves II-XII intact.   Skin:   normal overall no scars, lesions, ulcers or rashes. No psoriasis.  Psychiatric: Alert and oriented x 3.  Recent memory intact, remote memory unclear.  Normal mood and affect. Well groomed.  Good eye contact.  Cardiovascular: overall no swelling, no varicosities, no edema bilaterally, normal temperatures of the legs and arms, no clubbing, cyanosis and good capillary refill.  Lymphatic: palpation is normal.  He has negative Tinels over the right elbow.  He has slight decreased sensation of the right little finger.  Intrinsics are weak on the right.  He has old injury on the left hand and arm and has had long standing weakness of the little finger and hand on the left.  ROM of the right elbow is full.  ROM of both shoulders is full today.  Muscle tone and strength is normal.  The patient has been educated about the nature of the problem(s) and counseled on treatment options.  The patient appeared to understand what I have discussed and is in agreement with it.  Encounter Diagnoses  Name Primary?  . Right shoulder pain Yes  . Left shoulder pain     PLAN Call if any problems.  Precautions discussed.  Continue current medications.   Return to clinic 1 month   I will begin prednisone dose pack.  Consider EMG if not improved with the right ulnar nerve tardy symptoms.  Electronically Signed Sanjuana Kava, MD 7/13/201711:26 AM

## 2015-10-23 ENCOUNTER — Ambulatory Visit (INDEPENDENT_AMBULATORY_CARE_PROVIDER_SITE_OTHER): Payer: Medicare Other | Admitting: Orthopaedic Surgery

## 2015-10-23 ENCOUNTER — Encounter: Payer: Self-pay | Admitting: Orthopaedic Surgery

## 2015-10-23 VITALS — BP 141/82 | HR 80 | Temp 97.5°F | Ht 75.0 in | Wt 213.0 lb

## 2015-10-23 DIAGNOSIS — M1990 Unspecified osteoarthritis, unspecified site: Secondary | ICD-10-CM

## 2015-10-23 MED ORDER — PREDNISONE 5 MG (21) PO TBPK: ORAL_TABLET | ORAL | 0 refills | Status: DC

## 2015-10-23 NOTE — Progress Notes (Signed)
Patient [id number] Benjamin Yang, male DOB:[birth year], 80 y.o. [id number]  Chief Complaint  Patient presents with  . Follow-up    bilateral shoulder pain    HPI  Benjamin Yang is a 80 y.o. male who has had pain in both shoulders.  He did well from the prednisone dose pack.  Now he has pain of both hands and fingers first thing in the morning.  He has some neck pain.  He has knee pain, more on the right.  In other words, he has multiple arthralgias.  He takes 600 mgm of Advil twice a day and it helps.  He is concerned about having more joint involvement and pain.  I will have him see a rheumatologist.  He is agreeable to this. Dr. Luan Pulling had done some blood work and that will be sent as well. He is to continue the Advil.  I will give him one more dose pack. HPI  Body mass index is 26.62 kg/m.  ROS  Review of Systems  HENT: Negative for congestion.   Respiratory: Negative for cough and shortness of breath.   Cardiovascular: Negative for chest pain and leg swelling.  Endocrine: Negative for cold intolerance.  Musculoskeletal: Positive for arthralgias.  Allergic/Immunologic: Negative for environmental allergies.    Past Medical History:  Diagnosis Date  . Hypertension   . Prostatitis     Past Surgical History:  Procedure Laterality Date  . BACK SURGERY    . CYSTOSCOPY N/A 09/15/2012   Procedure: CYSTOSCOPY;  Surgeon: Alexis Frock, MD;  Location: [facility];  Service: Urology;  Laterality: N/A;  . LYMPHADENECTOMY Bilateral 09/15/2012   Procedure:  Bilateral Pelvic Lymph Node Dissection;  Surgeon: Alexis Frock, MD;  Location: [facility];  Service: Urology;  Laterality: Bilateral;  . ROBOT ASSISTED LAPAROSCOPIC COMPLETE CYSTECT ILEAL CONDUIT N/A 09/15/2012   Procedure: Robotic Cystoprostatectomy, Open Ileal Conduit Diversion,  Indocyanine Green Dye Injection into Bladder ;  Surgeon: Alexis Frock, MD;  Location: [facility];  Service: Urology;  Laterality: N/A;  Robotic Cystoprostatectomy,  Bilateral Pelvic Lymph Node Dissection, Open Ileal Conduit Diversion, Cysto, Indocyanine Green Dye Injection into Bladder    . ROBOTIC ASSISTED LAPAROSCOPIC LYSIS OF ADHESION N/A 09/15/2012   Procedure: ROBOTIC ASSISTED LAPAROSCOPIC LYSIS OF ADHESION;  Surgeon: Alexis Frock, MD;  Location: [facility];  Service: Urology;  Laterality: N/A;    No family history on file.  Social History Social History  Substance Use Topics  . Smoking status: Never Smoker  . Smokeless tobacco: Not on file  . Alcohol use Yes    Allergies  Allergen Reactions  . Percocet [Oxycodone-Acetaminophen]     Patient says he can take this for pain-just bothered his stomach a little bit before    Current Outpatient Prescriptions  Medication Sig Dispense Refill  . amLODipine (NORVASC) 10 MG tablet Take 10 mg by mouth daily.    . IRON PO Take by mouth.    Marland Kitchen LORazepam (ATIVAN) 0.5 MG tablet Take 0.5 mg by mouth every 8 (eight) hours.    Marland Kitchen omeprazole (PRILOSEC) 20 MG capsule     . predniSONE (STERAPRED UNI-PAK 21 TAB) 5 MG (21) TBPK tablet Take 6 pills first day; 5 pills second day; 4 pills third day; 3 pills fourth day; 2 pills next day and 1 pill last day. 21 tablet 0  . senna-docusate (SENOKOT-S) 8.6-50 MG per tablet Take 1 tablet by mouth 2 (two) times daily. While taking pain meds to prevent constipation, 30 tablet 1  . telmisartan (MICARDIS)  80 MG tablet Take 80 mg by mouth at bedtime.    . vitamin C (ASCORBIC ACID) 500 MG tablet Take 500 mg by mouth daily.     No current facility-administered medications for this visit.      Physical Exam  Blood pressure (!) 141/82, pulse 80, temperature 97.5 F (36.4 C), height 6\' 3"  (1.905 m), weight 213 lb (96.6 kg).  Constitutional: overall normal hygiene, normal nutrition, well developed, normal grooming, normal body habitus. Assistive device:none  Musculoskeletal: gait and station Limp none, muscle tone and strength are normal, no tremors or atrophy is present.  .   Neurological: coordination overall normal.  Deep tendon reflex/nerve stretch intact.  Sensation normal.  Cranial nerves II-XII intact.   Skin:   normal overall no scars, lesions, ulcers or rashes. No psoriasis.  Psychiatric: Alert and oriented x 3.  Recent memory intact, remote memory unclear.  Normal mood and affect. Well groomed.  Good eye contact.  Cardiovascular: overall no swelling, no varicosities, no edema bilaterally, normal temperatures of the legs and arms, no clubbing, cyanosis and good capillary refill.  Lymphatic: palpation is normal.  He has full motion of the shoulders but they are tender.  He has no swelling or redness.  He has full motion of the fingers and wrists but they are slightly tender he says.  He has no swelling or redness.  Motion of the knees is full.  Gait is normal.  The patient has been educated about the nature of the problem(s) and counseled on treatment options.  The patient appeared to understand what I have discussed and is in agreement with it.  Encounter Diagnosis  Name Primary?  Marland Kitchen Arthritis Yes    PLAN Call if any problems.  Precautions discussed.  Continue current medications.   Return to clinic to see rheumatologist.   Electronically Signed Sanjuana Kava, MD 8/24/20179:10 AM

## 2015-10-27 ENCOUNTER — Telehealth: Payer: Self-pay | Admitting: Orthopaedic Surgery

## 2015-10-27 NOTE — Telephone Encounter (Signed)
Patient has an appointment with the Rheumatologist on 12/17/15 @ 9am. He wanted to be sure you were informed.

## 2015-10-28 NOTE — Telephone Encounter (Signed)
See note

## 2015-11-17 ENCOUNTER — Telehealth: Payer: Self-pay | Admitting: Orthopaedic Surgery

## 2015-11-17 MED ORDER — PREDNISONE 5 MG (21) PO TBPK: ORAL_TABLET | ORAL | 0 refills | Status: DC

## 2015-11-17 NOTE — Telephone Encounter (Signed)
Prednisone  5mg   21 Tablets  Patient is asking if you can send this prescription into his Pharmacy

## 2016-03-22 ENCOUNTER — Other Ambulatory Visit: Payer: Self-pay | Admitting: Urology

## 2016-03-22 ENCOUNTER — Ambulatory Visit (HOSPITAL_COMMUNITY)
Admission: RE | Admit: 2016-03-22 | Discharge: 2016-03-22 | Disposition: A | Discharge status: Home / Self Care | Payer: Medicare Other | Source: Ambulatory Visit | Attending: Urology | Admitting: Urology

## 2016-03-22 DIAGNOSIS — C799 Secondary malignant neoplasm of unspecified site: Secondary | ICD-10-CM

## 2016-03-22 DIAGNOSIS — C679 Malignant neoplasm of bladder, unspecified: Secondary | ICD-10-CM | POA: Diagnosis not present

## 2016-03-22 DIAGNOSIS — Z8551 Personal history of malignant neoplasm of bladder: Secondary | ICD-10-CM | POA: Diagnosis not present

## 2016-03-22 DIAGNOSIS — C801 Malignant (primary) neoplasm, unspecified: Secondary | ICD-10-CM | POA: Diagnosis not present

## 2016-03-22 DIAGNOSIS — C675 Malignant neoplasm of bladder neck: Secondary | ICD-10-CM | POA: Diagnosis not present

## 2016-03-29 DIAGNOSIS — N281 Cyst of kidney, acquired: Secondary | ICD-10-CM | POA: Diagnosis not present

## 2016-03-29 DIAGNOSIS — Z8551 Personal history of malignant neoplasm of bladder: Secondary | ICD-10-CM | POA: Diagnosis not present

## 2016-10-19 DIAGNOSIS — R5383 Other fatigue: Secondary | ICD-10-CM | POA: Diagnosis not present

## 2016-10-19 DIAGNOSIS — K210 Gastro-esophageal reflux disease with esophagitis: Secondary | ICD-10-CM | POA: Diagnosis not present

## 2016-10-19 DIAGNOSIS — I10 Essential (primary) hypertension: Secondary | ICD-10-CM | POA: Diagnosis not present

## 2016-10-19 DIAGNOSIS — G4700 Insomnia, unspecified: Secondary | ICD-10-CM | POA: Diagnosis not present

## 2016-10-20 DIAGNOSIS — K210 Gastro-esophageal reflux disease with esophagitis: Secondary | ICD-10-CM | POA: Diagnosis not present

## 2016-10-20 DIAGNOSIS — G4700 Insomnia, unspecified: Secondary | ICD-10-CM | POA: Diagnosis not present

## 2016-10-20 DIAGNOSIS — R42 Dizziness and giddiness: Secondary | ICD-10-CM | POA: Diagnosis not present

## 2016-10-20 DIAGNOSIS — I10 Essential (primary) hypertension: Secondary | ICD-10-CM | POA: Diagnosis not present

## 2016-10-20 DIAGNOSIS — R5383 Other fatigue: Secondary | ICD-10-CM | POA: Diagnosis not present

## 2017-03-18 ENCOUNTER — Emergency Department (HOSPITAL_COMMUNITY): Payer: Medicare Other

## 2017-03-18 ENCOUNTER — Encounter (HOSPITAL_COMMUNITY): Payer: Self-pay | Admitting: Emergency Medicine

## 2017-03-18 ENCOUNTER — Emergency Department (HOSPITAL_COMMUNITY)
Admission: EM | Admit: 2017-03-18 | Discharge: 2017-03-18 | Disposition: A | Discharge status: Home / Self Care | Payer: Medicare Other | Attending: Emergency Medicine | Admitting: Emergency Medicine

## 2017-03-18 ENCOUNTER — Other Ambulatory Visit: Payer: Self-pay

## 2017-03-18 DIAGNOSIS — R42 Dizziness and giddiness: Secondary | ICD-10-CM | POA: Diagnosis not present

## 2017-03-18 DIAGNOSIS — I10 Essential (primary) hypertension: Secondary | ICD-10-CM | POA: Insufficient documentation

## 2017-03-18 DIAGNOSIS — R404 Transient alteration of awareness: Secondary | ICD-10-CM | POA: Diagnosis not present

## 2017-03-18 DIAGNOSIS — R1110 Vomiting, unspecified: Secondary | ICD-10-CM | POA: Diagnosis not present

## 2017-03-18 DIAGNOSIS — Z79899 Other long term (current) drug therapy: Secondary | ICD-10-CM | POA: Insufficient documentation

## 2017-03-18 DIAGNOSIS — R531 Weakness: Secondary | ICD-10-CM | POA: Diagnosis not present

## 2017-03-18 HISTORY — DX: Malignant (primary) neoplasm, unspecified: C80.1

## 2017-03-18 LAB — COMPREHENSIVE METABOLIC PANEL
ALT: 35 U/L (ref 17–63)
AST: 22 U/L (ref 15–41)
Albumin: 3.8 g/dL (ref 3.5–5.0)
Alkaline Phosphatase: 70 U/L (ref 38–126)
Anion gap: 16 — ABNORMAL HIGH (ref 5–15)
BUN: 14 mg/dL (ref 6–20)
CO2: 17 mmol/L — ABNORMAL LOW (ref 22–32)
Calcium: 9.1 mg/dL (ref 8.9–10.3)
Chloride: 101 mmol/L (ref 101–111)
Creatinine, Ser: 1.06 mg/dL (ref 0.61–1.24)
GFR calc Af Amer: >60 mL/min (ref >60)
GFR calc non Af Amer: >60 mL/min (ref >60)
Glucose, Bld: 165 mg/dL — ABNORMAL HIGH (ref 65–99)
Potassium: 3.9 mmol/L (ref 3.5–5.1)
Sodium: 134 mmol/L — ABNORMAL LOW (ref 135–145)
Total Bilirubin: 1.9 mg/dL — ABNORMAL HIGH (ref 0.3–1.2)
Total Protein: 7.5 g/dL (ref 6.5–8.1)

## 2017-03-18 LAB — CBC WITH DIFFERENTIAL/PLATELET
Basophils Absolute: 0 10*3/uL (ref 0.0–0.1)
Basophils Relative: 0 %
Eosinophils Absolute: 0 10*3/uL (ref 0.0–0.7)
Eosinophils Relative: 0 %
HCT: 42.2 % (ref 39.0–52.0)
Hemoglobin: 14.2 g/dL (ref 13.0–17.0)
Lymphocytes Relative: 9 %
Lymphs Abs: 1 10*3/uL (ref 0.7–4.0)
MCH: 31 pg (ref 26.0–34.0)
MCHC: 33.6 g/dL (ref 30.0–36.0)
MCV: 92.1 fL (ref 78.0–100.0)
Monocytes Absolute: 1.2 10*3/uL — ABNORMAL HIGH (ref 0.1–1.0)
Monocytes Relative: 12 %
Neutro Abs: 8.5 10*3/uL — ABNORMAL HIGH (ref 1.7–7.7)
Neutrophils Relative %: 79 %
Platelets: 202 10*3/uL (ref 150–400)
RBC: 4.58 MIL/uL (ref 4.22–5.81)
RDW: 12.8 % (ref 11.5–15.5)
WBC: 10.7 10*3/uL — ABNORMAL HIGH (ref 4.0–10.5)

## 2017-03-18 MED ORDER — ONDANSETRON HCL 4 MG/2ML IJ SOLN
4.0000 mg | Freq: Once | INTRAMUSCULAR | Status: AC
  Administered 2017-03-18: 4 mg via INTRAVENOUS
  Filled 2017-03-18: qty 2

## 2017-03-18 MED ORDER — MECLIZINE HCL 25 MG PO TABS: 25.0000 mg | ORAL_TABLET | Freq: Three times a day (TID) | ORAL | 0 refills | Status: DC | PRN

## 2017-03-18 MED ORDER — SODIUM CHLORIDE 0.9 % IV BOLUS (SEPSIS)
1000.0000 mL | Freq: Once | INTRAVENOUS | Status: AC
  Administered 2017-03-18: 1000 mL via INTRAVENOUS

## 2017-03-18 MED ORDER — ONDANSETRON 4 MG PO TBDP: ORAL_TABLET | ORAL | 0 refills | Status: DC

## 2017-03-18 NOTE — ED Notes (Signed)
Pt states that he feels better but is still nauseaed he rates it at 7 out of 10 dr notified medicated as ordered

## 2017-03-18 NOTE — Discharge Instructions (Signed)
Drink plenty of fluids.  Rest at home this weekend.  Follow-up with your doctor next week.  Return here if any problems

## 2017-03-18 NOTE — ED Triage Notes (Signed)
Pt reports dizziness with sitting up and severe nausea.  States he has been vomiting intermittently, but is mostly nauseated.

## 2017-03-18 NOTE — ED Provider Notes (Signed)
[facility] EMERGENCY DEPARTMENT Provider Note   CSN: [id number] Arrival date & time: 03/18/17  1606     History   Chief Complaint Chief Complaint  Patient presents with  . Dizziness  . Emesis    HPI Benjamin Yang is a 82 y.o. male.  Patient states he had some dizziness.   He is also had some vomiting.  Patient states he almost passed out.   The history is provided by the patient.  Dizziness  Quality:  Lightheadedness Severity:  Mild Onset quality:  Gradual Timing:  Intermittent Progression:  Resolved Chronicity:  Recurrent Associated symptoms: vomiting   Associated symptoms: no chest pain, no diarrhea and no headaches   Emesis   Pertinent negatives include no abdominal pain, no cough, no diarrhea and no headaches.    Past Medical History:  Diagnosis Date  . Cancer ([place])    bladder  . Hypertension   . Prostatitis     There are no active problems to display for this patient.   Past Surgical History:  Procedure Laterality Date  . BACK SURGERY    . CYSTOSCOPY N/A 09/15/2012   Procedure: CYSTOSCOPY;  Surgeon: Alexis Frock, MD;  Location: [facility];  Service: Urology;  Laterality: N/A;  . LYMPHADENECTOMY Bilateral 09/15/2012   Procedure:  Bilateral Pelvic Lymph Node Dissection;  Surgeon: Alexis Frock, MD;  Location: [facility];  Service: Urology;  Laterality: Bilateral;  . ROBOT ASSISTED LAPAROSCOPIC COMPLETE CYSTECT ILEAL CONDUIT N/A 09/15/2012   Procedure: Robotic Cystoprostatectomy, Open Ileal Conduit Diversion,  Indocyanine Green Dye Injection into Bladder ;  Surgeon: Alexis Frock, MD;  Location: [facility];  Service: Urology;  Laterality: N/A;  Robotic Cystoprostatectomy, Bilateral Pelvic Lymph Node Dissection, Open Ileal Conduit Diversion, Cysto, Indocyanine Green Dye Injection into Bladder    . ROBOTIC ASSISTED LAPAROSCOPIC LYSIS OF ADHESION N/A 09/15/2012   Procedure: ROBOTIC ASSISTED LAPAROSCOPIC LYSIS OF ADHESION;  Surgeon: Alexis Frock, MD;  Location: [facility];  Service: Urology;  Laterality: N/A;       Home Medications    Prior to Admission medications   Medication Sig Start Date End Date Taking? Authorizing Provider  LORazepam (ATIVAN) 1 MG tablet Take 1 tablet by mouth every 8 (eight) hours. 03/18/17  Yes [provider]  omeprazole (PRILOSEC) 20 MG capsule Take 1 capsule by mouth daily. 02/17/17  Yes [provider]  telmisartan (MICARDIS) 80 MG tablet Take 80 mg by mouth at bedtime.   Yes [provider]  meclizine (ANTIVERT) 25 MG tablet Take 1 tablet (25 mg total) by mouth 3 (three) times daily as needed for dizziness. 03/18/17   Milton Ferguson, MD  ondansetron (ZOFRAN ODT) 4 MG disintegrating tablet 4mg  ODT q4 hours prn nausea/vomit 03/18/17   Milton Ferguson, MD    Family History History reviewed. No pertinent family history.  Social History Social History   Tobacco Use  . Smoking status: Never Smoker  Substance Use Topics  . Alcohol use: Yes  . Drug use: No     Allergies   Percocet [oxycodone-acetaminophen]   Review of Systems Review of Systems  Constitutional: Negative for appetite change and fatigue.  HENT: Negative for congestion, ear discharge and sinus pressure.   Eyes: Negative for discharge.  Respiratory: Negative for cough.   Cardiovascular: Negative for chest pain.  Gastrointestinal: Positive for vomiting. Negative for abdominal pain and diarrhea.  Genitourinary: Negative for frequency and hematuria.  Musculoskeletal: Negative for back pain.  Skin: Negative for rash.  Neurological: Positive for dizziness.  Negative for seizures and headaches.  Psychiatric/Behavioral: Negative for hallucinations.     Physical Exam Updated Vital Signs BP 130/81 (BP Location: Left Arm)   Pulse 65   Temp 97.9 F (36.6 C) (Oral)   Resp 10   Ht 6\' 2"  (1.88 m)   Wt 93.9 kg (207 lb)   SpO2 100%   BMI 26.58 kg/m   Physical Exam  Constitutional: He is oriented to person, place, and time. He  appears well-developed.  HENT:  Head: Normocephalic.  Eyes: Conjunctivae and EOM are normal. No scleral icterus.  Neck: Neck supple. No tracheal deviation present. No thyromegaly present.  Cardiovascular: Normal rate and regular rhythm. Exam reveals no gallop and no friction rub.  No murmur heard. Pulmonary/Chest: No stridor. He has no wheezes. He has no rales. He exhibits no tenderness.  Abdominal: He exhibits no distension. There is no tenderness. There is no rebound.  Musculoskeletal: Normal range of motion. He exhibits no edema.  Lymphadenopathy:    He has no cervical adenopathy.  Neurological: He is oriented to person, place, and time. He exhibits normal muscle tone. Coordination normal.  Skin: Skin is warm. No rash noted. No erythema.  Psychiatric: He has a normal mood and affect. His behavior is normal.  Nursing note and vitals reviewed.    ED Treatments / Results  Labs (all labs ordered are listed, but only abnormal results are displayed) Labs Reviewed  CBC WITH DIFFERENTIAL/PLATELET - Abnormal; Notable for the following components:      Result Value   WBC 10.7 (*)    Neutro Abs 8.5 (*)    Monocytes Absolute 1.2 (*)    All other components within normal limits  COMPREHENSIVE METABOLIC PANEL - Abnormal; Notable for the following components:   Sodium 134 (*)    CO2 17 (*)    Glucose, Bld 165 (*)    Total Bilirubin 1.9 (*)    Anion gap 16 (*)    All other components within normal limits    EKG  EKG Interpretation None       Radiology Dg Abd Acute W/chest  Result Date: 03/18/2017 CLINICAL DATA:  Dizziness with severe nausea EXAM: DG ABDOMEN ACUTE W/ 1V CHEST COMPARISON:  03/22/2016, 03/21/2015 FINDINGS: Single-view chest demonstrates mild right pleural thickening. No consolidation or effusion. Stable cardiomediastinal silhouette with aortic atherosclerosis. Supine and upright views of the abdomen demonstrate no free air beneath the diaphragm. Nonobstructed  bowel-gas pattern with moderate stool in the colon. Surgical sutures in the pelvis. IMPRESSION: 1. Mild right pleural effusion or thickening 2. Nonobstructed gas pattern Electronically Signed   By: Donavan Foil M.D.   On: 03/18/2017 18:42    Procedures Procedures (including critical care time)  Medications Ordered in ED Medications  sodium chloride 0.9 % bolus 1,000 mL (0 mLs Intravenous Stopped 03/18/17 1921)  ondansetron (ZOFRAN) injection 4 mg (4 mg Intravenous Given 03/18/17 1701)  ondansetron (ZOFRAN) injection 4 mg (4 mg Intravenous Given 03/18/17 1729)     Initial Impression / Assessment and Plan / ED Course  I have reviewed the triage vital signs and the nursing notes.  Pertinent labs & imaging results that were available during my care of the patient were reviewed by me and considered in my medical decision making (see chart for details).     Patient improved with IV fluids and nausea medicine.  Labs unremarkable.  Suspect viral syndrome possible inner ear problem.  Patient will follow up with his PCP and is given Zofran and  Antivert to use if needed  Final Clinical Impressions(s) / ED Diagnoses   Final diagnoses:  Dizziness    ED Discharge Orders        Ordered    ondansetron (ZOFRAN ODT) 4 MG disintegrating tablet     03/18/17 2159    meclizine (ANTIVERT) 25 MG tablet  3 times daily PRN     03/18/17 2159       Milton Ferguson, MD 03/18/17 2207

## 2017-03-23 DIAGNOSIS — I10 Essential (primary) hypertension: Secondary | ICD-10-CM | POA: Diagnosis not present

## 2017-03-23 DIAGNOSIS — K210 Gastro-esophageal reflux disease with esophagitis: Secondary | ICD-10-CM | POA: Diagnosis not present

## 2017-03-23 DIAGNOSIS — C679 Malignant neoplasm of bladder, unspecified: Secondary | ICD-10-CM | POA: Diagnosis not present

## 2017-03-23 DIAGNOSIS — R110 Nausea: Secondary | ICD-10-CM | POA: Diagnosis not present

## 2017-04-12 DIAGNOSIS — Z8551 Personal history of malignant neoplasm of bladder: Secondary | ICD-10-CM | POA: Diagnosis not present

## 2017-04-12 DIAGNOSIS — C679 Malignant neoplasm of bladder, unspecified: Secondary | ICD-10-CM | POA: Diagnosis not present

## 2017-04-12 DIAGNOSIS — N281 Cyst of kidney, acquired: Secondary | ICD-10-CM | POA: Diagnosis not present

## 2017-04-14 DIAGNOSIS — C678 Malignant neoplasm of overlapping sites of bladder: Secondary | ICD-10-CM | POA: Diagnosis not present

## 2017-04-14 DIAGNOSIS — N281 Cyst of kidney, acquired: Secondary | ICD-10-CM | POA: Diagnosis not present

## 2017-04-14 DIAGNOSIS — C799 Secondary malignant neoplasm of unspecified site: Secondary | ICD-10-CM | POA: Diagnosis not present

## 2017-04-14 DIAGNOSIS — Z936 Other artificial openings of urinary tract status: Secondary | ICD-10-CM | POA: Diagnosis not present

## 2018-04-13 ENCOUNTER — Encounter (HOSPITAL_COMMUNITY): Payer: Self-pay

## 2018-04-13 ENCOUNTER — Emergency Department (HOSPITAL_COMMUNITY): Payer: Medicare Other

## 2018-04-13 ENCOUNTER — Inpatient Hospital Stay (HOSPITAL_COMMUNITY): Payer: Medicare Other

## 2018-04-13 ENCOUNTER — Other Ambulatory Visit: Payer: Self-pay

## 2018-04-13 ENCOUNTER — Inpatient Hospital Stay (HOSPITAL_COMMUNITY)
Admission: EM | Admit: 2018-04-13 | Discharge: 2018-04-16 | DRG: 871 | Disposition: A | Discharge status: Home / Self Care | Payer: Medicare Other | Attending: Pulmonary Disease | Admitting: Pulmonary Disease

## 2018-04-13 DIAGNOSIS — R110 Nausea: Secondary | ICD-10-CM | POA: Diagnosis not present

## 2018-04-13 DIAGNOSIS — Z79899 Other long term (current) drug therapy: Secondary | ICD-10-CM

## 2018-04-13 DIAGNOSIS — E860 Dehydration: Secondary | ICD-10-CM | POA: Diagnosis present

## 2018-04-13 DIAGNOSIS — A419 Sepsis, unspecified organism: Secondary | ICD-10-CM | POA: Diagnosis not present

## 2018-04-13 DIAGNOSIS — J189 Pneumonia, unspecified organism: Secondary | ICD-10-CM

## 2018-04-13 DIAGNOSIS — E1165 Type 2 diabetes mellitus with hyperglycemia: Secondary | ICD-10-CM | POA: Diagnosis not present

## 2018-04-13 DIAGNOSIS — I10 Essential (primary) hypertension: Secondary | ICD-10-CM | POA: Diagnosis present

## 2018-04-13 DIAGNOSIS — R509 Fever, unspecified: Secondary | ICD-10-CM | POA: Diagnosis not present

## 2018-04-13 DIAGNOSIS — Z936 Other artificial openings of urinary tract status: Secondary | ICD-10-CM | POA: Diagnosis not present

## 2018-04-13 DIAGNOSIS — F419 Anxiety disorder, unspecified: Secondary | ICD-10-CM | POA: Diagnosis present

## 2018-04-13 DIAGNOSIS — E871 Hypo-osmolality and hyponatremia: Secondary | ICD-10-CM | POA: Diagnosis present

## 2018-04-13 DIAGNOSIS — J181 Lobar pneumonia, unspecified organism: Secondary | ICD-10-CM | POA: Diagnosis present

## 2018-04-13 DIAGNOSIS — N390 Urinary tract infection, site not specified: Secondary | ICD-10-CM | POA: Diagnosis present

## 2018-04-13 DIAGNOSIS — R531 Weakness: Secondary | ICD-10-CM | POA: Diagnosis not present

## 2018-04-13 DIAGNOSIS — Z8551 Personal history of malignant neoplasm of bladder: Secondary | ICD-10-CM | POA: Diagnosis not present

## 2018-04-13 DIAGNOSIS — R000 Tachycardia, unspecified: Secondary | ICD-10-CM | POA: Diagnosis not present

## 2018-04-13 LAB — CBC WITH DIFFERENTIAL/PLATELET
Abs Immature Granulocytes: 0.04 10*3/uL (ref 0.00–0.07)
Basophils Absolute: 0 10*3/uL (ref 0.0–0.1)
Basophils Relative: 0 %
Eosinophils Absolute: 0.1 10*3/uL (ref 0.0–0.5)
Eosinophils Relative: 1 %
HCT: 40.9 % (ref 39.0–52.0)
Hemoglobin: 13.1 g/dL (ref 13.0–17.0)
Immature Granulocytes: 0 %
Lymphocytes Relative: 3 %
Lymphs Abs: 0.4 10*3/uL — ABNORMAL LOW (ref 0.7–4.0)
MCH: 30.3 pg (ref 26.0–34.0)
MCHC: 32 g/dL (ref 30.0–36.0)
MCV: 94.7 fL (ref 80.0–100.0)
Monocytes Absolute: 0.7 10*3/uL (ref 0.1–1.0)
Monocytes Relative: 5 %
Neutro Abs: 11.1 10*3/uL — ABNORMAL HIGH (ref 1.7–7.7)
Neutrophils Relative %: 91 %
Platelets: 274 10*3/uL (ref 150–400)
RBC: 4.32 MIL/uL (ref 4.22–5.81)
RDW: 12.1 % (ref 11.5–15.5)
WBC: 12.2 10*3/uL — ABNORMAL HIGH (ref 4.0–10.5)
nRBC: 0 % (ref 0.0–0.2)

## 2018-04-13 LAB — COMPREHENSIVE METABOLIC PANEL
ALBUMIN: 3.8 g/dL (ref 3.5–5.0)
ALT: 60 U/L — ABNORMAL HIGH (ref 0–44)
AST: 38 U/L (ref 15–41)
Alkaline Phosphatase: 77 U/L (ref 38–126)
Anion gap: 11 (ref 5–15)
BILIRUBIN TOTAL: 0.7 mg/dL (ref 0.3–1.2)
BUN: 17 mg/dL (ref 8–23)
CO2: 17 mmol/L — ABNORMAL LOW (ref 22–32)
Calcium: 8.2 mg/dL — ABNORMAL LOW (ref 8.9–10.3)
Chloride: 103 mmol/L (ref 98–111)
Creatinine, Ser: 1.22 mg/dL (ref 0.61–1.24)
GFR calc Af Amer: >60 mL/min (ref >60)
GFR calc non Af Amer: 56 mL/min — ABNORMAL LOW (ref >60)
GLUCOSE: 289 mg/dL — AB (ref 70–99)
Potassium: 3.9 mmol/L (ref 3.5–5.1)
Sodium: 131 mmol/L — ABNORMAL LOW (ref 135–145)
TOTAL PROTEIN: 7.1 g/dL (ref 6.5–8.1)

## 2018-04-13 LAB — URINALYSIS, ROUTINE W REFLEX MICROSCOPIC
Bilirubin Urine: NEGATIVE
Glucose, UA: 150 mg/dL — AB
Ketones, ur: NEGATIVE mg/dL
Nitrite: POSITIVE — AB
Protein, ur: 100 mg/dL — AB
Specific Gravity, Urine: 1.012 (ref 1.005–1.030)
pH: 7 (ref 5.0–8.0)

## 2018-04-13 LAB — LACTIC ACID, PLASMA
LACTIC ACID, VENOUS: 2.3 mmol/L — AB (ref 0.5–1.9)
Lactic Acid, Venous: 3 mmol/L (ref 0.5–1.9)
Lactic Acid, Venous: 2.8 mmol/L (ref 0.5–1.9)

## 2018-04-13 LAB — APTT: aPTT: 32 seconds (ref 24–36)

## 2018-04-13 LAB — PROTIME-INR
INR: 1.02
Prothrombin Time: 13.3 seconds (ref 11.4–15.2)

## 2018-04-13 LAB — INFLUENZA PANEL BY PCR (TYPE A & B)
Influenza A By PCR: NEGATIVE
Influenza B By PCR: NEGATIVE

## 2018-04-13 LAB — PROCALCITONIN: Procalcitonin: 0.13 ng/mL

## 2018-04-13 MED ORDER — ACETAMINOPHEN 325 MG PO TABS
650.0000 mg | ORAL_TABLET | Freq: Once | ORAL | Status: AC
  Administered 2018-04-13: 650 mg via ORAL
  Filled 2018-04-13: qty 2

## 2018-04-13 MED ORDER — SODIUM CHLORIDE 0.9 % IV SOLN
2.0000 g | INTRAVENOUS | Status: DC
  Administered 2018-04-14 – 2018-04-15 (×2): 2 g via INTRAVENOUS
  Filled 2018-04-13 (×2): qty 2
  Filled 2018-04-13 (×2): qty 20

## 2018-04-13 MED ORDER — SODIUM CHLORIDE 0.9 % IV BOLUS
2000.0000 mL | Freq: Once | INTRAVENOUS | Status: AC
  Administered 2018-04-13: 1000 mL via INTRAVENOUS

## 2018-04-13 MED ORDER — ACETAMINOPHEN 650 MG RE SUPP: 650.0000 mg | Freq: Four times a day (QID) | RECTAL | Status: DC | PRN

## 2018-04-13 MED ORDER — BENZONATATE 100 MG PO CAPS
200.0000 mg | ORAL_CAPSULE | Freq: Once | ORAL | Status: AC
  Administered 2018-04-13: 200 mg via ORAL
  Filled 2018-04-13: qty 2

## 2018-04-13 MED ORDER — ONDANSETRON HCL 4 MG/2ML IJ SOLN
4.0000 mg | Freq: Once | INTRAMUSCULAR | Status: AC
  Administered 2018-04-13: 4 mg via INTRAVENOUS
  Filled 2018-04-13: qty 2

## 2018-04-13 MED ORDER — ONDANSETRON HCL 4 MG PO TABS: 4.0000 mg | ORAL_TABLET | Freq: Four times a day (QID) | ORAL | Status: DC | PRN

## 2018-04-13 MED ORDER — SODIUM CHLORIDE 0.9 % IV SOLN
INTRAVENOUS | Status: DC
  Administered 2018-04-13 – 2018-04-16 (×8): via INTRAVENOUS

## 2018-04-13 MED ORDER — PANTOPRAZOLE SODIUM 40 MG PO TBEC
40.0000 mg | DELAYED_RELEASE_TABLET | Freq: Every day | ORAL | Status: DC
  Administered 2018-04-13 – 2018-04-16 (×4): 40 mg via ORAL
  Filled 2018-04-13 (×4): qty 1

## 2018-04-13 MED ORDER — IRBESARTAN 300 MG PO TABS
300.0000 mg | ORAL_TABLET | Freq: Every day | ORAL | Status: DC
  Administered 2018-04-14 – 2018-04-16 (×3): 300 mg via ORAL
  Filled 2018-04-13 (×6): qty 1

## 2018-04-13 MED ORDER — ENOXAPARIN SODIUM 40 MG/0.4ML ~~LOC~~ SOLN
40.0000 mg | SUBCUTANEOUS | Status: DC
  Administered 2018-04-13 – 2018-04-15 (×3): 40 mg via SUBCUTANEOUS
  Filled 2018-04-13 (×3): qty 0.4

## 2018-04-13 MED ORDER — ACETAMINOPHEN 325 MG PO TABS: 650.0000 mg | ORAL_TABLET | Freq: Four times a day (QID) | ORAL | Status: DC | PRN

## 2018-04-13 MED ORDER — LEVOFLOXACIN IN D5W 500 MG/100ML IV SOLN
500.0000 mg | Freq: Once | INTRAVENOUS | Status: AC
  Administered 2018-04-13: 500 mg via INTRAVENOUS
  Filled 2018-04-13: qty 100

## 2018-04-13 MED ORDER — LORAZEPAM 1 MG PO TABS
1.0000 mg | ORAL_TABLET | Freq: Three times a day (TID) | ORAL | Status: DC
  Administered 2018-04-13 – 2018-04-16 (×8): 1 mg via ORAL
  Filled 2018-04-13 (×8): qty 1

## 2018-04-13 MED ORDER — AZITHROMYCIN 250 MG PO TABS: 500.0000 mg | ORAL_TABLET | Freq: Every day | ORAL | Status: DC

## 2018-04-13 MED ORDER — ONDANSETRON HCL 4 MG/2ML IJ SOLN: 4.0000 mg | Freq: Four times a day (QID) | INTRAMUSCULAR | Status: DC | PRN

## 2018-04-13 NOTE — ED Notes (Signed)
Hospitalist at Pts bedside 

## 2018-04-13 NOTE — H&P (Signed)
TRH H&P   Patient Demographics:    Benjamin Yang, is a 83 y.o. male  MRN: [id number]   DOB - Jul 12, 1935  Admit Date - 04/13/2018  Outpatient Primary MD for the patient is Sinda Du, MD  Referring MD: Dr. Roderic Palau  Outpatient Specialists: Dr. Tresa Moore (urology)  Patient coming from: Home  Chief Complaint  Patient presents with  . Fatigue      HPI:    Benjamin Yang  is a 83 y.o. male, with history of bladder cancer status post complete cystectomy with ileal conduit, hypertension and anxiety who was brought to the ED by EMS with fever of 103 F at home, nausea, cough with congestion.  Patient was in his usual state of health about 4 days back when he started having URI symptoms with nasal congestion, dry cough, throat and chest congestion.  He also started feeling short of breath.  He reported poor appetite and generalized weakness.  He informs that his wife had URI symptoms few days back.  For past 2 days he has been increasingly nauseous as well but no vomiting.  Today he had fever of 103 F with shortness of breath on minimal exertion, increasing nonproductive cough and generalized weakness. He denies any headache, blurred vision, dizziness, chest pain, palpitations, orthopnea, PND, abdominal pain, dysuria, flank pain.  Does report some loose stools but no diarrhea.  Denies any tingling or numbness of his extremities.  Denies any hematuria. . Course in the ED Patient was septic with fever of 102.2 F, tachypneic, tachycardic to 110s, normal blood pressure and O2 sat.  Blood work showed WBC of 12.2K, sodium of 131, CO2 of 17 glucose of 289 and lactic acid of 3.  Chest x-ray negative for infiltrate.  UA however was suggestive of UTI. Sepsis pathway initiated in the ED and patient given 2 L normal saline bolus, IV Zofran, Tessalon and Tylenol.  Blood cultures and urine culture sent  and given empiric IV Levaquin. During my exam patient reported starting to feel better. Hospitalist consulted for admission to [facility] for sepsis secondary to possible pneumonia and UTI.  Review of systems:    In addition to the HPI above,  Fevers and chills+++ No Headache, No changes with Vision or hearing, No problems swallowing food or Liquids, No Chest pain, Cough and shortness of Breath+++, No Abdominal pain, nausea+++, no vomiting, Bowel movements are regular, No Blood in stool or Urine, No dysuria, No new skin rashes or bruises, No new joints pains-aches,  Generalized weakness, no tingling, numbness in any extremity, No recent weight gain or loss, No polyuria, polydypsia or polyphagia, No significant Mental Stressors.     With Past History of the following :    Past Medical History:  Diagnosis Date  . Cancer ([place])    bladder  . Hypertension   . Prostatitis       Past  Surgical History:  Procedure Laterality Date  . BACK SURGERY    . CYSTOSCOPY N/A 09/15/2012   Procedure: CYSTOSCOPY;  Surgeon: Alexis Frock, MD;  Location: [facility];  Service: Urology;  Laterality: N/A;  . LYMPHADENECTOMY Bilateral 09/15/2012   Procedure:  Bilateral Pelvic Lymph Node Dissection;  Surgeon: Alexis Frock, MD;  Location: [facility];  Service: Urology;  Laterality: Bilateral;  . ROBOT ASSISTED LAPAROSCOPIC COMPLETE CYSTECT ILEAL CONDUIT N/A 09/15/2012   Procedure: Robotic Cystoprostatectomy, Open Ileal Conduit Diversion,  Indocyanine Green Dye Injection into Bladder ;  Surgeon: Alexis Frock, MD;  Location: [facility];  Service: Urology;  Laterality: N/A;  Robotic Cystoprostatectomy, Bilateral Pelvic Lymph Node Dissection, Open Ileal Conduit Diversion, Cysto, Indocyanine Green Dye Injection into Bladder    . ROBOTIC ASSISTED LAPAROSCOPIC LYSIS OF ADHESION N/A 09/15/2012   Procedure: ROBOTIC ASSISTED LAPAROSCOPIC LYSIS OF ADHESION;  Surgeon: Alexis Frock, MD;  Location: [facility];  Service: Urology;   Laterality: N/A;      Social History:     Social History   Tobacco Use  . Smoking status: Never Smoker  . Smokeless tobacco: Never Used  Substance Use Topics  . Alcohol use: Yes     Lives -home with wife  Mobility -independent    Family History :   No family history of heart disease, stroke or diabetes mellitus   Home Medications:   Prior to Admission medications   Medication Sig Start Date End Date Taking? Authorizing Provider  LORazepam (ATIVAN) 1 MG tablet Take 1 tablet by mouth every 8 (eight) hours. 03/18/17  Yes [provider]  omeprazole (PRILOSEC) 20 MG capsule Take 1 capsule by mouth daily. 02/17/17  Yes [provider]  telmisartan (MICARDIS) 80 MG tablet Take 80 mg by mouth at bedtime.   Yes [provider]  meclizine (ANTIVERT) 25 MG tablet Take 1 tablet (25 mg total) by mouth 3 (three) times daily as needed for dizziness. 03/18/17   Milton Ferguson, MD  ondansetron (ZOFRAN ODT) 4 MG disintegrating tablet 4mg  ODT q4 hours prn nausea/vomit 03/18/17   Milton Ferguson, MD     Allergies:     Allergies  Allergen Reactions  . Percocet [Oxycodone-Acetaminophen]     Patient says he can take this for pain-just bothered his stomach a little bit before     Physical Exam:   Vitals  Blood pressure 128/87, pulse 92, temperature 98.8 F (37.1 C), temperature source Oral, resp. rate (!) 22, height 6\' 2"  (1.88 m), weight 77.1 kg, SpO2 94 %.  General: Elderly male lying in bed, fatigued, not in acute distress HEENT: Pupils reactive bilaterally, EOMI, no pallor, no icterus, dry oral mucosa, supple neck, no cervical lymphadenopathy Chest: Coarse bilateral crackles (L >R), no rhonchi or wheeze CVS: Normal S1-S2, no murmurs rub or gallop GI: Soft, nondistended, nontender, bowel sounds present, urostomy bag draining clear urine, no CVA tenderness Musculoskeletal: Warm, no edema CNS: Alert and oriented, nonfocal   Data Review:    CBC Recent  Labs  Lab 04/13/18 1847  WBC 12.2*  HGB 13.1  HCT 40.9  PLT 274  MCV 94.7  MCH 30.3  MCHC 32.0  RDW 12.1  LYMPHSABS 0.4*  MONOABS 0.7  EOSABS 0.1  BASOSABS 0.0   ------------------------------------------------------------------------------------------------------------------  Chemistries  Recent Labs  Lab 04/13/18 1847  NA 131*  K 3.9  CL 103  CO2 17*  GLUCOSE 289*  BUN 17  CREATININE 1.22  CALCIUM 8.2*  AST 38  ALT 60*  ALKPHOS 77  BILITOT 0.7   ------------------------------------------------------------------------------------------------------------------ estimated creatinine clearance is 52.7 mL/min (by C-G formula based on SCr of 1.22 mg/dL). ------------------------------------------------------------------------------------------------------------------ No results for input(s): TSH, T4TOTAL, T3FREE, THYROIDAB in the last 72 hours.  Invalid input(s): FREET3  Coagulation profile No results for input(s): INR, PROTIME in the last 168 hours. ------------------------------------------------------------------------------------------------------------------- No results for input(s): DDIMER in the last 72 hours. -------------------------------------------------------------------------------------------------------------------  Cardiac Enzymes No results for input(s): CKMB, TROPONINI, MYOGLOBIN in the last 168 hours.  Invalid input(s): CK ------------------------------------------------------------------------------------------------------------------ No results found for: BNP   ---------------------------------------------------------------------------------------------------------------  Urinalysis    Component Value Date/Time   COLORURINE YELLOW 04/13/2018 1827   APPEARANCEUR CLOUDY (A) 04/13/2018 1827   LABSPEC 1.012 04/13/2018 1827   PHURINE 7.0 04/13/2018 1827   GLUCOSEU 150 (A) 04/13/2018 1827   HGBUR SMALL (A) 04/13/2018 1827   BILIRUBINUR  NEGATIVE 04/13/2018 1827   [place] 04/13/2018 1827   PROTEINUR 100 (A) 04/13/2018 1827   UROBILINOGEN 0.2 04/21/2012 0200   NITRITE POSITIVE (A) 04/13/2018 1827   LEUKOCYTESUR MODERATE (A) 04/13/2018 1827    ----------------------------------------------------------------------------------------------------------------   Imaging Results:    Dg Chest Portable 1 View  Result Date: 04/13/2018 CLINICAL DATA:  Fever EXAM: PORTABLE CHEST 1 VIEW COMPARISON:  03/18/2017 FINDINGS: The heart size and mediastinal contours are within normal limits. Both lungs are clear. The visualized skeletal structures are unremarkable. IMPRESSION: No active disease. Electronically Signed   By: Rolm Baptise M.D.   On: 04/13/2018 19:12    My personal review of EKG: Sinus tachycardia at 109, T wave flattening in lateral lead  Assessment & Plan:    Principal Problem:   Sepsis secondary to lobar pneumonia ([place]) Sepsis pathway initiated in the ED.  Admit to MedSurg.  Received 2 L normal saline bolus and IV Levaquin in the ED.  We will place on empiric IV Rocephin and azithromycin to cover for both pneumonia and possible UTI. Follow repeat lactic acid and check procalcitonin.  Placed on maintenance normal saline at 125 cc/h. Follow blood culture, check urine for strep and Legionella antigen. Chest x-ray on admission without any acute infiltrate, repeat chest x-ray tomorrow after adequate IV fluids.  Supportive care with IV fluids and antiemetics.  Active Problems: ?  Urinary tract infection Patient has a urostomy with clear urine, no dysuria or CVA tenderness.  UA is suggestive of UTI.  Follow urine culture.  Continue empiric antibiotic for now    Essential hypertension Continue ARB.  Anxiety Resume Ativan.  Hyperglycemia CBG in high 200s.  Possibly associated with acute sepsis.  No history of diabetes.  Monitor closely.  Hyponatremia Secondary to dehydration.  Monitor with fluids.  DVT  Prophylaxis: Subcu Lovenox  AM Labs Ordered, also please review Full Orders  Family Communication: Admission, patients condition and plan of care including tests being ordered have been discussed with the patient and his wife at bedside  Code Status full code  Likely DC to home in 40-72 hours and sepsis resolved and clinically improved  Condition: Fair  Consults called: None  Admission status: Inpatient Patient presenting with sepsis secondary to lobar pneumonia and possible UTI.  He will need aggressive IV hydration, close monitoring in the hospital for resolution of his sepsis along with empiric antibiotics and follow-up on culture.  He needs to be monitored in an inpatient setting for >2 midnight.   Time spent in minutes : 60   Makeba Delcastillo M.D on 04/13/2018 at 9:41 PM  Between 7am to 7pm - Pager - [phone]. After 7pm  go to [email or web address] - password [email or web address]  [facility] - Office  [phone]

## 2018-04-13 NOTE — ED Triage Notes (Signed)
Last 4 days pt has had a cold with a cough and congestion. Fever started today up to 103. Wife gave Aspirin and feels like he does not have a fever. CBG 304. Has been staying hydrated. NAD. Not diagnosed with the flu.   BP 190/104. Took normal dose of BP meds today.  Nauseous.

## 2018-04-13 NOTE — ED Notes (Signed)
Date and time results received: 04/13/18 2047  Test: Lactic Acid Critical Value: 2.8  Name of Provider Notified: Roderic Palau, MD

## 2018-04-13 NOTE — ED Notes (Signed)
ED Provider at bedside. 

## 2018-04-13 NOTE — ED Notes (Signed)
Date and time results received: 04/13/18 2011 (use smartphrase ".now" to insert current time)  Test: Lactic Acid Critical Value: 3.0  Name of Provider Notified: Roderic Palau, MD

## 2018-04-14 ENCOUNTER — Inpatient Hospital Stay (HOSPITAL_COMMUNITY): Payer: Medicare Other

## 2018-04-14 ENCOUNTER — Encounter (HOSPITAL_COMMUNITY): Payer: Self-pay

## 2018-04-14 LAB — BASIC METABOLIC PANEL
Anion gap: 7 (ref 5–15)
BUN: 17 mg/dL (ref 8–23)
CALCIUM: 7.6 mg/dL — AB (ref 8.9–10.3)
CO2: 20 mmol/L — ABNORMAL LOW (ref 22–32)
CREATININE: 1.31 mg/dL — AB (ref 0.61–1.24)
Chloride: 106 mmol/L (ref 98–111)
GFR calc Af Amer: 59 mL/min — ABNORMAL LOW (ref >60)
GFR calc non Af Amer: 51 mL/min — ABNORMAL LOW (ref >60)
Glucose, Bld: 221 mg/dL — ABNORMAL HIGH (ref 70–99)
Potassium: 4.5 mmol/L (ref 3.5–5.1)
Sodium: 133 mmol/L — ABNORMAL LOW (ref 135–145)

## 2018-04-14 LAB — CBC
HCT: 36.4 % — ABNORMAL LOW (ref 39.0–52.0)
Hemoglobin: 11.6 g/dL — ABNORMAL LOW (ref 13.0–17.0)
MCH: 30.6 pg (ref 26.0–34.0)
MCHC: 31.9 g/dL (ref 30.0–36.0)
MCV: 96 fL (ref 80.0–100.0)
PLATELETS: 239 10*3/uL (ref 150–400)
RBC: 3.79 MIL/uL — ABNORMAL LOW (ref 4.22–5.81)
RDW: 12.2 % (ref 11.5–15.5)
WBC: 21.5 10*3/uL — ABNORMAL HIGH (ref 4.0–10.5)
nRBC: 0 % (ref 0.0–0.2)

## 2018-04-14 LAB — LACTIC ACID, PLASMA: Lactic Acid, Venous: 1.9 mmol/L (ref 0.5–1.9)

## 2018-04-14 MED ORDER — GUAIFENESIN ER 600 MG PO TB12
1200.0000 mg | ORAL_TABLET | Freq: Two times a day (BID) | ORAL | Status: DC
  Administered 2018-04-14 – 2018-04-16 (×5): 1200 mg via ORAL
  Filled 2018-04-14 (×5): qty 2

## 2018-04-14 MED ORDER — GUAIFENESIN-DM 100-10 MG/5ML PO SYRP
5.0000 mL | ORAL_SOLUTION | ORAL | Status: DC | PRN
  Administered 2018-04-14 – 2018-04-16 (×3): 5 mL via ORAL
  Filled 2018-04-14 (×3): qty 5

## 2018-04-14 MED ORDER — AZITHROMYCIN 250 MG PO TABS
500.0000 mg | ORAL_TABLET | Freq: Every day | ORAL | Status: DC
  Administered 2018-04-14 – 2018-04-15 (×2): 500 mg via ORAL
  Filled 2018-04-14 (×2): qty 2

## 2018-04-14 MED ORDER — IPRATROPIUM-ALBUTEROL 0.5-2.5 (3) MG/3ML IN SOLN
3.0000 mL | RESPIRATORY_TRACT | Status: DC
  Administered 2018-04-14 – 2018-04-16 (×8): 3 mL via RESPIRATORY_TRACT
  Filled 2018-04-14 (×9): qty 3

## 2018-04-14 NOTE — Progress Notes (Signed)
Subjective: He was admitted yesterday with pneumonia/UTI and sepsis.  He seems to have resolved the sepsis pathophysiology and his lactic acid level is better.  I personally reviewed his chest x-ray which has been read as atelectasis but considering his fever cough congestion I think this is pneumonia.  He is being appropriately treated.  He says he feels better.  He is still weak still coughing still congested. Objective: Vital signs in last 24 hours: Temp:  [98.8 F (37.1 C)-102.2 F (39 C)] 98.8 F (37.1 C) (02/13 1949) Pulse Rate:  [71-115] 81 (02/14 0856) Resp:  [12-26] 18 (02/14 0856) BP: (91-149)/(52-87) 93/58 (02/14 0856) SpO2:  [89 %-96 %] 96 % (02/14 0856) Weight:  [77.1 kg] 77.1 kg (02/13 1820) Weight change:     Intake/Output from previous day: 02/13 0701 - 02/14 0700 In: 2100 [IV Piggyback:2100] Out: -   PHYSICAL EXAM General appearance: alert, cooperative and mild distress Resp: rhonchi bilaterally Cardio: regular rate and rhythm, S1, S2 normal, no murmur, click, rub or gallop GI: soft, non-tender; bowel sounds normal; no masses,  no organomegaly Extremities: extremities normal, atraumatic, no cyanosis or edema  Lab Results:  Results for orders placed or performed during the hospital encounter of 04/13/18 (from the past 48 hour(s))  Urinalysis, Routine w reflex microscopic     Status: Abnormal   Collection Time: 04/13/18  6:27 PM  Result Value Ref Range   Color, Urine YELLOW YELLOW   APPearance CLOUDY (A) CLEAR   Specific Gravity, Urine 1.012 1.005 - 1.030   pH 7.0 5.0 - 8.0   Glucose, UA 150 (A) NEGATIVE mg/dL   Hgb urine dipstick SMALL (A) NEGATIVE   Bilirubin Urine NEGATIVE NEGATIVE   Ketones, ur NEGATIVE NEGATIVE mg/dL   Protein, ur 100 (A) NEGATIVE mg/dL   Nitrite POSITIVE (A) NEGATIVE   Leukocytes,Ua MODERATE (A) NEGATIVE   RBC / HPF 6-10 0 - 5 RBC/hpf   WBC, UA 21-50 0 - 5 WBC/hpf   Bacteria, UA RARE (A) NONE SEEN   Squamous Epithelial / LPF 0-5 0  - 5   Mucus PRESENT     Comment: Performed at [facility], [address] [place], [place] [address]  Influenza panel by PCR (type A & B)     Status: None   Collection Time: 04/13/18  6:28 PM  Result Value Ref Range   Influenza A By PCR NEGATIVE NEGATIVE   Influenza B By PCR NEGATIVE NEGATIVE    Comment: (NOTE) The Xpert Xpress Flu assay is intended as an aid in the diagnosis of  influenza and should not be used as a sole basis for treatment.  This  assay is FDA approved for nasopharyngeal swab specimens only. Nasal  washings and aspirates are unacceptable for Xpert Xpress Flu testing. Performed at [facility], [address] [place], [place] [address]   Lactic acid, plasma     Status: Abnormal   Collection Time: 04/13/18  6:47 PM  Result Value Ref Range   Lactic Acid, Venous 3.0 (HH) 0.5 - 1.9 mmol/L    Comment: CRITICAL RESULT CALLED TO, READ BACK BY AND VERIFIED WITH: SAPPELT,J ON 04/13/18 AT 2010 BY LOY,C Performed at [facility], [address], [place], [place] [address]   Comprehensive metabolic panel     Status: Abnormal   Collection Time: 04/13/18  6:47 PM  Result Value Ref Range   Sodium 131 (L) 135 - 145 mmol/L   Potassium 3.9 3.5 - 5.1 mmol/L   Chloride 103 98 -  111 mmol/L   CO2 17 (L) 22 - 32 mmol/L   Glucose, Bld 289 (H) 70 - 99 mg/dL   BUN 17 8 - 23 mg/dL   Creatinine, Ser 1.22 0.61 - 1.24 mg/dL   Calcium 8.2 (L) 8.9 - 10.3 mg/dL   Total Protein 7.1 6.5 - 8.1 g/dL   Albumin 3.8 3.5 - 5.0 g/dL   AST 38 15 - 41 U/L   ALT 60 (H) 0 - 44 U/L   Alkaline Phosphatase 77 38 - 126 U/L   Total Bilirubin 0.7 0.3 - 1.2 mg/dL   GFR calc non Af Amer 56 (L) >60 mL/min   GFR calc Af Amer >60 >60 mL/min   Anion gap 11 5 - 15    Comment: Performed at [facility], [address], [place], [place] [address]  CBC WITH DIFFERENTIAL     Status: Abnormal   Collection Time: 04/13/18  6:47 PM  Result Value Ref Range   WBC 12.2 (H) 4.0 - 10.5 K/uL    Comment: WHITE COUNT CONFIRMED  ON SMEAR   RBC 4.32 4.22 - 5.81 MIL/uL   Hemoglobin 13.1 13.0 - 17.0 g/dL   HCT 40.9 39.0 - 52.0 %   MCV 94.7 80.0 - 100.0 fL   MCH 30.3 26.0 - 34.0 pg   MCHC 32.0 30.0 - 36.0 g/dL   RDW 12.1 11.5 - 15.5 %   Platelets 274 150 - 400 K/uL   nRBC 0.0 0.0 - 0.2 %   Neutrophils Relative % 91 %   Neutro Abs 11.1 (H) 1.7 - 7.7 K/uL   Lymphocytes Relative 3 %   Lymphs Abs 0.4 (L) 0.7 - 4.0 K/uL   Monocytes Relative 5 %   Monocytes Absolute 0.7 0.1 - 1.0 K/uL   Eosinophils Relative 1 %   Eosinophils Absolute 0.1 0.0 - 0.5 K/uL   Basophils Relative 0 %   Basophils Absolute 0.0 0.0 - 0.1 K/uL   Immature Granulocytes 0 %   Abs Immature Granulocytes 0.04 0.00 - 0.07 K/uL    Comment: Performed at [facility], [address], [place], [place] [address]  Blood Culture (routine x 2)     Status: None (Preliminary result)   Collection Time: 04/13/18  6:47 PM  Result Value Ref Range   Specimen Description BLOOD RIGHT FOREARM    Special Requests      BOTTLES DRAWN AEROBIC AND ANAEROBIC Blood Culture adequate volume   Culture      NO GROWTH < 24 HOURS Performed at [facility], [address], [place], [place] [address]    Report Status PENDING   Blood Culture (routine x 2)     Status: None (Preliminary result)   Collection Time: 04/13/18  6:47 PM  Result Value Ref Range   Specimen Description BLOOD RIGHT ANTECUBITAL    Special Requests      BOTTLES DRAWN AEROBIC AND ANAEROBIC Blood Culture adequate volume   Culture      NO GROWTH < 24 HOURS Performed at [facility], [address], [place], [place] [address]    Report Status PENDING   Procalcitonin     Status: None   Collection Time: 04/13/18  6:47 PM  Result Value Ref Range   Procalcitonin 0.13 ng/mL    Comment:        Interpretation: PCT (Procalcitonin) <= 0.5 ng/mL: Systemic infection (sepsis) is not likely. Local bacterial infection is possible. (NOTE)       Sepsis PCT Algorithm  Lower Respiratory Tract                                       Infection PCT Algorithm    ----------------------------     ----------------------------         PCT < 0.25 ng/mL                PCT < 0.10 ng/mL         Strongly encourage             Strongly discourage   discontinuation of antibiotics    initiation of antibiotics    ----------------------------     -----------------------------       PCT 0.25 - 0.50 ng/mL            PCT 0.10 - 0.25 ng/mL               OR       >80% decrease in PCT            Discourage initiation of                                            antibiotics      Encourage discontinuation           of antibiotics    ----------------------------     -----------------------------         PCT >= 0.50 ng/mL              PCT 0.26 - 0.50 ng/mL               AND        <80% decrease in PCT             Encourage initiation of                                             antibiotics       Encourage continuation           of antibiotics    ----------------------------     -----------------------------        PCT >= 0.50 ng/mL                  PCT > 0.50 ng/mL               AND         increase in PCT                  Strongly encourage                                      initiation of antibiotics    Strongly encourage escalation           of antibiotics                                     -----------------------------  PCT <= 0.25 ng/mL                                                 OR                                        > 80% decrease in PCT                                     Discontinue / Do not initiate                                             antibiotics Performed at [facility], [address] [place], [place] [address]   Protime-INR     Status: None   Collection Time: 04/13/18  6:47 PM  Result Value Ref Range   Prothrombin Time 13.3 11.4 - 15.2 seconds   INR 1.02     Comment: Performed at [facility], [address], [place], [place] [address]   APTT     Status: None   Collection Time: 04/13/18  6:47 PM  Result Value Ref Range   aPTT 32 24 - 36 seconds    Comment: Performed at [facility], [address], [place], [place] [address]  Lactic acid, plasma     Status: Abnormal   Collection Time: 04/13/18  8:20 PM  Result Value Ref Range   Lactic Acid, Venous 2.8 (HH) 0.5 - 1.9 mmol/L    Comment: CRITICAL RESULT CALLED TO, READ BACK BY AND VERIFIED WITH: SAPPELT,J ON 04/13/18 AT 2050 BY LOY,C Performed at [facility], [address] [place], [place] [address]   Lactic acid, plasma     Status: Abnormal   Collection Time: 04/13/18 10:00 PM  Result Value Ref Range   Lactic Acid, Venous 2.3 (HH) 0.5 - 1.9 mmol/L    Comment: CRITICAL RESULT CALLED TO, READ BACK BY AND VERIFIED WITH: NORMAN,B AT 2240 ON 2.13.20 BY ISLEY,B Performed at [facility], [address], [place], [place] [address]   Lactic acid, plasma     Status: None   Collection Time: 04/14/18  1:28 AM  Result Value Ref Range   Lactic Acid, Venous 1.9 0.5 - 1.9 mmol/L    Comment: Performed at [facility], [address], [place], [place] [address]  Basic metabolic panel     Status: Abnormal   Collection Time: 04/14/18  1:28 AM  Result Value Ref Range   Sodium 133 (L) 135 - 145 mmol/L   Potassium 4.5 3.5 - 5.1 mmol/L   Chloride 106 98 - 111 mmol/L   CO2 20 (L) 22 - 32 mmol/L   Glucose, Bld 221 (H) 70 - 99 mg/dL   BUN 17 8 - 23 mg/dL   Creatinine, Ser 1.31 (H) 0.61 - 1.24 mg/dL   Calcium 7.6 (L) 8.9 - 10.3 mg/dL   GFR calc non Af Amer 51 (L) >60 mL/min   GFR calc Af Amer 59 (L) >60 mL/min   Anion gap 7 5 - 15    Comment: Performed at  [address] [place], [place] [address]  CBC     Status: Abnormal   Collection Time: 04/14/18  1:28 AM  Result Value Ref Range   WBC 21.5 (H) 4.0 - 10.5 K/uL   RBC 3.79 (L) 4.22 - 5.81 MIL/uL   Hemoglobin 11.6 (L) 13.0 - 17.0 g/dL   HCT 36.4 (L) 39.0 - 52.0 %   MCV 96.0 80.0 - 100.0 fL   MCH 30.6 26.0 - 34.0 pg    MCHC 31.9 30.0 - 36.0 g/dL   RDW 12.2 11.5 - 15.5 %   Platelets 239 150 - 400 K/uL   nRBC 0.0 0.0 - 0.2 %    Comment: Performed at [facility], [address], [place], [place] [address]    ABGS No results for input(s): PHART, PO2ART, TCO2, HCO3 in the last 72 hours.  Invalid input(s): PCO2 CULTURES Recent Results (from the past 240 hour(s))  Blood Culture (routine x 2)     Status: None (Preliminary result)   Collection Time: 04/13/18  6:47 PM  Result Value Ref Range Status   Specimen Description BLOOD RIGHT FOREARM  Final   Special Requests   Final    BOTTLES DRAWN AEROBIC AND ANAEROBIC Blood Culture adequate volume   Culture   Final    NO GROWTH < 24 HOURS Performed at [facility], [address], [place], [place] [address]    Report Status PENDING  Incomplete  Blood Culture (routine x 2)     Status: None (Preliminary result)   Collection Time: 04/13/18  6:47 PM  Result Value Ref Range Status   Specimen Description BLOOD RIGHT ANTECUBITAL  Final   Special Requests   Final    BOTTLES DRAWN AEROBIC AND ANAEROBIC Blood Culture adequate volume   Culture   Final    NO GROWTH < 24 HOURS Performed at [facility], [address], [place], [place] [address]    Report Status PENDING  Incomplete   Studies/Results: Dg Chest 2 View  Result Date: 04/14/2018 CLINICAL DATA:  Cough.  History of bladder carcinoma.  Hypertension. EXAM: CHEST - 2 VIEW COMPARISON:  April 13, 2018 FINDINGS: There is patchy atelectasis in the right mid lung and right base regions. The left lung is clear. Heart size and pulmonary vascularity are normal. No adenopathy. There is aortic atherosclerosis. No appreciable bone lesions. IMPRESSION: Atelectatic change right mid lower lung zones. No edema or consolidation. Stable cardiac silhouette. No adenopathy evident. Aortic Atherosclerosis (ICD10-I70.0). Electronically Signed   By: Lowella Grip III M.D.   On: 04/14/2018 07:53   Dg Chest Portable 1  View  Result Date: 04/13/2018 CLINICAL DATA:  Fever EXAM: PORTABLE CHEST 1 VIEW COMPARISON:  03/18/2017 FINDINGS: The heart size and mediastinal contours are within normal limits. Both lungs are clear. The visualized skeletal structures are unremarkable. IMPRESSION: No active disease. Electronically Signed   By: Rolm Baptise M.D.   On: 04/13/2018 19:12    Medications:  Prior to Admission: (Not in a hospital admission)  Scheduled: . azithromycin  500 mg Oral Daily  . enoxaparin (LOVENOX) injection  40 mg Subcutaneous Q24H  . irbesartan  300 mg Oral Daily  . LORazepam  1 mg Oral Q8H  . pantoprazole  40 mg Oral Daily   Continuous: . sodium chloride 125 mL/hr at 04/14/18 0647  . cefTRIAXone (ROCEPHIN)  IV     SAY:[id number] **OR** acetaminophen, ondansetron **OR** ondansetron (ZOFRAN) IV  Assesment: He was admitted with sepsis related to pneumonia and  possible urinary tract infection.  His sepsis pathophysiology has resolved.  He is still very congested coughing and feels weak.  He has hypertension and is on medication  He had low sodium on admission and that seems better  He had elevated blood sugar which has improved  He has urostomy at baseline.   Principal Problem:   Sepsis ([place]) Active Problems:   Lobar pneumonia ([place])   UTI (urinary tract infection)   Essential hypertension    Plan: Continue IV antibiotics IV fluids etc.    LOS: 1 day   Alonza Bogus 04/14/2018, 9:08 AM

## 2018-04-15 ENCOUNTER — Encounter (HOSPITAL_COMMUNITY): Payer: Self-pay

## 2018-04-15 MED ORDER — BENZONATATE 100 MG PO CAPS
200.0000 mg | ORAL_CAPSULE | Freq: Three times a day (TID) | ORAL | Status: DC
  Administered 2018-04-15 – 2018-04-16 (×4): 200 mg via ORAL
  Filled 2018-04-15 (×4): qty 2

## 2018-04-15 MED ORDER — BENZONATATE 100 MG PO CAPS: 200.0000 mg | ORAL_CAPSULE | Freq: Three times a day (TID) | ORAL | Status: DC | PRN

## 2018-04-15 NOTE — Progress Notes (Signed)
Subjective: He feels a little better.  He is still having significant cough.  He is not able to cough anything up.  Poor appetite.  Objective: Vital signs in last 24 hours: Temp:  [97.9 F (36.6 C)-98.6 F (37 C)] 97.9 F (36.6 C) (02/15 0539) Pulse Rate:  [80-94] 87 (02/15 0941) Resp:  [18-20] 18 (02/15 0941) BP: (115-140)/(63-86) 124/67 (02/15 0941) SpO2:  [95 %-98 %] 97 % (02/15 0941) Weight:  [77.1 kg] 77.1 kg (02/14 1050) Weight change: -0.012 kg Last BM Date: 04/14/18  Intake/Output from previous day: 02/14 0701 - 02/15 0700 In: 2127.6 [P.O.:720; I.V.:1307.6; IV Piggyback:100] Out: 3225 [Urine:3225]  PHYSICAL EXAM General appearance: alert, cooperative and mild distress Resp: He has rales and rhonchi on the right. Cardio: regular rate and rhythm, S1, S2 normal, no murmur, click, rub or gallop GI: soft, non-tender; bowel sounds normal; no masses,  no organomegaly Extremities: extremities normal, atraumatic, no cyanosis or edema  Lab Results:  Results for orders placed or performed during the hospital encounter of 04/13/18 (from the past 48 hour(s))  Urinalysis, Routine w reflex microscopic     Status: Abnormal   Collection Time: 04/13/18  6:27 PM  Result Value Ref Range   Color, Urine YELLOW YELLOW   APPearance CLOUDY (A) CLEAR   Specific Gravity, Urine 1.012 1.005 - 1.030   pH 7.0 5.0 - 8.0   Glucose, UA 150 (A) NEGATIVE mg/dL   Hgb urine dipstick SMALL (A) NEGATIVE   Bilirubin Urine NEGATIVE NEGATIVE   Ketones, ur NEGATIVE NEGATIVE mg/dL   Protein, ur 100 (A) NEGATIVE mg/dL   Nitrite POSITIVE (A) NEGATIVE   Leukocytes,Ua MODERATE (A) NEGATIVE   RBC / HPF 6-10 0 - 5 RBC/hpf   WBC, UA 21-50 0 - 5 WBC/hpf   Bacteria, UA RARE (A) NONE SEEN   Squamous Epithelial / LPF 0-5 0 - 5   Mucus PRESENT     Comment: Performed at [facility], [address] [place], [place] [address]  Influenza panel by PCR (type A & B)     Status: None   Collection Time: 04/13/18  6:28  PM  Result Value Ref Range   Influenza A By PCR NEGATIVE NEGATIVE   Influenza B By PCR NEGATIVE NEGATIVE    Comment: (NOTE) The Xpert Xpress Flu assay is intended as an aid in the diagnosis of  influenza and should not be used as a sole basis for treatment.  This  assay is FDA approved for nasopharyngeal swab specimens only. Nasal  washings and aspirates are unacceptable for Xpert Xpress Flu testing. Performed at [facility], [address], [place], [place] [address]   Lactic acid, plasma     Status: Abnormal   Collection Time: 04/13/18  6:47 PM  Result Value Ref Range   Lactic Acid, Venous 3.0 (HH) 0.5 - 1.9 mmol/L    Comment: CRITICAL RESULT CALLED TO, READ BACK BY AND VERIFIED WITH: SAPPELT,J ON 04/13/18 AT 2010 BY LOY,C Performed at [facility], [address], [place], [place] [address]   Comprehensive metabolic panel     Status: Abnormal   Collection Time: 04/13/18  6:47 PM  Result Value Ref Range   Sodium 131 (L) 135 - 145 mmol/L   Potassium 3.9 3.5 - 5.1 mmol/L   Chloride 103 98 - 111 mmol/L   CO2 17 (L) 22 - 32 mmol/L   Glucose, Bld 289 (H) 70 - 99 mg/dL   BUN 17 8 - 23 mg/dL   Creatinine,  Ser 1.22 0.61 - 1.24 mg/dL   Calcium 8.2 (L) 8.9 - 10.3 mg/dL   Total Protein 7.1 6.5 - 8.1 g/dL   Albumin 3.8 3.5 - 5.0 g/dL   AST 38 15 - 41 U/L   ALT 60 (H) 0 - 44 U/L   Alkaline Phosphatase 77 38 - 126 U/L   Total Bilirubin 0.7 0.3 - 1.2 mg/dL   GFR calc non Af Amer 56 (L) >60 mL/min   GFR calc Af Amer >60 >60 mL/min   Anion gap 11 5 - 15    Comment: Performed at [facility], [address] [place], [place] [address]  CBC WITH DIFFERENTIAL     Status: Abnormal   Collection Time: 04/13/18  6:47 PM  Result Value Ref Range   WBC 12.2 (H) 4.0 - 10.5 K/uL    Comment: WHITE COUNT CONFIRMED ON SMEAR   RBC 4.32 4.22 - 5.81 MIL/uL   Hemoglobin 13.1 13.0 - 17.0 g/dL   HCT 40.9 39.0 - 52.0 %   MCV 94.7 80.0 - 100.0 fL   MCH 30.3 26.0 - 34.0 pg   MCHC 32.0 30.0 - 36.0 g/dL   RDW  12.1 11.5 - 15.5 %   Platelets 274 150 - 400 K/uL   nRBC 0.0 0.0 - 0.2 %   Neutrophils Relative % 91 %   Neutro Abs 11.1 (H) 1.7 - 7.7 K/uL   Lymphocytes Relative 3 %   Lymphs Abs 0.4 (L) 0.7 - 4.0 K/uL   Monocytes Relative 5 %   Monocytes Absolute 0.7 0.1 - 1.0 K/uL   Eosinophils Relative 1 %   Eosinophils Absolute 0.1 0.0 - 0.5 K/uL   Basophils Relative 0 %   Basophils Absolute 0.0 0.0 - 0.1 K/uL   Immature Granulocytes 0 %   Abs Immature Granulocytes 0.04 0.00 - 0.07 K/uL    Comment: Performed at [facility], [address], [place], [place] [address]  Blood Culture (routine x 2)     Status: None (Preliminary result)   Collection Time: 04/13/18  6:47 PM  Result Value Ref Range   Specimen Description BLOOD RIGHT FOREARM    Special Requests      BOTTLES DRAWN AEROBIC AND ANAEROBIC Blood Culture adequate volume   Culture      NO GROWTH 2 DAYS Performed at [facility], [address] [place], [place] [address]    Report Status PENDING   Blood Culture (routine x 2)     Status: None (Preliminary result)   Collection Time: 04/13/18  6:47 PM  Result Value Ref Range   Specimen Description BLOOD RIGHT ANTECUBITAL    Special Requests      BOTTLES DRAWN AEROBIC AND ANAEROBIC Blood Culture adequate volume   Culture      NO GROWTH 2 DAYS Performed at [facility], [address] [place],  [address]    Report Status PENDING   Procalcitonin     Status: None   Collection Time: 04/13/18  6:47 PM  Result Value Ref Range   Procalcitonin 0.13 ng/mL    Comment:        Interpretation: PCT (Procalcitonin) <= 0.5 ng/mL: Systemic infection (sepsis) is not likely. Local bacterial infection is possible. (NOTE)       Sepsis PCT Algorithm           Lower Respiratory Tract  Infection PCT Algorithm    ----------------------------     ----------------------------         PCT < 0.25 ng/mL                PCT < 0.10 ng/mL         Strongly encourage              Strongly discourage   discontinuation of antibiotics    initiation of antibiotics    ----------------------------     -----------------------------       PCT 0.25 - 0.50 ng/mL            PCT 0.10 - 0.25 ng/mL               OR       >80% decrease in PCT            Discourage initiation of                                            antibiotics      Encourage discontinuation           of antibiotics    ----------------------------     -----------------------------         PCT >= 0.50 ng/mL              PCT 0.26 - 0.50 ng/mL               AND        <80% decrease in PCT             Encourage initiation of                                             antibiotics       Encourage continuation           of antibiotics    ----------------------------     -----------------------------        PCT >= 0.50 ng/mL                  PCT > 0.50 ng/mL               AND         increase in PCT                  Strongly encourage                                      initiation of antibiotics    Strongly encourage escalation           of antibiotics                                     -----------------------------                                           PCT <= 0.25 ng/mL  OR                                        > 80% decrease in PCT                                     Discontinue / Do not initiate                                             antibiotics Performed at [facility], [address], [place], [place] [address]   Protime-INR     Status: None   Collection Time: 04/13/18  6:47 PM  Result Value Ref Range   Prothrombin Time 13.3 11.4 - 15.2 seconds   INR 1.02     Comment: Performed at [facility], [address] [place], [place] [address]  APTT     Status: None   Collection Time: 04/13/18  6:47 PM  Result Value Ref Range   aPTT 32 24 - 36 seconds    Comment: Performed at [facility], [address] [place], [place] [address]  Lactic  acid, plasma     Status: Abnormal   Collection Time: 04/13/18  8:20 PM  Result Value Ref Range   Lactic Acid, Venous 2.8 (HH) 0.5 - 1.9 mmol/L    Comment: CRITICAL RESULT CALLED TO, READ BACK BY AND VERIFIED WITH: SAPPELT,J ON 04/13/18 AT 2050 BY LOY,C Performed at [facility], [address], [place], [place] [address]   Lactic acid, plasma     Status: Abnormal   Collection Time: 04/13/18 10:00 PM  Result Value Ref Range   Lactic Acid, Venous 2.3 (HH) 0.5 - 1.9 mmol/L    Comment: CRITICAL RESULT CALLED TO, READ BACK BY AND VERIFIED WITH: NORMAN,B AT 2240 ON 2.13.20 BY ISLEY,B Performed at [facility], [address] [place], [place] [address]   Lactic acid, plasma     Status: None   Collection Time: 04/14/18  1:28 AM  Result Value Ref Range   Lactic Acid, Venous 1.9 0.5 - 1.9 mmol/L    Comment: Performed at [facility], [address] [place], [place] [address]  Basic metabolic panel     Status: Abnormal   Collection Time: 04/14/18  1:28 AM  Result Value Ref Range   Sodium 133 (L) 135 - 145 mmol/L   Potassium 4.5 3.5 - 5.1 mmol/L   Chloride 106 98 - 111 mmol/L   CO2 20 (L) 22 - 32 mmol/L   Glucose, Bld 221 (H) 70 - 99 mg/dL   BUN 17 8 - 23 mg/dL   Creatinine, Ser 1.31 (H) 0.61 - 1.24 mg/dL   Calcium 7.6 (L) 8.9 - 10.3 mg/dL   GFR calc non Af Amer 51 (L) >60 mL/min   GFR calc Af Amer 59 (L) >60 mL/min   Anion gap 7 5 - 15    Comment: Performed at [facility], [address] [place], [place] [address]  CBC     Status: Abnormal   Collection Time: 04/14/18  1:28 AM  Result Value Ref Range   WBC 21.5 (H) 4.0 - 10.5 K/uL   RBC 3.79 (L) 4.22 - 5.81 MIL/uL   Hemoglobin 11.6 (  L) 13.0 - 17.0 g/dL   HCT 36.4 (L) 39.0 - 52.0 %   MCV 96.0 80.0 - 100.0 fL   MCH 30.6 26.0 - 34.0 pg   MCHC 31.9 30.0 - 36.0 g/dL   RDW 12.2 11.5 - 15.5 %   Platelets 239 150 - 400 K/uL   nRBC 0.0 0.0 - 0.2 %    Comment: Performed at [facility], [address], [place], [place] [address]     ABGS No results for input(s): PHART, PO2ART, TCO2, HCO3 in the last 72 hours.  Invalid input(s): PCO2 CULTURES Recent Results (from the past 240 hour(s))  Blood Culture (routine x 2)     Status: None (Preliminary result)   Collection Time: 04/13/18  6:47 PM  Result Value Ref Range Status   Specimen Description BLOOD RIGHT FOREARM  Final   Special Requests   Final    BOTTLES DRAWN AEROBIC AND ANAEROBIC Blood Culture adequate volume   Culture   Final    NO GROWTH 2 DAYS Performed at [facility], [address], [place], [place] [address]    Report Status PENDING  Incomplete  Blood Culture (routine x 2)     Status: None (Preliminary result)   Collection Time: 04/13/18  6:47 PM  Result Value Ref Range Status   Specimen Description BLOOD RIGHT ANTECUBITAL  Final   Special Requests   Final    BOTTLES DRAWN AEROBIC AND ANAEROBIC Blood Culture adequate volume   Culture   Final    NO GROWTH 2 DAYS Performed at [facility], [address] [place],  [address]    Report Status PENDING  Incomplete   Studies/Results: Dg Chest 2 View  Result Date: 04/14/2018 CLINICAL DATA:  Cough.  History of bladder carcinoma.  Hypertension. EXAM: CHEST - 2 VIEW COMPARISON:  April 13, 2018 FINDINGS: There is patchy atelectasis in the right mid lung and right base regions. The left lung is clear. Heart size and pulmonary vascularity are normal. No adenopathy. There is aortic atherosclerosis. No appreciable bone lesions. IMPRESSION: Atelectatic change right mid lower lung zones. No edema or consolidation. Stable cardiac silhouette. No adenopathy evident. Aortic Atherosclerosis (ICD10-I70.0). Electronically Signed   By: Lowella Grip III M.D.   On: 04/14/2018 07:53   Dg Chest Portable 1 View  Result Date: 04/13/2018 CLINICAL DATA:  Fever EXAM: PORTABLE CHEST 1 VIEW COMPARISON:  03/18/2017 FINDINGS: The heart size and mediastinal contours are within normal limits. Both lungs are clear. The  visualized skeletal structures are unremarkable. IMPRESSION: No active disease. Electronically Signed   By: Rolm Baptise M.D.   On: 04/13/2018 19:12    Medications:  Prior to Admission:  Medications Prior to Admission  Medication Sig Dispense Refill Last Dose  . LORazepam (ATIVAN) 1 MG tablet Take 1 tablet by mouth every 8 (eight) hours.   04/13/2018 at Unknown time  . omeprazole (PRILOSEC) 20 MG capsule Take 1 capsule by mouth daily.   04/12/2018 at Unknown time  . telmisartan (MICARDIS) 80 MG tablet Take 80 mg by mouth at bedtime.   04/13/2018 at Unknown time  . meclizine (ANTIVERT) 25 MG tablet Take 1 tablet (25 mg total) by mouth 3 (three) times daily as needed for dizziness. 12 tablet 0   . ondansetron (ZOFRAN ODT) 4 MG disintegrating tablet 4mg  ODT q4 hours prn nausea/vomit 12 tablet 0    Scheduled: . azithromycin  500 mg Oral Daily  . benzonatate  200 mg Oral TID  . enoxaparin (LOVENOX) injection  40 mg Subcutaneous Q24H  . guaiFENesin  1,200 mg Oral BID  . ipratropium-albuterol  3 mL Nebulization Q4H  . irbesartan  300 mg Oral Daily  . LORazepam  1 mg Oral Q8H  . pantoprazole  40 mg Oral Daily   Continuous: . sodium chloride 125 mL/hr at 04/15/18 0650  . cefTRIAXone (ROCEPHIN)  IV 2 g (04/14/18 1434)   GGY:IRSWNIOEVOJJK **OR** acetaminophen, guaiFENesin-dextromethorphan, ondansetron **OR** ondansetron (ZOFRAN) IV  Assesment: He was admitted with pneumonia and possible UTI with sepsis.  His pneumonia is in the right lower lobe.  He has cleared sepsis pathophysiology.  He is still short of breath and coughing.  There was concern that he had UTI but his urine culture is negative.  He has hypertension at baseline which is stable Principal Problem:   Sepsis ([place]) Active Problems:   Lobar pneumonia ([place])   UTI (urinary tract infection)   Essential hypertension    Plan: Continue current treatments.  Continue IV antibiotics.  Add Tessalon.  Try to get him up.    LOS: 2 days    Alonza Bogus 04/15/2018, 10:04 AM

## 2018-04-16 LAB — URINE CULTURE: Culture: 80000 — AB

## 2018-04-16 MED ORDER — GUAIFENESIN ER 600 MG PO TB12: 600.0000 mg | ORAL_TABLET | Freq: Two times a day (BID) | ORAL | 2 refills | Status: AC

## 2018-04-16 MED ORDER — PREDNISONE 10 MG (21) PO TBPK: ORAL_TABLET | ORAL | 0 refills | Status: DC

## 2018-04-16 MED ORDER — AZITHROMYCIN 250 MG PO TABS: ORAL_TABLET | ORAL | 0 refills | Status: DC

## 2018-04-16 MED ORDER — CEFDINIR 300 MG PO CAPS: 300.0000 mg | ORAL_CAPSULE | Freq: Two times a day (BID) | ORAL | 0 refills | Status: DC

## 2018-04-16 MED ORDER — HYDROCODONE-HOMATROPINE 5-1.5 MG/5ML PO SYRP: 5.0000 mL | ORAL_SOLUTION | Freq: Four times a day (QID) | ORAL | 0 refills | Status: DC | PRN

## 2018-04-16 NOTE — Discharge Summary (Signed)
Physician Discharge Summary  Patient ID: DONNIVAN VILLENA MRN: [id number] DOB/AGE: [birth year] 83 y.o. Primary Care Physician:Darlisha Kelm, Percell Miller, MD Admit date: 04/13/2018 Discharge date: 04/16/2018    Discharge Diagnoses:   Principal Problem:   Sepsis [facility]) Active Problems:   Lobar pneumonia ([place])   UTI (urinary tract infection)   Essential hypertension   Allergies as of 04/16/2018      Reactions   Percocet [oxycodone-acetaminophen]    Patient says he can take this for pain-just bothered his stomach a little bit before      Medication List    TAKE these medications   azithromycin 250 MG tablet Commonly known as:  ZITHROMAX Z-PAK Take by package instructions   cefdinir 300 MG capsule Commonly known as:  OMNICEF Take 1 capsule (300 mg total) by mouth 2 (two) times daily.   guaiFENesin 600 MG 12 hr tablet Commonly known as:  MUCINEX Take 1 tablet (600 mg total) by mouth 2 (two) times daily for 10 days.   HYDROcodone-homatropine 5-1.5 MG/5ML syrup Commonly known as:  HYCODAN Take 5 mLs by mouth every 6 (six) hours as needed for cough.   LORazepam 1 MG tablet Commonly known as:  ATIVAN Take 1 tablet by mouth every 8 (eight) hours.   meclizine 25 MG tablet Commonly known as:  ANTIVERT Take 1 tablet (25 mg total) by mouth 3 (three) times daily as needed for dizziness.   omeprazole 20 MG capsule Commonly known as:  PRILOSEC Take 1 capsule by mouth daily.   ondansetron 4 MG disintegrating tablet Commonly known as:  ZOFRAN ODT 4mg  ODT q4 hours prn nausea/vomit   predniSONE 10 MG (21) Tbpk tablet Commonly known as:  STERAPRED UNI-PAK 21 TAB Take by package instructions   telmisartan 80 MG tablet Commonly known as:  MICARDIS Take 80 mg by mouth at bedtime.       Discharged Condition: Improved    Consults: None  Significant Diagnostic Studies: Dg Chest 2 View  Result Date: 04/14/2018 CLINICAL DATA:  Cough.  History of bladder carcinoma.  Hypertension.  EXAM: CHEST - 2 VIEW COMPARISON:  April 13, 2018 FINDINGS: There is patchy atelectasis in the right mid lung and right base regions. The left lung is clear. Heart size and pulmonary vascularity are normal. No adenopathy. There is aortic atherosclerosis. No appreciable bone lesions. IMPRESSION: Atelectatic change right mid lower lung zones. No edema or consolidation. Stable cardiac silhouette. No adenopathy evident. Aortic Atherosclerosis (ICD10-I70.0). Electronically Signed   By: Lowella Grip III M.D.   On: 04/14/2018 07:53   Dg Chest Portable 1 View  Result Date: 04/13/2018 CLINICAL DATA:  Fever EXAM: PORTABLE CHEST 1 VIEW COMPARISON:  03/18/2017 FINDINGS: The heart size and mediastinal contours are within normal limits. Both lungs are clear. The visualized skeletal structures are unremarkable. IMPRESSION: No active disease. Electronically Signed   By: Rolm Baptise M.D.   On: 04/13/2018 19:12    Lab Results: Basic Metabolic Panel: Recent Labs    04/13/18 1847 04/14/18 0128  NA 131* 133*  K 3.9 4.5  CL 103 106  CO2 17* 20*  GLUCOSE 289* 221*  BUN 17 17  CREATININE 1.22 1.31*  CALCIUM 8.2* 7.6*   Liver Function Tests: Recent Labs    04/13/18 1847  AST 38  ALT 60*  ALKPHOS 77  BILITOT 0.7  PROT 7.1  ALBUMIN 3.8     CBC: Recent Labs    04/13/18 1847 04/14/18 0128  WBC 12.2* 21.5*  NEUTROABS 11.1*  --  HGB 13.1 11.6*  HCT 40.9 36.4*  MCV 94.7 96.0  PLT 274 239    Recent Results (from the past 240 hour(s))  Blood Culture (routine x 2)     Status: None (Preliminary result)   Collection Time: 04/13/18  6:47 PM  Result Value Ref Range Status   Specimen Description BLOOD RIGHT FOREARM  Final   Special Requests   Final    BOTTLES DRAWN AEROBIC AND ANAEROBIC Blood Culture adequate volume   Culture   Final    NO GROWTH 3 DAYS Performed at [facility], [address], [place], [place] [address]    Report Status PENDING  Incomplete  Blood Culture (routine x 2)      Status: None (Preliminary result)   Collection Time: 04/13/18  6:47 PM  Result Value Ref Range Status   Specimen Description BLOOD RIGHT ANTECUBITAL  Final   Special Requests   Final    BOTTLES DRAWN AEROBIC AND ANAEROBIC Blood Culture adequate volume   Culture   Final    NO GROWTH 3 DAYS Performed at [facility], [address], [place], [place] [address]    Report Status PENDING  Incomplete  Urine Culture     Status: Abnormal   Collection Time: 04/13/18  8:56 PM  Result Value Ref Range Status   Specimen Description   Final    URINE, RANDOM Performed at [facility], [address] [place], [place] [address]    Special Requests   Final    NONE Performed at [facility], [address], [place], [place] [address]    Culture 80,000 COLONIES/mL PROVIDENCIA STUARTII (A)  Final   Report Status 04/16/2018 FINAL  Final   Organism ID, Bacteria PROVIDENCIA STUARTII (A)  Final      Susceptibility   Providencia stuartii - MIC*    AMPICILLIN >=32 RESISTANT Resistant     CEFAZOLIN >=64 RESISTANT Resistant     CEFTRIAXONE <=1 SENSITIVE Sensitive     CIPROFLOXACIN 2 INTERMEDIATE Intermediate     GENTAMICIN RESISTANT Resistant     IMIPENEM 2 SENSITIVE Sensitive     NITROFURANTOIN 128 RESISTANT Resistant     TRIMETH/SULFA <=20 SENSITIVE Sensitive     AMPICILLIN/SULBACTAM >=32 RESISTANT Resistant     PIP/TAZO <=4 SENSITIVE Sensitive     * 80,000 COLONIES/mL PROVIDENCIA STUARTII     Hospital Course: This is an 83 year old he came to the emergency department because of increasing cough and congestion.  He been sick for several days.  In the emergency department he was noted to have what looked like atelectasis on chest x-ray but with his clinical situation with fever and sepsis it was felt that he had pneumonia.  He was started on intravenous antibiotics and improved.  There was concern that he had urinary tract infection and he did grow 80,000 colonies.  This was sensitive to ceftriaxone that  he was on.  He had a lot of trouble with cough.  He resolved sepsis pathophysiology with fluid replacement.  By the time of discharge he was still coughing but felt he would be much more comfortable at home so he was discharged home on oral antibiotics.  Discharge Exam: Blood pressure (!) 163/85, pulse 89, temperature 98.3 F (36.8 C), temperature source Oral, resp. rate 18, height 6\' 2"  (1.88 m), weight 97.1 kg, SpO2 96 %. He is awake and alert.  Chest shows rhonchi..  Heart is regular.  Disposition: Home      Signed: Alonza Bogus  04/16/2018, 10:21 AM

## 2018-04-16 NOTE — Progress Notes (Signed)
Patient states understanding of discharge instructions.  

## 2018-04-16 NOTE — Progress Notes (Signed)
Subjective: He says he feels somewhat better and wants to go home.  He is still coughing.  He has urinary tract infection.  However his blood pressure is better and he feels like he would be much more comfortable at home  Objective: Vital signs in last 24 hours: Temp:  [98.3 F (36.8 C)-98.6 F (37 C)] 98.3 F (36.8 C) (02/16 0521) Pulse Rate:  [83-92] 89 (02/16 0521) Resp:  [17-20] 18 (02/15 2210) BP: (133-163)/(62-85) 163/85 (02/16 0521) SpO2:  [96 %-99 %] 96 % (02/16 0734) Weight:  [97.1 kg] 97.1 kg (02/16 0521) Weight change: 20 kg Last BM Date: 04/16/18  Intake/Output from previous day: 02/15 0701 - 02/16 0700 In: 1727.4 [P.O.:960; I.V.:767.4] Out: 1900 [Urine:1900]  PHYSICAL EXAM General appearance: alert, cooperative and no distress Resp: rhonchi Left greater than right Cardio: regular rate and rhythm, S1, S2 normal, no murmur, click, rub or gallop GI: soft, non-tender; bowel sounds normal; no masses,  no organomegaly Extremities: extremities normal, atraumatic, no cyanosis or edema  Lab Results:  No results found for this or any previous visit (from the past 48 hour(s)).  ABGS No results for input(s): PHART, PO2ART, TCO2, HCO3 in the last 72 hours.  Invalid input(s): PCO2 CULTURES Recent Results (from the past 240 hour(s))  Blood Culture (routine x 2)     Status: None (Preliminary result)   Collection Time: 04/13/18  6:47 PM  Result Value Ref Range Status   Specimen Description BLOOD RIGHT FOREARM  Final   Special Requests   Final    BOTTLES DRAWN AEROBIC AND ANAEROBIC Blood Culture adequate volume   Culture   Final    NO GROWTH 3 DAYS Performed at [facility], [address] [place], [place] [address]    Report Status PENDING  Incomplete  Blood Culture (routine x 2)     Status: None (Preliminary result)   Collection Time: 04/13/18  6:47 PM  Result Value Ref Range Status   Specimen Description BLOOD RIGHT ANTECUBITAL  Final   Special Requests   Final   BOTTLES DRAWN AEROBIC AND ANAEROBIC Blood Culture adequate volume   Culture   Final    NO GROWTH 3 DAYS Performed at [facility], [address], [place], [place] [address]    Report Status PENDING  Incomplete  Urine Culture     Status: Abnormal   Collection Time: 04/13/18  8:56 PM  Result Value Ref Range Status   Specimen Description   Final    URINE, RANDOM Performed at [facility], [address] [place], [place] [address]    Special Requests   Final    NONE Performed at [facility], [address], [place], [place] [address]    Culture 80,000 COLONIES/mL PROVIDENCIA STUARTII (A)  Final   Report Status 04/16/2018 FINAL  Final   Organism ID, Bacteria PROVIDENCIA STUARTII (A)  Final      Susceptibility   Providencia stuartii - MIC*    AMPICILLIN >=32 RESISTANT Resistant     CEFAZOLIN >=64 RESISTANT Resistant     CEFTRIAXONE <=1 SENSITIVE Sensitive     CIPROFLOXACIN 2 INTERMEDIATE Intermediate     GENTAMICIN RESISTANT Resistant     IMIPENEM 2 SENSITIVE Sensitive     NITROFURANTOIN 128 RESISTANT Resistant     TRIMETH/SULFA <=20 SENSITIVE Sensitive     AMPICILLIN/SULBACTAM >=32 RESISTANT Resistant     PIP/TAZO <=4 SENSITIVE Sensitive     * 80,000 COLONIES/mL PROVIDENCIA STUARTII   Studies/Results: No results found.  Medications:  Prior to Admission:  Medications Prior to Admission  Medication Sig Dispense Refill Last Dose  . LORazepam (ATIVAN) 1 MG tablet Take 1 tablet by mouth every 8 (eight) hours.   04/13/2018 at Unknown time  . omeprazole (PRILOSEC) 20 MG capsule Take 1 capsule by mouth daily.   04/12/2018 at Unknown time  . telmisartan (MICARDIS) 80 MG tablet Take 80 mg by mouth at bedtime.   04/13/2018 at Unknown time  . meclizine (ANTIVERT) 25 MG tablet Take 1 tablet (25 mg total) by mouth 3 (three) times daily as needed for dizziness. 12 tablet 0   . ondansetron (ZOFRAN ODT) 4 MG disintegrating tablet 4mg  ODT q4 hours prn nausea/vomit 12 tablet 0    Scheduled: .  azithromycin  500 mg Oral Daily  . benzonatate  200 mg Oral TID  . enoxaparin (LOVENOX) injection  40 mg Subcutaneous Q24H  . guaiFENesin  1,200 mg Oral BID  . ipratropium-albuterol  3 mL Nebulization Q4H  . irbesartan  300 mg Oral Daily  . LORazepam  1 mg Oral Q8H  . pantoprazole  40 mg Oral Daily   Continuous: . sodium chloride 125 mL/hr at 04/16/18 0647  . cefTRIAXone (ROCEPHIN)  IV 2 g (04/15/18 1420)   HLK:[id number] **OR** acetaminophen, ondansetron **OR** ondansetron (ZOFRAN) IV  Assesment: He was admitted with community-acquired pneumonia and sepsis.  His sepsis pathophysiology has resolved.  He has 80,000 colonies of providencia in his urine.  He has hypertension which is well controlled Principal Problem:   Sepsis ([place]) Active Problems:   Lobar pneumonia ([place])   UTI (urinary tract infection)   Essential hypertension    Plan: Discharge home today    LOS: 3 days   Alonza Bogus 04/16/2018, 10:03 AM

## 2018-04-17 NOTE — ED Provider Notes (Signed)
[place] [facility] Provider Note   CSN: [id number] Arrival date & time: 04/13/18  1808    History   Chief Complaint Chief Complaint  Patient presents with  . Fatigue    HPI Benjamin Yang is a 83 y.o. male.     Patient came to emergency department with weakness and cough  The history is provided by the patient. No language interpreter was used.  Cough  Cough characteristics:  Productive Sputum characteristics:  Nondescript Severity:  Moderate Onset quality:  Sudden Timing:  Constant Progression:  Worsening Chronicity:  New Smoker: no   Context: not animal exposure   Relieved by:  Nothing Worsened by:  Nothing Ineffective treatments:  None tried Associated symptoms: no chest pain, no eye discharge, no headaches and no rash     Past Medical History:  Diagnosis Date  . Cancer ([place])    bladder  . Hypertension   . Prostatitis     Patient Active Problem List   Diagnosis Date Noted  . Sepsis ([place]) 04/13/2018  . Lobar pneumonia ([place]) 04/13/2018  . UTI (urinary tract infection) 04/13/2018  . Essential hypertension 04/13/2018    Past Surgical History:  Procedure Laterality Date  . BACK SURGERY    . CYSTOSCOPY N/A 09/15/2012   Procedure: CYSTOSCOPY;  Surgeon: Alexis Frock, MD;  Location: [facility];  Service: Urology;  Laterality: N/A;  . LYMPHADENECTOMY Bilateral 09/15/2012   Procedure:  Bilateral Pelvic Lymph Node Dissection;  Surgeon: Alexis Frock, MD;  Location: [facility];  Service: Urology;  Laterality: Bilateral;  . ROBOT ASSISTED LAPAROSCOPIC COMPLETE CYSTECT ILEAL CONDUIT N/A 09/15/2012   Procedure: Robotic Cystoprostatectomy, Open Ileal Conduit Diversion,  Indocyanine Green Dye Injection into Bladder ;  Surgeon: Alexis Frock, MD;  Location: [facility];  Service: Urology;  Laterality: N/A;  Robotic Cystoprostatectomy, Bilateral Pelvic Lymph Node Dissection, Open Ileal Conduit Diversion, Cysto, Indocyanine Green Dye Injection into Bladder    .  ROBOTIC ASSISTED LAPAROSCOPIC LYSIS OF ADHESION N/A 09/15/2012   Procedure: ROBOTIC ASSISTED LAPAROSCOPIC LYSIS OF ADHESION;  Surgeon: Alexis Frock, MD;  Location: [facility];  Service: Urology;  Laterality: N/A;        Home Medications    Prior to Admission medications   Medication Sig Start Date End Date Taking? Authorizing Provider  LORazepam (ATIVAN) 1 MG tablet Take 1 tablet by mouth every 8 (eight) hours. 03/18/17  Yes [provider]  omeprazole (PRILOSEC) 20 MG capsule Take 1 capsule by mouth daily. 02/17/17  Yes [provider]  telmisartan (MICARDIS) 80 MG tablet Take 80 mg by mouth at bedtime.   Yes [provider]  azithromycin (ZITHROMAX Z-PAK) 250 MG tablet Take by package instructions 04/16/18   Sinda Du, MD  cefdinir (OMNICEF) 300 MG capsule Take 1 capsule (300 mg total) by mouth 2 (two) times daily. 04/16/18   Sinda Du, MD  guaiFENesin (MUCINEX) 600 MG 12 hr tablet Take 1 tablet (600 mg total) by mouth 2 (two) times daily for 10 days. 04/16/18 04/26/18  Sinda Du, MD  HYDROcodone-homatropine [facility]) 5-1.5 MG/5ML syrup Take 5 mLs by mouth every 6 (six) hours as needed for cough. 04/16/18   Sinda Du, MD  meclizine (ANTIVERT) 25 MG tablet Take 1 tablet (25 mg total) by mouth 3 (three) times daily as needed for dizziness. 03/18/17   Milton Ferguson, MD  ondansetron (ZOFRAN ODT) 4 MG disintegrating tablet 4mg  ODT q4 hours prn nausea/vomit 03/18/17   Milton Ferguson, MD  predniSONE (STERAPRED UNI-PAK 21 TAB) 10  MG (21) TBPK tablet Take by package instructions 04/16/18   Sinda Du, MD    Family History No family history on file.  Social History Social History   Tobacco Use  . Smoking status: Never Smoker  . Smokeless tobacco: Never Used  Substance Use Topics  . Alcohol use: Yes  . Drug use: No     Allergies   Percocet [oxycodone-acetaminophen]   Review of Systems Review of Systems  Constitutional: Negative for  appetite change and fatigue.  HENT: Negative for congestion, ear discharge and sinus pressure.   Eyes: Negative for discharge.  Respiratory: Positive for cough.   Cardiovascular: Negative for chest pain.  Gastrointestinal: Negative for abdominal pain and diarrhea.  Genitourinary: Negative for frequency and hematuria.  Musculoskeletal: Negative for back pain.  Skin: Negative for rash.  Neurological: Negative for seizures and headaches.  Psychiatric/Behavioral: Negative for hallucinations.     Physical Exam Updated Vital Signs BP (!) 163/85 (BP Location: Right Arm)   Pulse 89   Temp 98.3 F (36.8 C) (Oral)   Resp 18   Ht 6\' 2"  (1.88 m)   Wt 97.1 kg   SpO2 96%   BMI 27.48 kg/m   Physical Exam Vitals signs and nursing note reviewed.  Constitutional:      Appearance: He is well-developed.  HENT:     Head: Normocephalic.     Nose: Nose normal.  Eyes:     General: No scleral icterus.    Conjunctiva/sclera: Conjunctivae normal.  Neck:     Musculoskeletal: Neck supple.     Thyroid: No thyromegaly.  Cardiovascular:     Rate and Rhythm: Normal rate and regular rhythm.     Heart sounds: No murmur. No friction rub. No gallop.   Pulmonary:     Breath sounds: No stridor. No wheezing or rales.  Chest:     Chest wall: No tenderness.  Abdominal:     General: There is no distension.     Tenderness: There is no abdominal tenderness. There is no rebound.  Musculoskeletal: Normal range of motion.  Lymphadenopathy:     Cervical: No cervical adenopathy.  Skin:    Findings: No erythema or rash.  Neurological:     Mental Status: He is oriented to person, place, and time.     Motor: No abnormal muscle tone.     Coordination: Coordination normal.  Psychiatric:        Behavior: Behavior normal.      ED Treatments / Results  Labs (all labs ordered are listed, but only abnormal results are displayed) Labs Reviewed  URINE CULTURE - Abnormal; Notable for the following components:       Result Value   Culture 80,000 COLONIES/mL PROVIDENCIA STUARTII (*)    Organism ID, Bacteria PROVIDENCIA STUARTII (*)    All other components within normal limits  LACTIC ACID, PLASMA - Abnormal; Notable for the following components:   Lactic Acid, Venous 3.0 (*)    All other components within normal limits  LACTIC ACID, PLASMA - Abnormal; Notable for the following components:   Lactic Acid, Venous 2.8 (*)    All other components within normal limits  COMPREHENSIVE METABOLIC PANEL - Abnormal; Notable for the following components:   Sodium 131 (*)    CO2 17 (*)    Glucose, Bld 289 (*)    Calcium 8.2 (*)    ALT 60 (*)    GFR calc non Af Amer 56 (*)    All other components within normal  limits  CBC WITH DIFFERENTIAL/PLATELET - Abnormal; Notable for the following components:   WBC 12.2 (*)    Neutro Abs 11.1 (*)    Lymphs Abs 0.4 (*)    All other components within normal limits  URINALYSIS, ROUTINE W REFLEX MICROSCOPIC - Abnormal; Notable for the following components:   APPearance CLOUDY (*)    Glucose, UA 150 (*)    Hgb urine dipstick SMALL (*)    Protein, ur 100 (*)    Nitrite POSITIVE (*)    Leukocytes,Ua MODERATE (*)    Bacteria, UA RARE (*)    All other components within normal limits  LACTIC ACID, PLASMA - Abnormal; Notable for the following components:   Lactic Acid, Venous 2.3 (*)    All other components within normal limits  BASIC METABOLIC PANEL - Abnormal; Notable for the following components:   Sodium 133 (*)    CO2 20 (*)    Glucose, Bld 221 (*)    Creatinine, Ser 1.31 (*)    Calcium 7.6 (*)    GFR calc non Af Amer 51 (*)    GFR calc Af Amer 59 (*)    All other components within normal limits  CBC - Abnormal; Notable for the following components:   WBC 21.5 (*)    RBC 3.79 (*)    Hemoglobin 11.6 (*)    HCT 36.4 (*)    All other components within normal limits  CULTURE, BLOOD (ROUTINE X 2)  CULTURE, BLOOD (ROUTINE X 2)  INFLUENZA PANEL BY PCR (TYPE A &  B)  LACTIC ACID, PLASMA  PROCALCITONIN  PROTIME-INR  APTT    EKG EKG Interpretation  Date/Time:  Thursday April 13 2018 18:51:02 EST Ventricular Rate:  109 PR Interval:    QRS Duration: 84 QT Interval:  308 QTC Calculation: 415 R Axis:   -23 Text Interpretation:  Sinus tachycardia Borderline left axis deviation Borderline T wave abnormalities similar to prior 1/19 Confirmed by Aletta Edouard [phone]) on 04/14/2018 4:53:25 PM   Radiology No results found.  Procedures Procedures (including critical care time)  Medications Ordered in ED Medications  sodium chloride 0.9 % bolus 2,000 mL (0 mLs Intravenous Stopped 04/13/18 1948)  acetaminophen (TYLENOL) tablet 650 mg (650 mg Oral Given 04/13/18 1840)  levofloxacin (LEVAQUIN) IVPB 500 mg (0 mg Intravenous Stopped 04/13/18 1948)  ondansetron (ZOFRAN) injection 4 mg (4 mg Intravenous Given 04/13/18 1907)  benzonatate (TESSALON) capsule 200 mg (200 mg Oral Given 04/13/18 2106)     Initial Impression / Assessment and Plan / ED Course  I have reviewed the triage vital signs and the nursing notes.  Pertinent labs & imaging results that were available during my care of the patient were reviewed by me and considered in my medical decision making (see chart for details).        CRITICAL CARE Performed by: Milton Ferguson Total critical care time: 40 minutes Critical care time was exclusive of separately billable procedures and treating other patients. Critical care was necessary to treat or prevent imminent or life-threatening deterioration. Critical care was time spent personally by me on the following activities: development of treatment plan with patient and/or surrogate as well as nursing, discussions with consultants, evaluation of patient's response to treatment, examination of patient, obtaining history from patient or surrogate, ordering and performing treatments and interventions, ordering and review of laboratory studies,  ordering and review of radiographic studies, pulse oximetry and re-evaluation of patient's condition. Admit for pneumonia Final Clinical Impressions(s) / ED Diagnoses  Final diagnoses:  Pneumonia    ED Discharge Orders         Ordered    predniSONE (STERAPRED UNI-PAK 21 TAB) 10 MG (21) TBPK tablet     04/16/18 1021    cefdinir (OMNICEF) 300 MG capsule  2 times daily     04/16/18 1021    azithromycin (ZITHROMAX Z-PAK) 250 MG tablet     04/16/18 1021    guaiFENesin (MUCINEX) 600 MG 12 hr tablet  2 times daily     04/16/18 1021    HYDROcodone-homatropine (HYCODAN) 5-1.5 MG/5ML syrup  Every 6 hours PRN     04/16/18 1021           Milton Ferguson, MD 04/17/18 1515

## 2018-04-18 LAB — CULTURE, BLOOD (ROUTINE X 2)
Culture: NO GROWTH
Culture: NO GROWTH
Special Requests: ADEQUATE
Special Requests: ADEQUATE

## 2018-04-25 DIAGNOSIS — F329 Major depressive disorder, single episode, unspecified: Secondary | ICD-10-CM | POA: Diagnosis not present

## 2018-04-25 DIAGNOSIS — J189 Pneumonia, unspecified organism: Secondary | ICD-10-CM | POA: Diagnosis not present

## 2018-04-25 DIAGNOSIS — C679 Malignant neoplasm of bladder, unspecified: Secondary | ICD-10-CM | POA: Diagnosis not present

## 2018-06-22 DIAGNOSIS — M542 Cervicalgia: Secondary | ICD-10-CM | POA: Diagnosis not present

## 2018-06-22 DIAGNOSIS — J019 Acute sinusitis, unspecified: Secondary | ICD-10-CM | POA: Diagnosis not present

## 2018-07-04 DIAGNOSIS — J329 Chronic sinusitis, unspecified: Secondary | ICD-10-CM | POA: Diagnosis not present

## 2018-07-04 DIAGNOSIS — F329 Major depressive disorder, single episode, unspecified: Secondary | ICD-10-CM | POA: Diagnosis not present

## 2018-07-04 DIAGNOSIS — M542 Cervicalgia: Secondary | ICD-10-CM | POA: Diagnosis not present

## 2018-09-29 ENCOUNTER — Other Ambulatory Visit: Payer: Self-pay

## 2019-03-13 DIAGNOSIS — Z23 Encounter for immunization: Secondary | ICD-10-CM | POA: Diagnosis not present

## 2019-04-13 DIAGNOSIS — Z23 Encounter for immunization: Secondary | ICD-10-CM | POA: Diagnosis not present

## 2019-04-24 DIAGNOSIS — Z299 Encounter for prophylactic measures, unspecified: Secondary | ICD-10-CM | POA: Diagnosis not present

## 2019-04-24 DIAGNOSIS — K2100 Gastro-esophageal reflux disease with esophagitis, without bleeding: Secondary | ICD-10-CM | POA: Diagnosis not present

## 2019-04-24 DIAGNOSIS — G4700 Insomnia, unspecified: Secondary | ICD-10-CM | POA: Diagnosis not present

## 2019-04-24 DIAGNOSIS — Z6826 Body mass index (BMI) 26.0-26.9, adult: Secondary | ICD-10-CM | POA: Diagnosis not present

## 2019-04-24 DIAGNOSIS — Z2821 Immunization not carried out because of patient refusal: Secondary | ICD-10-CM | POA: Diagnosis not present

## 2019-04-24 DIAGNOSIS — Z789 Other specified health status: Secondary | ICD-10-CM | POA: Diagnosis not present

## 2019-04-24 DIAGNOSIS — I10 Essential (primary) hypertension: Secondary | ICD-10-CM | POA: Diagnosis not present

## 2019-05-16 DIAGNOSIS — I10 Essential (primary) hypertension: Secondary | ICD-10-CM | POA: Diagnosis not present

## 2019-05-16 DIAGNOSIS — Z299 Encounter for prophylactic measures, unspecified: Secondary | ICD-10-CM | POA: Diagnosis not present

## 2019-05-16 DIAGNOSIS — R5383 Other fatigue: Secondary | ICD-10-CM | POA: Diagnosis not present

## 2019-05-16 DIAGNOSIS — G4700 Insomnia, unspecified: Secondary | ICD-10-CM | POA: Diagnosis not present

## 2019-06-27 DIAGNOSIS — G4700 Insomnia, unspecified: Secondary | ICD-10-CM | POA: Diagnosis not present

## 2019-06-27 DIAGNOSIS — I10 Essential (primary) hypertension: Secondary | ICD-10-CM | POA: Diagnosis not present

## 2019-06-27 DIAGNOSIS — Z299 Encounter for prophylactic measures, unspecified: Secondary | ICD-10-CM | POA: Diagnosis not present

## 2019-09-04 DIAGNOSIS — Z299 Encounter for prophylactic measures, unspecified: Secondary | ICD-10-CM | POA: Diagnosis not present

## 2019-09-04 DIAGNOSIS — I10 Essential (primary) hypertension: Secondary | ICD-10-CM | POA: Diagnosis not present

## 2019-09-04 DIAGNOSIS — H6123 Impacted cerumen, bilateral: Secondary | ICD-10-CM | POA: Diagnosis not present

## 2019-09-04 DIAGNOSIS — H9193 Unspecified hearing loss, bilateral: Secondary | ICD-10-CM | POA: Diagnosis not present

## 2019-11-30 DIAGNOSIS — R42 Dizziness and giddiness: Secondary | ICD-10-CM | POA: Diagnosis not present

## 2019-11-30 DIAGNOSIS — I10 Essential (primary) hypertension: Secondary | ICD-10-CM | POA: Diagnosis not present

## 2019-11-30 DIAGNOSIS — G4700 Insomnia, unspecified: Secondary | ICD-10-CM | POA: Diagnosis not present

## 2019-11-30 DIAGNOSIS — Z299 Encounter for prophylactic measures, unspecified: Secondary | ICD-10-CM | POA: Diagnosis not present

## 2019-12-26 DIAGNOSIS — Z23 Encounter for immunization: Secondary | ICD-10-CM | POA: Diagnosis not present

## 2020-05-19 DIAGNOSIS — Z936 Other artificial openings of urinary tract status: Secondary | ICD-10-CM | POA: Diagnosis not present

## 2020-05-19 DIAGNOSIS — G4700 Insomnia, unspecified: Secondary | ICD-10-CM | POA: Diagnosis not present

## 2020-05-19 DIAGNOSIS — I10 Essential (primary) hypertension: Secondary | ICD-10-CM | POA: Diagnosis not present

## 2020-05-19 DIAGNOSIS — Z6827 Body mass index (BMI) 27.0-27.9, adult: Secondary | ICD-10-CM | POA: Diagnosis not present

## 2020-05-19 DIAGNOSIS — Z299 Encounter for prophylactic measures, unspecified: Secondary | ICD-10-CM | POA: Diagnosis not present

## 2020-05-19 DIAGNOSIS — Z789 Other specified health status: Secondary | ICD-10-CM | POA: Diagnosis not present

## 2020-05-30 DIAGNOSIS — Z23 Encounter for immunization: Secondary | ICD-10-CM | POA: Diagnosis not present

## 2020-07-02 ENCOUNTER — Telehealth: Payer: Self-pay

## 2020-07-02 NOTE — Telephone Encounter (Signed)
No new patient per MG

## 2020-07-29 DIAGNOSIS — Z789 Other specified health status: Secondary | ICD-10-CM | POA: Diagnosis not present

## 2020-07-29 DIAGNOSIS — H6123 Impacted cerumen, bilateral: Secondary | ICD-10-CM | POA: Diagnosis not present

## 2020-07-29 DIAGNOSIS — Z299 Encounter for prophylactic measures, unspecified: Secondary | ICD-10-CM | POA: Diagnosis not present

## 2020-07-29 DIAGNOSIS — I10 Essential (primary) hypertension: Secondary | ICD-10-CM | POA: Diagnosis not present

## 2020-08-13 DIAGNOSIS — Z6825 Body mass index (BMI) 25.0-25.9, adult: Secondary | ICD-10-CM | POA: Diagnosis not present

## 2020-08-13 DIAGNOSIS — Z299 Encounter for prophylactic measures, unspecified: Secondary | ICD-10-CM | POA: Diagnosis not present

## 2020-08-13 DIAGNOSIS — H6123 Impacted cerumen, bilateral: Secondary | ICD-10-CM | POA: Diagnosis not present

## 2020-08-13 DIAGNOSIS — I10 Essential (primary) hypertension: Secondary | ICD-10-CM | POA: Diagnosis not present

## 2020-09-02 DIAGNOSIS — Z0000 Encounter for general adult medical examination without abnormal findings: Secondary | ICD-10-CM | POA: Diagnosis not present

## 2020-09-02 DIAGNOSIS — Z125 Encounter for screening for malignant neoplasm of prostate: Secondary | ICD-10-CM | POA: Diagnosis not present

## 2020-09-02 DIAGNOSIS — R030 Elevated blood-pressure reading, without diagnosis of hypertension: Secondary | ICD-10-CM | POA: Diagnosis not present

## 2020-09-02 DIAGNOSIS — Z1331 Encounter for screening for depression: Secondary | ICD-10-CM | POA: Diagnosis not present

## 2020-09-02 DIAGNOSIS — R5383 Other fatigue: Secondary | ICD-10-CM | POA: Diagnosis not present

## 2020-09-02 DIAGNOSIS — E7800 Pure hypercholesterolemia, unspecified: Secondary | ICD-10-CM | POA: Diagnosis not present

## 2020-09-02 DIAGNOSIS — Z6825 Body mass index (BMI) 25.0-25.9, adult: Secondary | ICD-10-CM | POA: Diagnosis not present

## 2020-09-02 DIAGNOSIS — Z79899 Other long term (current) drug therapy: Secondary | ICD-10-CM | POA: Diagnosis not present

## 2020-09-02 DIAGNOSIS — Z7189 Other specified counseling: Secondary | ICD-10-CM | POA: Diagnosis not present

## 2020-09-02 DIAGNOSIS — Z1339 Encounter for screening examination for other mental health and behavioral disorders: Secondary | ICD-10-CM | POA: Diagnosis not present

## 2020-09-02 DIAGNOSIS — Z299 Encounter for prophylactic measures, unspecified: Secondary | ICD-10-CM | POA: Diagnosis not present

## 2020-09-10 DIAGNOSIS — I10 Essential (primary) hypertension: Secondary | ICD-10-CM | POA: Diagnosis not present

## 2020-09-10 DIAGNOSIS — R739 Hyperglycemia, unspecified: Secondary | ICD-10-CM | POA: Diagnosis not present

## 2020-09-10 DIAGNOSIS — E7800 Pure hypercholesterolemia, unspecified: Secondary | ICD-10-CM | POA: Diagnosis not present

## 2020-09-10 DIAGNOSIS — Z299 Encounter for prophylactic measures, unspecified: Secondary | ICD-10-CM | POA: Diagnosis not present

## 2020-10-01 DIAGNOSIS — R809 Proteinuria, unspecified: Secondary | ICD-10-CM | POA: Diagnosis not present

## 2020-10-01 DIAGNOSIS — Z299 Encounter for prophylactic measures, unspecified: Secondary | ICD-10-CM | POA: Diagnosis not present

## 2020-10-01 DIAGNOSIS — E1165 Type 2 diabetes mellitus with hyperglycemia: Secondary | ICD-10-CM | POA: Diagnosis not present

## 2020-10-01 DIAGNOSIS — I10 Essential (primary) hypertension: Secondary | ICD-10-CM | POA: Diagnosis not present

## 2020-10-01 DIAGNOSIS — E1129 Type 2 diabetes mellitus with other diabetic kidney complication: Secondary | ICD-10-CM | POA: Diagnosis not present

## 2020-11-06 DIAGNOSIS — Z23 Encounter for immunization: Secondary | ICD-10-CM | POA: Diagnosis not present

## 2020-12-08 DIAGNOSIS — I10 Essential (primary) hypertension: Secondary | ICD-10-CM | POA: Diagnosis not present

## 2020-12-08 DIAGNOSIS — M549 Dorsalgia, unspecified: Secondary | ICD-10-CM | POA: Diagnosis not present

## 2020-12-08 DIAGNOSIS — E1165 Type 2 diabetes mellitus with hyperglycemia: Secondary | ICD-10-CM | POA: Diagnosis not present

## 2020-12-08 DIAGNOSIS — H6120 Impacted cerumen, unspecified ear: Secondary | ICD-10-CM | POA: Diagnosis not present

## 2020-12-08 DIAGNOSIS — Z299 Encounter for prophylactic measures, unspecified: Secondary | ICD-10-CM | POA: Diagnosis not present

## 2020-12-30 ENCOUNTER — Other Ambulatory Visit: Payer: Self-pay

## 2020-12-30 ENCOUNTER — Emergency Department (HOSPITAL_COMMUNITY): Payer: Medicare Other

## 2020-12-30 ENCOUNTER — Emergency Department (HOSPITAL_COMMUNITY)
Admission: EM | Admit: 2020-12-30 | Discharge: 2020-12-30 | Disposition: A | Discharge status: Home / Self Care | Payer: Medicare Other | Source: Home / Self Care | Attending: Emergency Medicine | Admitting: Emergency Medicine

## 2020-12-30 ENCOUNTER — Encounter (HOSPITAL_COMMUNITY): Payer: Self-pay

## 2020-12-30 DIAGNOSIS — K439 Ventral hernia without obstruction or gangrene: Secondary | ICD-10-CM | POA: Insufficient documentation

## 2020-12-30 DIAGNOSIS — Z936 Other artificial openings of urinary tract status: Secondary | ICD-10-CM | POA: Insufficient documentation

## 2020-12-30 DIAGNOSIS — J9811 Atelectasis: Secondary | ICD-10-CM | POA: Diagnosis not present

## 2020-12-30 DIAGNOSIS — R410 Disorientation, unspecified: Secondary | ICD-10-CM | POA: Diagnosis not present

## 2020-12-30 DIAGNOSIS — N390 Urinary tract infection, site not specified: Secondary | ICD-10-CM | POA: Diagnosis not present

## 2020-12-30 DIAGNOSIS — R1084 Generalized abdominal pain: Secondary | ICD-10-CM | POA: Diagnosis not present

## 2020-12-30 DIAGNOSIS — K8020 Calculus of gallbladder without cholecystitis without obstruction: Secondary | ICD-10-CM | POA: Insufficient documentation

## 2020-12-30 DIAGNOSIS — R109 Unspecified abdominal pain: Secondary | ICD-10-CM | POA: Diagnosis not present

## 2020-12-30 DIAGNOSIS — I10 Essential (primary) hypertension: Secondary | ICD-10-CM | POA: Insufficient documentation

## 2020-12-30 DIAGNOSIS — R1011 Right upper quadrant pain: Secondary | ICD-10-CM | POA: Diagnosis not present

## 2020-12-30 DIAGNOSIS — R1031 Right lower quadrant pain: Secondary | ICD-10-CM | POA: Diagnosis not present

## 2020-12-30 DIAGNOSIS — K82A1 Gangrene of gallbladder in cholecystitis: Secondary | ICD-10-CM | POA: Diagnosis not present

## 2020-12-30 DIAGNOSIS — R4182 Altered mental status, unspecified: Secondary | ICD-10-CM | POA: Diagnosis not present

## 2020-12-30 DIAGNOSIS — R52 Pain, unspecified: Secondary | ICD-10-CM | POA: Diagnosis not present

## 2020-12-30 DIAGNOSIS — K5730 Diverticulosis of large intestine without perforation or abscess without bleeding: Secondary | ICD-10-CM | POA: Insufficient documentation

## 2020-12-30 DIAGNOSIS — K810 Acute cholecystitis: Secondary | ICD-10-CM | POA: Diagnosis not present

## 2020-12-30 DIAGNOSIS — D72829 Elevated white blood cell count, unspecified: Secondary | ICD-10-CM | POA: Insufficient documentation

## 2020-12-30 DIAGNOSIS — K8001 Calculus of gallbladder with acute cholecystitis with obstruction: Secondary | ICD-10-CM | POA: Diagnosis not present

## 2020-12-30 DIAGNOSIS — R000 Tachycardia, unspecified: Secondary | ICD-10-CM | POA: Diagnosis not present

## 2020-12-30 DIAGNOSIS — Z8551 Personal history of malignant neoplasm of bladder: Secondary | ICD-10-CM | POA: Insufficient documentation

## 2020-12-30 DIAGNOSIS — A419 Sepsis, unspecified organism: Secondary | ICD-10-CM | POA: Diagnosis not present

## 2020-12-30 DIAGNOSIS — I517 Cardiomegaly: Secondary | ICD-10-CM | POA: Diagnosis not present

## 2020-12-30 DIAGNOSIS — Z20822 Contact with and (suspected) exposure to covid-19: Secondary | ICD-10-CM | POA: Diagnosis not present

## 2020-12-30 DIAGNOSIS — E871 Hypo-osmolality and hyponatremia: Secondary | ICD-10-CM | POA: Diagnosis not present

## 2020-12-30 DIAGNOSIS — R110 Nausea: Secondary | ICD-10-CM | POA: Diagnosis not present

## 2020-12-30 LAB — COMPREHENSIVE METABOLIC PANEL
ALT: 18 U/L (ref 0–44)
AST: 16 U/L (ref 15–41)
Albumin: 4.3 g/dL (ref 3.5–5.0)
Alkaline Phosphatase: 70 U/L (ref 38–126)
Anion gap: 11 (ref 5–15)
BUN: 14 mg/dL (ref 8–23)
CO2: 23 mmol/L (ref 22–32)
Calcium: 9.1 mg/dL (ref 8.9–10.3)
Chloride: 98 mmol/L (ref 98–111)
Creatinine, Ser: 1.02 mg/dL (ref 0.61–1.24)
GFR, Estimated: >60 mL/min (ref >60)
Glucose, Bld: 189 mg/dL — ABNORMAL HIGH (ref 70–99)
Potassium: 3.9 mmol/L (ref 3.5–5.1)
Sodium: 132 mmol/L — ABNORMAL LOW (ref 135–145)
Total Bilirubin: 1.1 mg/dL (ref 0.3–1.2)
Total Protein: 8.3 g/dL — ABNORMAL HIGH (ref 6.5–8.1)

## 2020-12-30 LAB — CBC
HCT: 44.2 % (ref 39.0–52.0)
Hemoglobin: 15.2 g/dL (ref 13.0–17.0)
MCH: 32.8 pg (ref 26.0–34.0)
MCHC: 34.4 g/dL (ref 30.0–36.0)
MCV: 95.3 fL (ref 80.0–100.0)
Platelets: 272 10*3/uL (ref 150–400)
RBC: 4.64 MIL/uL (ref 4.22–5.81)
RDW: 12.4 % (ref 11.5–15.5)
WBC: 15.2 10*3/uL — ABNORMAL HIGH (ref 4.0–10.5)
nRBC: 0 % (ref 0.0–0.2)

## 2020-12-30 LAB — URINALYSIS, ROUTINE W REFLEX MICROSCOPIC
Bilirubin Urine: NEGATIVE
Glucose, UA: NEGATIVE mg/dL
Ketones, ur: 5 mg/dL — AB
Nitrite: POSITIVE — AB
Protein, ur: 100 mg/dL — AB
Specific Gravity, Urine: 1.014 (ref 1.005–1.030)
WBC, UA: >50 WBC/hpf — ABNORMAL HIGH (ref 0–5)
pH: 7 (ref 5.0–8.0)

## 2020-12-30 LAB — LIPASE, BLOOD: Lipase: 23 U/L (ref 11–51)

## 2020-12-30 MED ORDER — ONDANSETRON HCL 4 MG/2ML IJ SOLN
4.0000 mg | Freq: Once | INTRAMUSCULAR | Status: AC
  Administered 2020-12-30: 4 mg via INTRAVENOUS
  Filled 2020-12-30: qty 2

## 2020-12-30 MED ORDER — HYDROMORPHONE HCL 1 MG/ML IJ SOLN
1.0000 mg | Freq: Once | INTRAMUSCULAR | Status: AC
  Administered 2020-12-30: 1 mg via INTRAVENOUS
  Filled 2020-12-30: qty 1

## 2020-12-30 MED ORDER — ONDANSETRON 4 MG PO TBDP: 4.0000 mg | ORAL_TABLET | Freq: Three times a day (TID) | ORAL | 1 refills | Status: DC | PRN

## 2020-12-30 MED ORDER — HYDROCODONE-ACETAMINOPHEN 5-325 MG PO TABS: 1.0000 | ORAL_TABLET | Freq: Four times a day (QID) | ORAL | 0 refills | Status: DC | PRN

## 2020-12-30 MED ORDER — IOHEXOL 300 MG/ML  SOLN
100.0000 mL | Freq: Once | INTRAMUSCULAR | Status: AC | PRN
  Administered 2020-12-30: 100 mL via INTRAVENOUS

## 2020-12-30 NOTE — ED Notes (Signed)
US at bedside

## 2020-12-30 NOTE — ED Triage Notes (Signed)
Pt reports abd pain x 3 days.   Reports nausea, no vomiting or diarrhea.  LBM was yesterday and was normal per pt.  Reports has been belching a lot recently.  Pt has a urostomy.

## 2020-12-30 NOTE — ED Triage Notes (Signed)
Pt brought in by [facility] from home with c/o right side abdominal pain going around to his back since last night. EMS reports no n/v/d and that vitals are WNL.

## 2020-12-30 NOTE — Discharge Instructions (Addendum)
Amend clear liquid diet for the next couple days.  Then advance to bland food.  Take the hydrocodone as needed for pain.  Can appointment follow-up with general surgery Dr. Arnoldo Morale information provided above.  It appears that she do have gallbladder disease and gallstones and probably will need to have the gallbladder removed.  But that will be up to the surgeons.  Return for any new or worse symptoms in particular return for fever persistent vomiting worse pain.

## 2020-12-30 NOTE — ED Notes (Signed)
Patient transported to CT 

## 2020-12-30 NOTE — ED Provider Notes (Addendum)
[facility] EMERGENCY DEPARTMENT Provider Note   CSN: [id number] Arrival date & time: 12/30/20  0900     History Chief Complaint  Patient presents with   Abdominal Pain    Benjamin Yang is a 85 y.o. male.  Patient with the onset of right-sided right lower quadrant abdominal pain for the past 3 days.  Is been worse overnight.  Associated with nausea but no vomiting no diarrhea.  Patient status post removal of bladder and prostate several years ago due to bladder cancer.  And has a urostomy.  Patient believes he still has his gallbladder.  No fevers.  No upper respiratory symptoms.  Urine flow has been good.      Past Medical History:  Diagnosis Date   Cancer [facility])    bladder   Hypertension    Prostatitis     Patient Active Problem List   Diagnosis Date Noted   Sepsis ([place]) 04/13/2018   Lobar pneumonia ([place]) 04/13/2018   UTI (urinary tract infection) 04/13/2018   Essential hypertension 04/13/2018    Past Surgical History:  Procedure Laterality Date   BACK SURGERY     CYSTOSCOPY N/A 09/15/2012   Procedure: CYSTOSCOPY;  Surgeon: Alexis Frock, MD;  Location: [facility];  Service: Urology;  Laterality: N/A;   LYMPHADENECTOMY Bilateral 09/15/2012   Procedure:  Bilateral Pelvic Lymph Node Dissection;  Surgeon: Alexis Frock, MD;  Location: [facility];  Service: Urology;  Laterality: Bilateral;   ROBOT ASSISTED LAPAROSCOPIC COMPLETE CYSTECT ILEAL CONDUIT N/A 09/15/2012   Procedure: Robotic Cystoprostatectomy, Open Ileal Conduit Diversion,  Indocyanine Green Dye Injection into Bladder ;  Surgeon: Alexis Frock, MD;  Location: [facility];  Service: Urology;  Laterality: N/A;  Robotic Cystoprostatectomy, Bilateral Pelvic Lymph Node Dissection, Open Ileal Conduit Diversion, Cysto, Indocyanine Green Dye Injection into Bladder     ROBOTIC ASSISTED LAPAROSCOPIC LYSIS OF ADHESION N/A 09/15/2012   Procedure: ROBOTIC ASSISTED LAPAROSCOPIC LYSIS OF ADHESION;  Surgeon: Alexis Frock, MD;   Location: [facility];  Service: Urology;  Laterality: N/A;       No family history on file.  Social History   Tobacco Use   Smoking status: Never   Smokeless tobacco: Never  Substance Use Topics   Alcohol use: Yes    Comment: 2-3 oz of liquour daily   Drug use: No    Home Medications Prior to Admission medications   Medication Sig Start Date End Date Taking? Authorizing Provider  HYDROcodone-acetaminophen (NORCO/VICODIN) 5-325 MG tablet Take 1 tablet by mouth every 6 (six) hours as needed for moderate pain. 12/30/20  Yes Fredia Sorrow, MD  LORazepam (ATIVAN) 1 MG tablet Take 1 tablet by mouth every 8 (eight) hours. 03/18/17  Yes [provider]  ondansetron (ZOFRAN ODT) 4 MG disintegrating tablet Take 1 tablet (4 mg total) by mouth every 8 (eight) hours as needed for nausea or vomiting. 12/30/20  Yes Fredia Sorrow, MD  telmisartan (MICARDIS) 80 MG tablet Take 80 mg by mouth at bedtime.   Yes [provider]  azithromycin (ZITHROMAX Z-PAK) 250 MG tablet Take by package instructions Patient not taking: No sig reported 04/16/18   Sinda Du, MD  cefdinir (OMNICEF) 300 MG capsule Take 1 capsule (300 mg total) by mouth 2 (two) times daily. Patient not taking: No sig reported 04/16/18   Sinda Du, MD  HYDROcodone-homatropine [facility]) 5-1.5 MG/5ML syrup Take 5 mLs by mouth every 6 (six) hours as needed for cough. Patient not taking: No sig reported 04/16/18   Sinda Du, MD  meclizine (ANTIVERT) 25 MG tablet Take 1 tablet (25 mg total) by mouth 3 (three) times daily as needed for dizziness. Patient not taking: Reported on 12/30/2020 03/18/17   Milton Ferguson, MD  ondansetron (ZOFRAN ODT) 4 MG disintegrating tablet 4mg  ODT q4 hours prn nausea/vomit Patient not taking: Reported on 12/30/2020 03/18/17   Milton Ferguson, MD  predniSONE (STERAPRED UNI-PAK 21 TAB) 10 MG (21) TBPK tablet Take by package instructions Patient not taking: No sig reported 04/16/18    Sinda Du, MD    Allergies    Percocet [oxycodone-acetaminophen]  Review of Systems   Review of Systems  Constitutional:  Negative for chills and fever.  HENT:  Negative for ear pain and sore throat.   Eyes:  Negative for pain and visual disturbance.  Respiratory:  Negative for cough and shortness of breath.   Cardiovascular:  Negative for chest pain and palpitations.  Gastrointestinal:  Positive for abdominal pain and nausea. Negative for vomiting.  Genitourinary:  Negative for dysuria and hematuria.  Musculoskeletal:  Negative for arthralgias and back pain.  Skin:  Negative for color change and rash.  Neurological:  Negative for seizures and syncope.  All other systems reviewed and are negative.  Physical Exam Updated Vital Signs BP (!) 158/79   Pulse 80   Temp 98.2 F (36.8 C) (Oral)   Resp (!) 21   Ht 1.88 m (6\' 2" )   Wt 88.5 kg   SpO2 95%   BMI 25.04 kg/m   Physical Exam Vitals and nursing note reviewed.  Constitutional:      Appearance: Normal appearance. He is well-developed.  HENT:     Head: Normocephalic and atraumatic.  Eyes:     Conjunctiva/sclera: Conjunctivae normal.  Cardiovascular:     Rate and Rhythm: Normal rate and regular rhythm.     Heart sounds: No murmur heard. Pulmonary:     Effort: Pulmonary effort is normal. No respiratory distress.     Breath sounds: Normal breath sounds.  Abdominal:     Palpations: Abdomen is soft.     Tenderness: There is no abdominal tenderness. There is no guarding.     Comments: Ilio urostomy tube with good urine flow.  No tenderness to palpation of the abdomen.  The patient subjectively says his right lower quadrant  Musculoskeletal:     Cervical back: Neck supple.  Skin:    General: Skin is warm and dry.  Neurological:     General: No focal deficit present.     Mental Status: He is alert and oriented to person, place, and time.    ED Results / Procedures / Treatments   Labs (all labs ordered are  listed, but only abnormal results are displayed) Labs Reviewed  COMPREHENSIVE METABOLIC PANEL - Abnormal; Notable for the following components:      Result Value   Sodium 132 (*)    Glucose, Bld 189 (*)    Total Protein 8.3 (*)    All other components within normal limits  CBC - Abnormal; Notable for the following components:   WBC 15.2 (*)    All other components within normal limits  URINALYSIS, ROUTINE W REFLEX MICROSCOPIC - Abnormal; Notable for the following components:   APPearance HAZY (*)    Hgb urine dipstick SMALL (*)    Ketones, ur 5 (*)    Protein, ur 100 (*)    Nitrite POSITIVE (*)    Leukocytes,Ua MODERATE (*)    WBC, UA >50 (*)    Bacteria, UA  MANY (*)    All other components within normal limits  URINE CULTURE  LIPASE, BLOOD    EKG EKG Interpretation  Date/Time:  Tuesday December 30 2020 10:20:48 EDT Ventricular Rate:  79 PR Interval:  184 QRS Duration: 91 QT Interval:  376 QTC Calculation: 431 R Axis:   13 Text Interpretation: Sinus rhythm Baseline wander in lead(s) I III aVL Confirmed by Fredia Sorrow [phone]) on 12/30/2020 10:48:58 AM  Radiology CT Abdomen Pelvis W Contrast  Result Date: 12/30/2020 CLINICAL DATA:  Right-sided abdominal pain and nausea for 3 days. Prior surgery for bladder carcinoma. EXAM: CT ABDOMEN AND PELVIS WITH CONTRAST TECHNIQUE: Multidetector CT imaging of the abdomen and pelvis was performed using the standard protocol following bolus administration of intravenous contrast. CONTRAST:  149mL OMNIPAQUE IOHEXOL 300 MG/ML  SOLN COMPARISON:  04/12/2017 from [facility] FINDINGS: Lower Chest: No acute findings. Hepatobiliary: No hepatic masses identified. Sub-cm cyst noted in the anterior right hepatic lobe. Distended gallbladder is seen with 4 mm gallstone in the gallbladder neck. Mild diffuse gallbladder wall thickening and pericholecystic soft tissue stranding are also seen, consistent with acute cholecystitis. No  evidence of biliary ductal dilatation. Pancreas:  No mass or inflammatory changes. Spleen: Within normal limits in size and appearance. Adrenals/Urinary Tract: No masses identified. Small bilateral renal cysts are noted. No evidence of ureteral calculi or hydronephrosis. Stable postop changes from cystectomy with ileal loop diversion. Stomach/Bowel: Increased size of small to moderate hiatal hernia. No evidence of obstruction, inflammatory process or abnormal fluid collections. Diverticulosis is seen mainly involving the descending and sigmoid colon, however there is no evidence of diverticulitis. 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 Vascular/Lymphatic: No pathologically enlarged lymph nodes. No acute vascular findings. Aortic atherosclerotic calcification noted. Reproductive:  Stable postop changes from cystoprostatectomy. Other: 2 small left paraumbilical ventral hernias are again seen, both containing only fat and without significant change compared to prior exam. Musculoskeletal:  No suspicious bone lesions identified. IMPRESSION: Findings consistent with acute cholecystitis. No evidence of biliary obstruction or other complication. Stable postop changes from cystoprostatectomy with ileal loop diversion. No evidence of recurrent or metastatic carcinoma. Increased size of small to moderate hiatal hernia. Stable small left paraumbilical ventral hernias, which contain only fat. Colonic diverticulosis, without radiographic evidence of diverticulitis. Aortic Atherosclerosis (ICD10-I70.0). Electronically Signed   By: Marlaine Hind M.D.   On: 12/30/2020 11:55   US Abdomen Limited RUQ (LIVER/GB)  Result Date: 12/30/2020 CLINICAL DATA:  Right upper quadrant abdominal pain and nausea for 3 days. Gallstone in the neck of the gallbladder on CT scan today. EXAM: ULTRASOUND ABDOMEN LIMITED RIGHT UPPER QUADRANT COMPARISON:  CT scan 12/30/2020 FINDINGS: Gallbladder: Mild gallbladder wall  thickening varying between 3-5 mm. There is sludge in the gallbladder but the stone seen in the neck of the gallbladder on CT is difficult to visualize on today's ultrasound. Sonographic Percell Miller sign is documented as being present. Trace pericholecystic fluid. Common bile duct: Diameter: 0.2 cm Liver: Equivocal accentuated hepatic echogenicity. No focal hepatic lesion identified. No intrahepatic biliary dilatation. Portal vein is patent on color Doppler imaging with normal direction of blood flow towards the liver. Other: None. IMPRESSION: 1. Gallbladder wall thickening with mild pericholecystic fluid and sonographic Murphy's sign present. The small stone in the neck of the gallbladder seen on CT is not as well appreciated on today's ultrasound. There is no CBD dilatation. Correlate clinically in assessing for acute cholecystitis. 2. Nonspecific and equivocal accentuated hepatic echogenicity  diffusely, without focal hepatic lesion. Electronically Signed   By: Van Clines M.D.   On: 12/30/2020 13:54    Procedures Procedures   Medications Ordered in ED Medications  ondansetron (ZOFRAN) injection 4 mg (4 mg Intravenous Given 12/30/20 1122)  iohexol (OMNIPAQUE) 300 MG/ML solution 100 mL (100 mLs Intravenous Contrast Given 12/30/20 1130)  ondansetron (ZOFRAN) injection 4 mg (4 mg Intravenous Given 12/30/20 1302)  HYDROmorphone (DILAUDID) injection 1 mg (1 mg Intravenous Given 12/30/20 1302)    ED Course  I have reviewed the triage vital signs and the nursing notes.  Pertinent labs & imaging results that were available during my care of the patient were reviewed by me and considered in my medical decision making (see chart for details).    MDM Rules/Calculators/A&P                           Will get CT scan to further evaluate.  Patient does have a leukocytosis.  Other labs still pending.  Labs significant for urine that appears abnormal.  However patient has the urostomy.  Sent for culture.   Pleat metabolic panel without significant abnormalities.  CBC shows evidence of a leukocytosis.  CT scan of the abdomen showed findings consistent with acute cholecystitis no evidence of biliary obstruction or other complication.  Also small left periumbilical ventral hernias which occasionally fat.  There is diverticulosis without evidence of diverticulitis.  Postop changes from the cystoscopy prostatectomy with ileal loop diversion no evidence of any recurrent or metastatic disease.  No evidence of any obstruction there.  Patient given some pain medicine here.  Patient now nontender in the right upper quadrant.  Did go on to get ultrasound.  The ultrasound showed gallbladder wall thickening with mild paralytic cystic fluid and a sonographic Percell Miller sign is present a small stone in the neck of the gallbladder seen on the CT scan is not well appreciated on today's ultrasound.  There is no common bile duct dilatation.  Said to assess clinically for acute cholecystitis.  Patient overall now nontender.  Feeling better.  Patient prefers to go home.  We will have him follow-up with Dr. Arnoldo Morale.  Patient will come back for any fever persistent vomiting or chills or worse abdominal pain.  We will discharge him home with some hydrocodone to take.     Final Clinical Impression(s) / ED Diagnoses Final diagnoses:  Right lower quadrant abdominal pain  Gallstones  Calculus of gallbladder without cholecystitis without obstruction    Rx / DC Orders ED Discharge Orders          Ordered    HYDROcodone-acetaminophen (NORCO/VICODIN) 5-325 MG tablet  Every 6 hours PRN        12/30/20 1508    ondansetron (ZOFRAN ODT) 4 MG disintegrating tablet  Every 8 hours PRN        12/30/20 1508             Fredia Sorrow, MD 12/30/20 1509    Fredia Sorrow, MD 12/30/20 1510

## 2021-01-01 DIAGNOSIS — W19XXXA Unspecified fall, initial encounter: Secondary | ICD-10-CM | POA: Diagnosis not present

## 2021-01-01 DIAGNOSIS — R69 Illness, unspecified: Secondary | ICD-10-CM | POA: Diagnosis not present

## 2021-01-02 ENCOUNTER — Encounter (HOSPITAL_COMMUNITY): Payer: Self-pay

## 2021-01-02 ENCOUNTER — Inpatient Hospital Stay (HOSPITAL_COMMUNITY)
Admission: EM | Admit: 2021-01-02 | Discharge: 2021-01-08 | DRG: 854 | Disposition: A | Discharge status: Home Health | Payer: Medicare Other | Attending: Family Medicine | Admitting: Family Medicine

## 2021-01-02 ENCOUNTER — Emergency Department (HOSPITAL_COMMUNITY): Payer: Medicare Other

## 2021-01-02 ENCOUNTER — Other Ambulatory Visit: Payer: Self-pay

## 2021-01-02 DIAGNOSIS — R509 Fever, unspecified: Secondary | ICD-10-CM | POA: Diagnosis not present

## 2021-01-02 DIAGNOSIS — K810 Acute cholecystitis: Secondary | ICD-10-CM | POA: Diagnosis not present

## 2021-01-02 DIAGNOSIS — R6520 Severe sepsis without septic shock: Secondary | ICD-10-CM | POA: Diagnosis not present

## 2021-01-02 DIAGNOSIS — Z8551 Personal history of malignant neoplasm of bladder: Secondary | ICD-10-CM

## 2021-01-02 DIAGNOSIS — K829 Disease of gallbladder, unspecified: Secondary | ICD-10-CM | POA: Diagnosis not present

## 2021-01-02 DIAGNOSIS — K82A1 Gangrene of gallbladder in cholecystitis: Secondary | ICD-10-CM | POA: Diagnosis present

## 2021-01-02 DIAGNOSIS — Z20822 Contact with and (suspected) exposure to covid-19: Secondary | ICD-10-CM | POA: Diagnosis present

## 2021-01-02 DIAGNOSIS — R4182 Altered mental status, unspecified: Secondary | ICD-10-CM | POA: Diagnosis not present

## 2021-01-02 DIAGNOSIS — R410 Disorientation, unspecified: Secondary | ICD-10-CM | POA: Diagnosis not present

## 2021-01-02 DIAGNOSIS — J90 Pleural effusion, not elsewhere classified: Secondary | ICD-10-CM | POA: Diagnosis not present

## 2021-01-02 DIAGNOSIS — I10 Essential (primary) hypertension: Secondary | ICD-10-CM | POA: Diagnosis present

## 2021-01-02 DIAGNOSIS — Z936 Other artificial openings of urinary tract status: Secondary | ICD-10-CM

## 2021-01-02 DIAGNOSIS — J9811 Atelectasis: Secondary | ICD-10-CM | POA: Diagnosis not present

## 2021-01-02 DIAGNOSIS — N390 Urinary tract infection, site not specified: Secondary | ICD-10-CM | POA: Diagnosis not present

## 2021-01-02 DIAGNOSIS — E876 Hypokalemia: Secondary | ICD-10-CM | POA: Diagnosis present

## 2021-01-02 DIAGNOSIS — F419 Anxiety disorder, unspecified: Secondary | ICD-10-CM | POA: Diagnosis present

## 2021-01-02 DIAGNOSIS — R000 Tachycardia, unspecified: Secondary | ICD-10-CM | POA: Diagnosis not present

## 2021-01-02 DIAGNOSIS — E869 Volume depletion, unspecified: Secondary | ICD-10-CM | POA: Diagnosis present

## 2021-01-02 DIAGNOSIS — K8000 Calculus of gallbladder with acute cholecystitis without obstruction: Secondary | ICD-10-CM | POA: Diagnosis not present

## 2021-01-02 DIAGNOSIS — E871 Hypo-osmolality and hyponatremia: Secondary | ICD-10-CM | POA: Diagnosis present

## 2021-01-02 DIAGNOSIS — I517 Cardiomegaly: Secondary | ICD-10-CM | POA: Diagnosis not present

## 2021-01-02 DIAGNOSIS — K8020 Calculus of gallbladder without cholecystitis without obstruction: Secondary | ICD-10-CM | POA: Diagnosis not present

## 2021-01-02 DIAGNOSIS — K8001 Calculus of gallbladder with acute cholecystitis with obstruction: Secondary | ICD-10-CM | POA: Diagnosis present

## 2021-01-02 DIAGNOSIS — A419 Sepsis, unspecified organism: Secondary | ICD-10-CM | POA: Diagnosis not present

## 2021-01-02 DIAGNOSIS — R0600 Dyspnea, unspecified: Secondary | ICD-10-CM

## 2021-01-02 DIAGNOSIS — I959 Hypotension, unspecified: Secondary | ICD-10-CM | POA: Diagnosis not present

## 2021-01-02 DIAGNOSIS — R112 Nausea with vomiting, unspecified: Secondary | ICD-10-CM | POA: Diagnosis not present

## 2021-01-02 DIAGNOSIS — R0902 Hypoxemia: Secondary | ICD-10-CM | POA: Diagnosis not present

## 2021-01-02 DIAGNOSIS — R0602 Shortness of breath: Secondary | ICD-10-CM | POA: Diagnosis not present

## 2021-01-02 DIAGNOSIS — R404 Transient alteration of awareness: Secondary | ICD-10-CM | POA: Diagnosis not present

## 2021-01-02 DIAGNOSIS — R1011 Right upper quadrant pain: Secondary | ICD-10-CM | POA: Diagnosis not present

## 2021-01-02 LAB — URINE CULTURE: Culture: >=100000 — AB

## 2021-01-02 LAB — COMPREHENSIVE METABOLIC PANEL
ALT: 18 U/L (ref 0–44)
AST: 15 U/L (ref 15–41)
Albumin: 2.9 g/dL — ABNORMAL LOW (ref 3.5–5.0)
Alkaline Phosphatase: 77 U/L (ref 38–126)
Anion gap: 10 (ref 5–15)
BUN: 33 mg/dL — ABNORMAL HIGH (ref 8–23)
CO2: 21 mmol/L — ABNORMAL LOW (ref 22–32)
Calcium: 8.3 mg/dL — ABNORMAL LOW (ref 8.9–10.3)
Chloride: 98 mmol/L (ref 98–111)
Creatinine, Ser: 1.42 mg/dL — ABNORMAL HIGH (ref 0.61–1.24)
GFR, Estimated: 49 mL/min — ABNORMAL LOW (ref >60)
Glucose, Bld: 160 mg/dL — ABNORMAL HIGH (ref 70–99)
Potassium: 3.7 mmol/L (ref 3.5–5.1)
Sodium: 129 mmol/L — ABNORMAL LOW (ref 135–145)
Total Bilirubin: 1.5 mg/dL — ABNORMAL HIGH (ref 0.3–1.2)
Total Protein: 6.6 g/dL (ref 6.5–8.1)

## 2021-01-02 LAB — CBC WITH DIFFERENTIAL/PLATELET
Abs Immature Granulocytes: 0.06 10*3/uL (ref 0.00–0.07)
Basophils Absolute: 0 10*3/uL (ref 0.0–0.1)
Basophils Relative: 0 %
Eosinophils Absolute: 0.1 10*3/uL (ref 0.0–0.5)
Eosinophils Relative: 1 %
HCT: 35.6 % — ABNORMAL LOW (ref 39.0–52.0)
Hemoglobin: 12.3 g/dL — ABNORMAL LOW (ref 13.0–17.0)
Immature Granulocytes: 1 %
Lymphocytes Relative: 2 %
Lymphs Abs: 0.2 10*3/uL — ABNORMAL LOW (ref 0.7–4.0)
MCH: 32.8 pg (ref 26.0–34.0)
MCHC: 34.6 g/dL (ref 30.0–36.0)
MCV: 94.9 fL (ref 80.0–100.0)
Monocytes Absolute: 1.8 10*3/uL — ABNORMAL HIGH (ref 0.1–1.0)
Monocytes Relative: 14 %
Neutro Abs: 10.4 10*3/uL — ABNORMAL HIGH (ref 1.7–7.7)
Neutrophils Relative %: 82 %
Platelets: 246 10*3/uL (ref 150–400)
RBC: 3.75 MIL/uL — ABNORMAL LOW (ref 4.22–5.81)
RDW: 13 % (ref 11.5–15.5)
WBC: 12.6 10*3/uL — ABNORMAL HIGH (ref 4.0–10.5)
nRBC: 0 % (ref 0.0–0.2)

## 2021-01-02 LAB — APTT: aPTT: 29 seconds (ref 24–36)

## 2021-01-02 LAB — RESP PANEL BY RT-PCR (FLU A&B, COVID) ARPGX2
Influenza A by PCR: NEGATIVE
Influenza B by PCR: NEGATIVE
SARS Coronavirus 2 by RT PCR: NEGATIVE

## 2021-01-02 LAB — URINALYSIS, ROUTINE W REFLEX MICROSCOPIC
Bilirubin Urine: NEGATIVE
Glucose, UA: NEGATIVE mg/dL
Hgb urine dipstick: NEGATIVE
Ketones, ur: NEGATIVE mg/dL
Nitrite: POSITIVE — AB
Protein, ur: 100 mg/dL — AB
Specific Gravity, Urine: 1.012 (ref 1.005–1.030)
pH: 9 — ABNORMAL HIGH (ref 5.0–8.0)

## 2021-01-02 LAB — LIPASE, BLOOD: Lipase: 18 U/L (ref 11–51)

## 2021-01-02 LAB — PROTIME-INR
INR: 1.1 (ref 0.8–1.2)
Prothrombin Time: 14.5 seconds (ref 11.4–15.2)

## 2021-01-02 LAB — LACTIC ACID, PLASMA: Lactic Acid, Venous: 1 mmol/L (ref 0.5–1.9)

## 2021-01-02 MED ORDER — MORPHINE SULFATE (PF) 2 MG/ML IV SOLN
2.0000 mg | INTRAVENOUS | Status: DC | PRN
  Administered 2021-01-07: 2 mg via INTRAVENOUS
  Filled 2021-01-02: qty 1

## 2021-01-02 MED ORDER — LACTATED RINGERS IV SOLN: INTRAVENOUS | Status: AC

## 2021-01-02 MED ORDER — ACETAMINOPHEN 650 MG RE SUPP
650.0000 mg | Freq: Once | RECTAL | Status: AC
  Administered 2021-01-02: 650 mg via RECTAL

## 2021-01-02 MED ORDER — SODIUM CHLORIDE 0.9 % IV SOLN: INTRAVENOUS | Status: DC

## 2021-01-02 MED ORDER — LACTATED RINGERS IV SOLN: INTRAVENOUS | Status: DC

## 2021-01-02 MED ORDER — TRAZODONE HCL 50 MG PO TABS: 50.0000 mg | ORAL_TABLET | Freq: Every evening | ORAL | Status: DC | PRN

## 2021-01-02 MED ORDER — LACTATED RINGERS IV BOLUS (SEPSIS)
1000.0000 mL | Freq: Once | INTRAVENOUS | Status: AC
  Administered 2021-01-02: 1000 mL via INTRAVENOUS

## 2021-01-02 MED ORDER — ONDANSETRON HCL 4 MG PO TABS: 4.0000 mg | ORAL_TABLET | Freq: Four times a day (QID) | ORAL | Status: DC | PRN

## 2021-01-02 MED ORDER — SODIUM CHLORIDE 0.9% FLUSH
3.0000 mL | Freq: Two times a day (BID) | INTRAVENOUS | Status: DC
  Administered 2021-01-02 – 2021-01-07 (×6): 3 mL via INTRAVENOUS

## 2021-01-02 MED ORDER — METRONIDAZOLE 500 MG/100ML IV SOLN
500.0000 mg | Freq: Once | INTRAVENOUS | Status: AC
  Administered 2021-01-02: 500 mg via INTRAVENOUS
  Filled 2021-01-02: qty 100

## 2021-01-02 MED ORDER — SODIUM CHLORIDE 0.9 % IV SOLN
2.0000 g | Freq: Once | INTRAVENOUS | Status: AC
  Administered 2021-01-02: 2 g via INTRAVENOUS
  Filled 2021-01-02: qty 2

## 2021-01-02 MED ORDER — ACETAMINOPHEN 325 MG PO TABS
650.0000 mg | ORAL_TABLET | Freq: Four times a day (QID) | ORAL | Status: DC | PRN
  Administered 2021-01-03 – 2021-01-07 (×5): 650 mg via ORAL
  Filled 2021-01-02 (×5): qty 2

## 2021-01-02 MED ORDER — METRONIDAZOLE 500 MG/100ML IV SOLN
500.0000 mg | Freq: Two times a day (BID) | INTRAVENOUS | Status: DC
Start: 2021-01-03 — End: 2021-01-08
  Administered 2021-01-03 – 2021-01-08 (×10): 500 mg via INTRAVENOUS
  Filled 2021-01-02 (×10): qty 100

## 2021-01-02 MED ORDER — IRBESARTAN 150 MG PO TABS: 300.0000 mg | ORAL_TABLET | Freq: Every day | ORAL | Status: DC

## 2021-01-02 MED ORDER — LORAZEPAM 1 MG PO TABS
1.0000 mg | ORAL_TABLET | Freq: Two times a day (BID) | ORAL | Status: DC | PRN
  Administered 2021-01-03: 1 mg via ORAL
  Filled 2021-01-02: qty 1

## 2021-01-02 MED ORDER — OXYCODONE HCL 5 MG PO TABS
5.0000 mg | ORAL_TABLET | ORAL | Status: DC | PRN
  Administered 2021-01-03: 5 mg via ORAL
  Filled 2021-01-02 (×2): qty 1

## 2021-01-02 MED ORDER — ACETAMINOPHEN 650 MG RE SUPP: 650.0000 mg | Freq: Four times a day (QID) | RECTAL | Status: DC | PRN

## 2021-01-02 MED ORDER — VANCOMYCIN HCL 1000 MG/200ML IV SOLN
1000.0000 mg | Freq: Once | INTRAVENOUS | Status: AC
  Administered 2021-01-02: 1000 mg via INTRAVENOUS

## 2021-01-02 MED ORDER — VANCOMYCIN HCL 1250 MG/250ML IV SOLN: 1250.0000 mg | INTRAVENOUS | Status: DC

## 2021-01-02 MED ORDER — HEPARIN SODIUM (PORCINE) 5000 UNIT/ML IJ SOLN
5000.0000 [IU] | Freq: Three times a day (TID) | INTRAMUSCULAR | Status: DC
  Administered 2021-01-02 – 2021-01-06 (×10): 5000 [IU] via SUBCUTANEOUS
  Filled 2021-01-02 (×11): qty 1

## 2021-01-02 MED ORDER — POLYETHYLENE GLYCOL 3350 17 G PO PACK
17.0000 g | PACK | Freq: Every day | ORAL | Status: DC | PRN
  Administered 2021-01-07: 17 g via ORAL
  Filled 2021-01-02: qty 1

## 2021-01-02 MED ORDER — SODIUM CHLORIDE 0.9 % IV SOLN
2.0000 g | Freq: Three times a day (TID) | INTRAVENOUS | Status: DC
  Administered 2021-01-03 – 2021-01-08 (×16): 2 g via INTRAVENOUS
  Filled 2021-01-02 (×16): qty 2

## 2021-01-02 MED ORDER — ONDANSETRON HCL 4 MG/2ML IJ SOLN
4.0000 mg | Freq: Four times a day (QID) | INTRAMUSCULAR | Status: DC | PRN
  Administered 2021-01-04 – 2021-01-07 (×5): 4 mg via INTRAVENOUS
  Filled 2021-01-02 (×5): qty 2

## 2021-01-02 MED ORDER — SODIUM CHLORIDE 0.9 % IV BOLUS
1000.0000 mL | Freq: Once | INTRAVENOUS | Status: AC
  Administered 2021-01-02: 1000 mL via INTRAVENOUS

## 2021-01-02 NOTE — Sepsis Progress Note (Signed)
[facility] monitoring sepsis protocol 

## 2021-01-02 NOTE — H&P (Signed)
[facility] History and Physical  Benjamin Yang VPX:[id number] DOB: Dec 01, 1935 DOA: 01/02/2021  Referring physician: Dr. Sabra Heck PCP: Monico Blitz, MD   Chief Complaint: abdominal pain  HPI: Benjamin Yang is a 85 y.o. male with hx of hypertension, bladder cancer status post robotic cystoprostatectomy with node dissection and ileal conduit urinary diversion, who presents with abdominal pain.  Patient was seen in the [facility], ED 3 days prior, at that time he noted right-sided abdominal pain for several days.  He underwent a CT scan that showed findings concerning for acute cholecystitis.  He improved while in the ED, and plan was made for him to follow-up outpatient with general surgery (Dr. Arnoldo Morale) and he was discharged home.  Today patient reports he has been having intermittent right upper quadrant pain since he left.  Prior to coming to the hospital he had a meal of fried fish, cake, and a shot of whiskey which induced intense right upper quadrant pain that would bring him to his knees and radiated down his right side.  Presently he feels much better and his pain is mostly gone.  He is wondering if he may have peritonitis related to his urostomy.  He endorses being feverish earlier but denies feeling feverish at present, no nausea or vomiting presently.  In the ED initial vital signs notable for fever to 103.9 Fahrenheit, and increased respirations in the mid 20s.  Lab work showed white count of 12.6 which was mildly improved from 15.2 three days prior, remainder of CBC unremarkable.  CMP showed mild hyponatremia to 129, creatinine of 1.4 up from 1.0 three days prior, AST/ALT were normal, T bili was elevated at 1.5 and had been 1.1 at prior ED visit.  UA showed positive nitrite and leuks, however this has consistently been the case reviewing prior results.  Lactic acid was normal.  COVID and influenza respiratory panel was negative.  Chest x-ray with no obvious consolidations, CT head  without acute findings, and right upper quadrant ultrasound showed ongoing findings concerning for acute cholecystitis.  Patient was started on cefepime, Flagyl, vancomycin, given IV fluid resuscitation, and admitted for further management.  In addition Dr. Arnoldo Morale from general surgery evaluated the patient, not totally convinced of diagnosis of cholecystitis and recommended ongoing work-up for etiology of fever and no need for urgent surgical intervention.  Review of Systems:  Pertinent positives and negative per HPI, all others reviewed and negative  Past Medical History:  Diagnosis Date   Cancer [facility])    bladder   Hypertension    Prostatitis    Past Surgical History:  Procedure Laterality Date   BACK SURGERY     CYSTOSCOPY N/A 09/15/2012   Procedure: CYSTOSCOPY;  Surgeon: Alexis Frock, MD;  Location: [facility];  Service: Urology;  Laterality: N/A;   LYMPHADENECTOMY Bilateral 09/15/2012   Procedure:  Bilateral Pelvic Lymph Node Dissection;  Surgeon: Alexis Frock, MD;  Location: [facility];  Service: Urology;  Laterality: Bilateral;   ROBOT ASSISTED LAPAROSCOPIC COMPLETE CYSTECT ILEAL CONDUIT N/A 09/15/2012   Procedure: Robotic Cystoprostatectomy, Open Ileal Conduit Diversion,  Indocyanine Green Dye Injection into Bladder ;  Surgeon: Alexis Frock, MD;  Location: [facility];  Service: Urology;  Laterality: N/A;  Robotic Cystoprostatectomy, Bilateral Pelvic Lymph Node Dissection, Open Ileal Conduit Diversion, Cysto, Indocyanine Green Dye Injection into Bladder     ROBOTIC ASSISTED LAPAROSCOPIC LYSIS OF ADHESION N/A 09/15/2012   Procedure: ROBOTIC ASSISTED LAPAROSCOPIC LYSIS OF ADHESION;  Surgeon: Alexis Frock, MD;  Location: [facility];  Service: Urology;  Laterality: N/A;   Social History:  reports that he has never smoked. He has never used smokeless tobacco. He reports current alcohol use. He reports that he does not use drugs.  Allergies  Allergen Reactions   Percocet [Oxycodone-Acetaminophen]      Patient says he can take this for pain-just bothered his stomach a little bit before    History reviewed. No pertinent family history.   Prior to Admission medications   Medication Sig Start Date End Date Taking? Authorizing Provider  ibuprofen (ADVIL) 200 MG tablet Take 600 mg by mouth every 6 (six) hours as needed.   Yes [provider]  LORazepam (ATIVAN) 1 MG tablet Take 1 tablet by mouth 2 (two) times daily as needed. 03/18/17  Yes [provider]  telmisartan (MICARDIS) 80 MG tablet Take 80 mg by mouth daily.   Yes [provider]  azithromycin (ZITHROMAX Z-PAK) 250 MG tablet Take by package instructions Patient not taking: No sig reported 04/16/18   Sinda Du, MD  cefdinir (OMNICEF) 300 MG capsule Take 1 capsule (300 mg total) by mouth 2 (two) times daily. Patient not taking: No sig reported 04/16/18   Sinda Du, MD  HYDROcodone-acetaminophen (NORCO/VICODIN) 5-325 MG tablet Take 1 tablet by mouth every 6 (six) hours as needed for moderate pain. Patient not taking: No sig reported 12/30/20   Fredia Sorrow, MD  HYDROcodone-homatropine [facility]) 5-1.5 MG/5ML syrup Take 5 mLs by mouth every 6 (six) hours as needed for cough. Patient not taking: No sig reported 04/16/18   Sinda Du, MD  meclizine (ANTIVERT) 25 MG tablet Take 1 tablet (25 mg total) by mouth 3 (three) times daily as needed for dizziness. Patient not taking: No sig reported 03/18/17   Milton Ferguson, MD  ondansetron (ZOFRAN ODT) 4 MG disintegrating tablet 4mg  ODT q4 hours prn nausea/vomit Patient not taking: No sig reported 03/18/17   Milton Ferguson, MD  ondansetron (ZOFRAN ODT) 4 MG disintegrating tablet Take 1 tablet (4 mg total) by mouth every 8 (eight) hours as needed for nausea or vomiting. Patient not taking: No sig reported 12/30/20   Fredia Sorrow, MD  predniSONE (STERAPRED UNI-PAK 21 TAB) 10 MG (21) TBPK tablet Take by package instructions Patient not taking: No sig  reported 04/16/18   Sinda Du, MD   Physical Exam: Vitals:   01/02/21 1434 01/02/21 1435 01/02/21 1700 01/02/21 1732  BP: (!) 117/55  110/60   Pulse: (!) 101  86   Resp: (!) 28  (!) 25   Temp: (!) 103.9 F (39.9 C)   99.7 F (37.6 C)  TempSrc: Rectal   Rectal  SpO2: 94%  91%   Weight:  88.5 kg    Height:  6\' 2"  (1.88 m)      Wt Readings from Last 3 Encounters:  01/02/21 88.5 kg  12/30/20 88.5 kg  04/16/18 97.1 kg     General: Appears calm and comfortable Eyes: No scleral icterus ENT: grossly normal hearing, lips & tongue, dry mucous membranes Neck: no masses Cardiovascular: RRR, no m/r/g. No LE edema. Respiratory: CTA bilaterally, no w/r/r. Normal respiratory effort. Abdomen: soft, mild tenderness to palpation in the right upper quadrant with negative Murphy sign, remainder of abdomen nontender to palpation, no rebound or guarding Skin: no rash or induration seen on limited exam Musculoskeletal: grossly normal tone BUE/BLE Psychiatric: grossly normal mood and affect, speech fluent and appropriate.  A&O x4 Neurologic: grossly non-focal.  Labs on Admission:  Basic Metabolic Panel: Recent Labs  Lab 12/30/20 1030 01/02/21 1454  NA 132* 129*  K 3.9 3.7  CL 98 98  CO2 23 21*  GLUCOSE 189* 160*  BUN 14 33*  CREATININE 1.02 1.42*  CALCIUM 9.1 8.3*   Liver Function Tests: Recent Labs  Lab 12/30/20 1030 01/02/21 1454  AST 16 15  ALT 18 18  ALKPHOS 70 77  BILITOT 1.1 1.5*  PROT 8.3* 6.6  ALBUMIN 4.3 2.9*   Recent Labs  Lab 12/30/20 1030 01/02/21 1454  LIPASE 23 18   No results for input(s): AMMONIA in the last 168 hours. CBC: Recent Labs  Lab 12/30/20 1030 01/02/21 1454  WBC 15.2* 12.6*  NEUTROABS  --  10.4*  HGB 15.2 12.3*  HCT 44.2 35.6*  MCV 95.3 94.9  PLT 272 246   Cardiac Enzymes: No results for input(s): CKTOTAL, CKMB, CKMBINDEX, TROPONINI in the last 168 hours.  BNP (last 3 results) No results for input(s): BNP in the  last 8760 hours.  ProBNP (last 3 results) No results for input(s): PROBNP in the last 8760 hours.  CBG: No results for input(s): GLUCAP in the last 168 hours.  Radiological Exams on Admission: CT HEAD WO CONTRAST  Result Date: 01/02/2021 CLINICAL DATA:  Mental status change, unknown cause EXAM: CT HEAD WITHOUT CONTRAST TECHNIQUE: Contiguous axial images were obtained from the base of the skull through the vertex without intravenous contrast. COMPARISON:  2013 FINDINGS: Brain: There is no acute intracranial hemorrhage, mass effect, or edema. Gray-white differentiation is preserved. There is no extra-axial fluid collection. Prominence of the ventricles and sulci reflects parenchymal volume loss. Patchy hypoattenuation in the supratentorial white matter is nonspecific but probably reflects moderate chronic microvascular ischemic changes. Vascular: There is atherosclerotic calcification at the skull base. Skull: Calvarium is unremarkable. Sinuses/Orbits: No acute finding. Other: None. IMPRESSION: No acute intracranial abnormality. Chronic microvascular ischemic changes. Electronically Signed   By: Macy Mis M.D.   On: 01/02/2021 15:34   DG Chest Port 1 View  Result Date: 01/02/2021 CLINICAL DATA:  Altered mental status, confusion, lethargy EXAM: PORTABLE CHEST 1 VIEW COMPARISON:  04/14/2018 FINDINGS: Lower lung volumes and elevated right hemidiaphragm. Increased streaky bibasilar opacities favored to be atelectasis, less likely pneumonia. Pleural thickening on the right. No large effusion or pneumothorax. Trachea midline. Mild cardiac enlargement without CHF. Bones are osteopenic. No acute osseous finding. Aorta atherosclerotic. IMPRESSION: Lower lung volumes with elevated right hemidiaphragm. Associated increased basilar atelectasis. Electronically Signed   By: Jerilynn Mages.  Shick M.D.   On: 01/02/2021 15:02   US Abdomen Limited RUQ (LIVER/GB)  Result Date: 01/02/2021 CLINICAL DATA:  Questionable  gallstones on CT EXAM: ULTRASOUND ABDOMEN LIMITED RIGHT UPPER QUADRANT COMPARISON:  Ultrasound 12/30/2020, CT 12/30/2020 FINDINGS: Gallbladder: The gallbladder is borderline dilated with prominent intraluminal sludge. There are areas of gallbladder wall thickening. The stone seen on recent CT is not visualized sonographically. Common bile duct: Diameter: 4.8 mm Liver: No focal lesion identified. Within normal limits in parenchymal echogenicity. Portal vein is patent on color Doppler imaging with normal direction of blood flow towards the liver. Other: None. IMPRESSION: Borderline dilated gallbladder with prominent intraluminal sludge and areas of gallbladder wall thickening. Gallstone seen on recent CT is not well seen sonographically. No common bile duct dilation. Given appearance on recent CT and ultrasound 3 days ago, findings are again concerning for acute cholecystitis. Correlate with exam and laboratory findings. Electronically Signed   By: Ileene Patrick.D.  On: 01/02/2021 15:41    EKG: Independently reviewed.  Sinus, borderline PR interval, no significant ischemic changes.  Compared to prior no significant changes.  Assessment/Plan Active Problems:   Essential hypertension   Acute cholecystitis   Benjamin Yang is a 85 y.o. male with hx of hypertension, bladder cancer status post robotic cystoprostatectomy with node dissection and ileal conduit urinary diversion, who presents with abdominal pain, fever, and possible acute cholecystitis.  #Fever #Imaging findings c/f acute cholecystitis #Hyperbilirubinemia At this point based on history, physical exam, lab findings, and imaging findings, cholecystitis remains most likely on the differential.  Urinary tract infection is a possibility given his urostomy.  Chest x-ray is unremarkable, COVID and influenza swabs are negative.  Reportedly also with some confusion and possible delirium per ED signout however on my examination patient is ANO x4 no  signs of confusion. - Appreciate general surgery consultation, follow-up on recommendations - Continue cefepime and Flagyl given most likely sources are biliary and urinary, will de-escalate and remove vancomycin - Per general surgery okay for diet, low-fat ordered - Continue LR at 125 cc an hour  #Chronic medical problems Hypertension-continue ARB  Anxiety-continue lorazepam 1 mg twice daily as needed  Code Status: Full code DVT Prophylaxis: Heparin 5K every 8 hours, SCDs Family Communication: None Disposition Plan: Inpatient, MedSurg  Time spent: 50 min  Clarnce Flock MD/MP[facility]  Note:  This document was prepared using Systems analyst and may include unintentional dictation errors.

## 2021-01-02 NOTE — ED Triage Notes (Signed)
Dr. Vanita Panda in room to assess patient on arrival.

## 2021-01-02 NOTE — Progress Notes (Signed)
Pharmacy Antibiotic Note  Benjamin Yang is a 85 y.o. male admitted on 01/02/2021 with sepsis.  Pharmacy has been consulted for Vancomycin and Cefepime dosing.  Plan: Cefepime 2gm IV q8hrs Vancomycin 1250 mg IV Q 18 hrs. Goal AUC 400-550. Expected AUC: 481 SCr used: 1.02, predicted Css min = 12.8   Height: 6\' 2"  (188 cm) Weight: 88.5 kg (195 lb 1.7 oz) IBW/kg (Calculated) : 82.2  Temp (24hrs), Avg:103.9 F (39.9 C), Min:103.9 F (39.9 C), Max:103.9 F (39.9 C)  Recent Labs  Lab 12/30/20 1030 01/02/21 1453 01/02/21 1454  WBC 15.2*  --  12.6*  CREATININE 1.02  --   --   LATICACIDVEN  --  1.0  --     Estimated Creatinine Clearance: 63.8 mL/min (by C-G formula based on SCr of 1.02 mg/dL).    Allergies  Allergen Reactions   Percocet [Oxycodone-Acetaminophen]     Patient says he can take this for pain-just bothered his stomach a little bit before    Antimicrobials this admission: Vancomycin 11/4 >>  Cefepime  11/4 >> Flagyl 11/4 x 1  Dose adjustments this admission:   Microbiology results:  BCx: pending  UCx: pending   Sputum: pending   MRSA PCR: pending  Thank you for allowing pharmacy to be a part of this patient's care.  Hart Robinsons A 01/02/2021 4:05 PM

## 2021-01-02 NOTE — ED Triage Notes (Signed)
Patient from home with AMS. Per wife he has become progressively confused and lethargic. Recently seen for issues with gallbladder. Patient confused at this time.

## 2021-01-02 NOTE — ED Provider Notes (Signed)
[facility] EMERGENCY DEPARTMENT Provider Note   CSN: [id number] Arrival date & time: 01/02/21  1419     History Chief Complaint  Patient presents with   Altered Mental Status    Benjamin Yang is a 85 y.o. male.  HPI Patient presents via EMS from home with concern for delirium.  He is initially accompanied only by EMS providers, but eventually his wife joins him.  She notes that over the past 2 days or so the patient has been confused.  No report of fever, fall, vomiting.  EMS reports the patient felt warm to the touch, was confused in route.  He has multiple medical issues in his history, including prior sepsis, pneumonia, urinary tract infection, and bladder cancer with urostomy.  Level 5 caveat secondary to altered mental status/acuity of condition.    Past Medical History:  Diagnosis Date   Cancer [facility])    bladder   Hypertension    Prostatitis     Patient Active Problem List   Diagnosis Date Noted   Sepsis ([place]) 04/13/2018   Lobar pneumonia ([place]) 04/13/2018   UTI (urinary tract infection) 04/13/2018   Essential hypertension 04/13/2018    Past Surgical History:  Procedure Laterality Date   BACK SURGERY     CYSTOSCOPY N/A 09/15/2012   Procedure: CYSTOSCOPY;  Surgeon: Alexis Frock, MD;  Location: [facility];  Service: Urology;  Laterality: N/A;   LYMPHADENECTOMY Bilateral 09/15/2012   Procedure:  Bilateral Pelvic Lymph Node Dissection;  Surgeon: Alexis Frock, MD;  Location: [facility];  Service: Urology;  Laterality: Bilateral;   ROBOT ASSISTED LAPAROSCOPIC COMPLETE CYSTECT ILEAL CONDUIT N/A 09/15/2012   Procedure: Robotic Cystoprostatectomy, Open Ileal Conduit Diversion,  Indocyanine Green Dye Injection into Bladder ;  Surgeon: Alexis Frock, MD;  Location: [facility];  Service: Urology;  Laterality: N/A;  Robotic Cystoprostatectomy, Bilateral Pelvic Lymph Node Dissection, Open Ileal Conduit Diversion, Cysto, Indocyanine Green Dye Injection into Bladder     ROBOTIC ASSISTED  LAPAROSCOPIC LYSIS OF ADHESION N/A 09/15/2012   Procedure: ROBOTIC ASSISTED LAPAROSCOPIC LYSIS OF ADHESION;  Surgeon: Alexis Frock, MD;  Location: [facility];  Service: Urology;  Laterality: N/A;       No family history on file.  Social History   Tobacco Use   Smoking status: Never   Smokeless tobacco: Never  Substance Use Topics   Alcohol use: Yes    Comment: 2-3 oz of liquour daily   Drug use: No    Home Medications Prior to Admission medications   Medication Sig Start Date End Date Taking? Authorizing Provider  azithromycin (ZITHROMAX Z-PAK) 250 MG tablet Take by package instructions Patient not taking: No sig reported 04/16/18   Sinda Du, MD  cefdinir (OMNICEF) 300 MG capsule Take 1 capsule (300 mg total) by mouth 2 (two) times daily. Patient not taking: No sig reported 04/16/18   Sinda Du, MD  HYDROcodone-acetaminophen (NORCO/VICODIN) 5-325 MG tablet Take 1 tablet by mouth every 6 (six) hours as needed for moderate pain. 12/30/20   Fredia Sorrow, MD  HYDROcodone-homatropine [facility]) 5-1.5 MG/5ML syrup Take 5 mLs by mouth every 6 (six) hours as needed for cough. Patient not taking: No sig reported 04/16/18   Sinda Du, MD  LORazepam (ATIVAN) 1 MG tablet Take 1 tablet by mouth every 8 (eight) hours. 03/18/17   [provider]  meclizine (ANTIVERT) 25 MG tablet Take 1 tablet (25 mg total) by mouth 3 (three) times daily as needed for dizziness. Patient not taking: Reported on 12/30/2020 03/18/17  Milton Ferguson, MD  ondansetron (ZOFRAN ODT) 4 MG disintegrating tablet 4mg  ODT q4 hours prn nausea/vomit Patient not taking: Reported on 12/30/2020 03/18/17   Milton Ferguson, MD  ondansetron (ZOFRAN ODT) 4 MG disintegrating tablet Take 1 tablet (4 mg total) by mouth every 8 (eight) hours as needed for nausea or vomiting. 12/30/20   Fredia Sorrow, MD  predniSONE (STERAPRED UNI-PAK 21 TAB) 10 MG (21) TBPK tablet Take by package instructions Patient not taking: No  sig reported 04/16/18   Sinda Du, MD  telmisartan (MICARDIS) 80 MG tablet Take 80 mg by mouth at bedtime.    [provider]    Allergies    Percocet [oxycodone-acetaminophen]  Review of Systems   Review of Systems  Unable to perform ROS: Acuity of condition   Physical Exam Updated Vital Signs There were no vitals taken for this visit.  Physical Exam Vitals and nursing note reviewed.  Constitutional:      Appearance: He is well-developed. He is ill-appearing and diaphoretic.     Comments: Large adult male warm to the touch, withdrawn, but with prompting does respond to verbal stimuli  HENT:     Head: Normocephalic and atraumatic.  Eyes:     Conjunctiva/sclera: Conjunctivae normal.  Cardiovascular:     Rate and Rhythm: Regular rhythm. Tachycardia present.  Pulmonary:     Effort: Pulmonary effort is normal. No respiratory distress.     Breath sounds: No stridor.  Abdominal:     General: There is no distension.     Tenderness: There is no abdominal tenderness. There is no guarding.     Comments: Urostomy bag right abdomen, no tenderness around it, no obvious erythema around the collection bag.  Skin:    General: Skin is warm.  Neurological:     Comments: Patient responds to stimuli, but soon thereafter is withdrawn, noninteractive.  He does move all extremities spontaneously and minimally to command.  Psychiatric:        Cognition and Memory: Cognition is impaired. Memory is impaired.    ED Results / Procedures / Treatments   Labs (all labs ordered are listed, but only abnormal results are displayed) Labs Reviewed  RESP PANEL BY RT-PCR (FLU A&B, COVID) [id number]  URINALYSIS, ROUTINE W REFLEX MICROSCOPIC  LACTIC ACID, PLASMA  CBC WITH DIFFERENTIAL/PLATELET  COMPREHENSIVE METABOLIC PANEL    EKG None  Radiology No results found.  Procedures Procedures   Medications Ordered in ED Medications  sodium chloride 0.9 % bolus 1,000 mL (has no  administration in time range)    And  0.9 %  sodium chloride infusion (has no administration in time range)    ED Course  I have reviewed the triage vital signs and the nursing notes.  Pertinent labs & imaging results that were available during my care of the patient were reviewed by me and considered in my medical decision making (see chart for details). Review after initial evaluation notable for eval 3 days ago with initial CT suggesting acute cholecystitis with leukocytosis, though with improved clinical condition the patient went home.  It is unclear if the patient has had follow-up since that point, but unlikely given the patient's duration of 2 days of confusion. After initial evaluation with consideration of confusion, possible cholecystitis patient had ultrasound, CT, x-ray, labs, urinalysis all ordered. Patient received fluid resuscitation, was placed on continuous cardiac monitoring, pulse oximetry.  Initial labs, vitals all concerning for sepsis, patient was designated as such.  On reviewing his chart is clear  the patient had CT, ultrasound performed 3 days ago.  Initial findings concerning for acute cholecystitis, with leukocytosis.  Patient's ultrasound had findings concerning for hepatobiliary dysfunction as well.  Patient seemingly requested discharge. MDM Rules/Calculators/A&P Adult male presents febrile, tachycardic, tachypneic, altered, meets sepsis criteria.  Early suspicion for intra-abdominal source given his recent evaluation with findings concerning for acute cholecystitis.  Patient's oxygen levels are reasonable, lower suspicion for pneumonia.  Patient had labs, x-ray, CT, ultrasound ordered, on signout these are pending, anticipate admission given concern for sepsis with ongoing antibiotic therapy. Final Clinical Impression(s) / ED Diagnoses Final diagnoses:  Confusion  Severe sepsis ([place])  MDM Number of Diagnoses or Management Options Confusion: new, needed  workup Severe sepsis ([place]): new, needed workup   Amount and/or Complexity of Data Reviewed Clinical lab tests: ordered and reviewed Tests in the radiology section of CPT: ordered and reviewed Tests in the medicine section of CPT: reviewed and ordered Decide to obtain previous medical records or to obtain history from someone other than the patient: yes Obtain history from someone other than the patient: yes Review and summarize past medical records: yes Discuss the patient with other providers: yes Independent visualization of images, tracings, or specimens: yes  Risk of Complications, Morbidity, and/or Mortality Presenting problems: high Diagnostic procedures: high Management options: high  Critical Care Total time providing critical care: 30-74 minutes (45)  Patient Progress Patient progress: other (comment) (guarded)     Carmin Muskrat, MD 01/02/21 1529

## 2021-01-02 NOTE — Consult Note (Signed)
Reason for Consult: Lightheadedness, abdominal pain Referring Physician: Dr. Teressa Yang is an 85 y.o. male.  HPI: Patient is an 85 year old white male who was seen earlier in the week in the emergency room for suspected cholecystitis who felt better and was discharged home.  Over the past few days, he has had episodes of falling.  He also was trying to lift something heavy and developed right lower quadrant sharp abdominal pain.  A family member had him come back to the emergency room.  He states he has had no nausea or vomiting.  He denies any right upper quadrant abdominal pain.  He is more tender around his ileal conduit bag.  He denies any fever or chills.  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   Hypertension    Prostatitis     Past Surgical History:  Procedure Laterality Date   BACK SURGERY     CYSTOSCOPY N/A 09/15/2012   Procedure: CYSTOSCOPY;  Surgeon: Alexis Frock, MD;  Location: [facility];  Service: Urology;  Laterality: N/A;   LYMPHADENECTOMY Bilateral 09/15/2012   Procedure:  Bilateral Pelvic Lymph Node Dissection;  Surgeon: Alexis Frock, MD;  Location: [facility];  Service: Urology;  Laterality: Bilateral;   ROBOT ASSISTED LAPAROSCOPIC COMPLETE CYSTECT ILEAL CONDUIT N/A 09/15/2012   Procedure: Robotic Cystoprostatectomy, Open Ileal Conduit Diversion,  Indocyanine Green Dye Injection into Bladder ;  Surgeon: Alexis Frock, MD;  Location: [facility];  Service: Urology;  Laterality: N/A;  Robotic Cystoprostatectomy, Bilateral Pelvic Lymph Node Dissection, Open Ileal Conduit Diversion, Cysto, Indocyanine Green Dye Injection into Bladder     ROBOTIC ASSISTED LAPAROSCOPIC LYSIS OF ADHESION N/A 09/15/2012   Procedure: ROBOTIC ASSISTED LAPAROSCOPIC LYSIS OF ADHESION;  Surgeon: Alexis Frock, MD;  Location: [facility];  Service: Urology;  Laterality: N/A;    History reviewed. No pertinent family history.  Social History:  reports that he has never smoked.  He has never used smokeless tobacco. He reports current alcohol use. He reports that he does not use drugs.  Allergies:  Allergies  Allergen Reactions   Percocet [Oxycodone-Acetaminophen]     Patient says he can take this for pain-just bothered his stomach a little bit before    Medications: I have reviewed the patient's current medications. Prior to Admission: (Not in a hospital admission)   Results for orders placed or performed during the hospital encounter of 01/02/21 (from the past 48 hour(s))  Lactic acid, plasma     Status: None   Collection Time: 01/02/21  2:53 PM  Result Value Ref Range   Lactic Acid, Venous 1.0 0.5 - 1.9 mmol/L    Comment: Performed at [facility], [address] [place], [place] [address]  Urinalysis, Routine w reflex microscopic Urine, Clean Catch     Status: Abnormal   Collection Time: 01/02/21  2:54 PM  Result Value Ref Range   Color, Urine YELLOW YELLOW   APPearance HAZY (A) CLEAR   Specific Gravity, Urine 1.012 1.005 - 1.030   pH 9.0 (H) 5.0 - 8.0   Glucose, UA NEGATIVE NEGATIVE mg/dL   Hgb urine dipstick NEGATIVE NEGATIVE   Bilirubin Urine NEGATIVE NEGATIVE   Ketones, ur NEGATIVE NEGATIVE mg/dL   Protein, ur 100 (A) NEGATIVE mg/dL   Nitrite POSITIVE (A) NEGATIVE   Leukocytes,Ua TRACE (A) NEGATIVE   RBC / HPF 0-5 0 - 5 RBC/hpf   WBC, UA 11-20 0 - 5 WBC/hpf   Bacteria, UA RARE (A) NONE  SEEN   Squamous Epithelial / LPF 0-5 0 - 5   Mucus PRESENT    Ca Oxalate Crys, UA PRESENT     Comment: Performed at [facility], [address], [place], [place] [address]  CBC WITH DIFFERENTIAL     Status: Abnormal   Collection Time: 01/02/21  2:54 PM  Result Value Ref Range   WBC 12.6 (H) 4.0 - 10.5 K/uL   RBC 3.75 (L) 4.22 - 5.81 MIL/uL   Hemoglobin 12.3 (L) 13.0 - 17.0 g/dL   HCT 35.6 (L) 39.0 - 52.0 %   MCV 94.9 80.0 - 100.0 fL   MCH 32.8 26.0 - 34.0 pg   MCHC 34.6 30.0 - 36.0 g/dL   RDW 13.0 11.5 - 15.5 %   Platelets 246 150 - 400 K/uL   nRBC 0.0  0.0 - 0.2 %   Neutrophils Relative % 82 %   Neutro Abs 10.4 (H) 1.7 - 7.7 K/uL   Lymphocytes Relative 2 %   Lymphs Abs 0.2 (L) 0.7 - 4.0 K/uL   Monocytes Relative 14 %   Monocytes Absolute 1.8 (H) 0.1 - 1.0 K/uL   Eosinophils Relative 1 %   Eosinophils Absolute 0.1 0.0 - 0.5 K/uL   Basophils Relative 0 %   Basophils Absolute 0.0 0.0 - 0.1 K/uL   Immature Granulocytes 1 %   Abs Immature Granulocytes 0.06 0.00 - 0.07 K/uL    Comment: Performed at [facility], [address], [place], [place] [address]  Comprehensive metabolic panel     Status: Abnormal   Collection Time: 01/02/21  2:54 PM  Result Value Ref Range   Sodium 129 (L) 135 - 145 mmol/L   Potassium 3.7 3.5 - 5.1 mmol/L   Chloride 98 98 - 111 mmol/L   CO2 21 (L) 22 - 32 mmol/L   Glucose, Bld 160 (H) 70 - 99 mg/dL    Comment: Glucose reference range applies only to samples taken after fasting for at least 8 hours.   BUN 33 (H) 8 - 23 mg/dL   Creatinine, Ser 1.42 (H) 0.61 - 1.24 mg/dL   Calcium 8.3 (L) 8.9 - 10.3 mg/dL   Total Protein 6.6 6.5 - 8.1 g/dL   Albumin 2.9 (L) 3.5 - 5.0 g/dL   AST 15 15 - 41 U/L   ALT 18 0 - 44 U/L   Alkaline Phosphatase 77 38 - 126 U/L   Total Bilirubin 1.5 (H) 0.3 - 1.2 mg/dL   GFR, Estimated 49 (L) >60 mL/min    Comment: (NOTE) Calculated using the CKD-EPI Creatinine Equation (2021)    Anion gap 10 5 - 15    Comment: Performed at [facility], [address] [place], [place] [address]  Resp Panel by RT-PCR (Flu A&B, Covid) Urine, Clean Catch     Status: None   Collection Time: 01/02/21  2:54 PM   Specimen: Urine, Clean Catch; Nasopharyngeal(NP) swabs in vial transport medium  Result Value Ref Range   SARS Coronavirus 2 by RT PCR NEGATIVE NEGATIVE    Comment: (NOTE) SARS-CoV-2 target nucleic acids are NOT DETECTED.  The SARS-CoV-2 RNA is generally detectable in upper respiratory specimens during the acute phase of infection. The lowest concentration of SARS-CoV-2 viral copies this  assay can detect is 138 copies/mL. A negative result does not preclude SARS-Cov-2 infection and should not be used as the sole basis for treatment or other patient management decisions. A negative result may occur with  improper specimen collection/handling, submission of  specimen other than nasopharyngeal swab, presence of viral mutation(s) within the areas targeted by this assay, and inadequate number of viral copies(<138 copies/mL). A negative result must be combined with clinical observations, patient history, and epidemiological information. The expected result is Negative.  Fact Sheet for Patients:  EntrepreneurPulse.com.au  Fact Sheet for Healthcare Providers:  IncredibleEmployment.be  This test is no t yet approved or cleared by the [place] FDA and  has been authorized for detection and/or diagnosis of SARS-CoV-2 by FDA under an Emergency Use Authorization (EUA). This EUA will remain  in effect (meaning this test can be used) for the duration of the COVID-19 declaration under Section 564(b)(1) of the Act, 21 U.S.C.section 360bbb-3(b)(1), unless the authorization is terminated  or revoked sooner.       Influenza A by PCR NEGATIVE NEGATIVE   Influenza B by PCR NEGATIVE NEGATIVE    Comment: (NOTE) The Xpert Xpress SARS-CoV-2/FLU/RSV plus assay is intended as an aid in the diagnosis of influenza from Nasopharyngeal swab specimens and should not be used as a sole basis for treatment. Nasal washings and aspirates are unacceptable for Xpert Xpress SARS-CoV-2/FLU/RSV testing.  Fact Sheet for Patients: EntrepreneurPulse.com.au  Fact Sheet for Healthcare Providers: IncredibleEmployment.be  This test is not yet approved or cleared by the [place] FDA and has been authorized for detection and/or diagnosis of SARS-CoV-2 by FDA under an Emergency Use Authorization (EUA). This EUA will remain in  effect (meaning this test can be used) for the duration of the COVID-19 declaration under Section 564(b)(1) of the Act, 21 U.S.C. section 360bbb-3(b)(1), unless the authorization is terminated or revoked.  Performed at [facility], [address], [place], [place] [address]   Lipase, blood     Status: None   Collection Time: 01/02/21  2:54 PM  Result Value Ref Range   Lipase 18 11 - 51 U/L    Comment: Performed at [facility], [address], [place], [place] [address]  Blood Culture (routine x 2)     Status: None (Preliminary result)   Collection Time: 01/02/21  3:45 PM   Specimen: BLOOD RIGHT ARM  Result Value Ref Range   Specimen Description BLOOD RIGHT ARM    Special Requests      Blood Culture adequate volume BOTTLES DRAWN AEROBIC AND ANAEROBIC Performed at [facility], [address] [place], [place] [address]    Culture PENDING    Report Status PENDING   Blood Culture (routine x 2)     Status: None (Preliminary result)   Collection Time: 01/02/21  3:49 PM   Specimen: Right Antecubital; Blood  Result Value Ref Range   Specimen Description RIGHT ANTECUBITAL    Special Requests      Blood Culture results may not be optimal due to an excessive volume of blood received in culture bottles BOTTLES DRAWN AEROBIC AND ANAEROBIC Performed at [facility], [address], [place], [place] [address]    Culture PENDING    Report Status PENDING   Protime-INR     Status: None   Collection Time: 01/02/21  3:50 PM  Result Value Ref Range   Prothrombin Time 14.5 11.4 - 15.2 seconds   INR 1.1 0.8 - 1.2    Comment: (NOTE) INR goal varies based on device and disease states. Performed at [facility], [address], [place], [place] [address]   APTT     Status: None   Collection Time: 01/02/21  3:50 PM  Result Value Ref Range   aPTT  29 24 - 36 seconds    Comment: Performed at [facility], [address], [place], [place] [address]    CT HEAD WO CONTRAST  Result Date:  01/02/2021 CLINICAL DATA:  Mental status change, unknown cause EXAM: CT HEAD WITHOUT CONTRAST TECHNIQUE: Contiguous axial images were obtained from the base of the skull through the vertex without intravenous contrast. COMPARISON:  2013 FINDINGS: Brain: There is no acute intracranial hemorrhage, mass effect, or edema. Gray-white differentiation is preserved. There is no extra-axial fluid collection. Prominence of the ventricles and sulci reflects parenchymal volume loss. Patchy hypoattenuation in the supratentorial white matter is nonspecific but probably reflects moderate chronic microvascular ischemic changes. Vascular: There is atherosclerotic calcification at the skull base. Skull: Calvarium is unremarkable. Sinuses/Orbits: No acute finding. Other: None. IMPRESSION: No acute intracranial abnormality. Chronic microvascular ischemic changes. Electronically Signed   By: Macy Mis M.D.   On: 01/02/2021 15:34   DG Chest Port 1 View  Result Date: 01/02/2021 CLINICAL DATA:  Altered mental status, confusion, lethargy EXAM: PORTABLE CHEST 1 VIEW COMPARISON:  04/14/2018 FINDINGS: Lower lung volumes and elevated right hemidiaphragm. Increased streaky bibasilar opacities favored to be atelectasis, less likely pneumonia. Pleural thickening on the right. No large effusion or pneumothorax. Trachea midline. Mild cardiac enlargement without CHF. Bones are osteopenic. No acute osseous finding. Aorta atherosclerotic. IMPRESSION: Lower lung volumes with elevated right hemidiaphragm. Associated increased basilar atelectasis. Electronically Signed   By: Jerilynn Mages.  Shick M.D.   On: 01/02/2021 15:02   US Abdomen Limited RUQ (LIVER/GB)  Result Date: 01/02/2021 CLINICAL DATA:  Questionable gallstones on CT EXAM: ULTRASOUND ABDOMEN LIMITED RIGHT UPPER QUADRANT COMPARISON:  Ultrasound 12/30/2020, CT 12/30/2020 FINDINGS: Gallbladder: The gallbladder is borderline dilated with prominent intraluminal sludge. There are areas of  gallbladder wall thickening. The stone seen on recent CT is not visualized sonographically. Common bile duct: Diameter: 4.8 mm Liver: No focal lesion identified. Within normal limits in parenchymal echogenicity. Portal vein is patent on color Doppler imaging with normal direction of blood flow towards the liver. Other: None. IMPRESSION: Borderline dilated gallbladder with prominent intraluminal sludge and areas of gallbladder wall thickening. Gallstone seen on recent CT is not well seen sonographically. No common bile duct dilation. Given appearance on recent CT and ultrasound 3 days ago, findings are again concerning for acute cholecystitis. Correlate with exam and laboratory findings. Electronically Signed   By: Maurine Simmering M.D.   On: 01/02/2021 15:41    ROS:  Pertinent items are noted in HPI.  Blood pressure (!) 117/55, pulse (!) 101, temperature (!) 103.9 F (39.9 C), temperature source Rectal, resp. rate (!) 28, height 6\' 2"  (1.88 m), weight 88.5 kg, SpO2 94 %. Physical Exam: Pleasant white male who is febrile, but no acute distress and is hungry Abdomen is soft, nontender nondistended in the right upper quadrant.  No masses palpable.  An ileal conduit bag is noted in the right lower quadrant.  No rigidity is noted.  Ultrasound was personally reviewed.  He does have some mild edema of the gallbladder wall, but no stone is seen.  Minimal pericholecystic fluid was present.  Patient had a negative Murphy sign to exam. Assessment/Plan: Impression: Fever of unknown etiology.  He does have nitrites positive in his urine.  His white blood cell count is actually improved from his last ER visit.  I would continue the fever work-up.  He does not need to be rushed to the operating room for cholecystectomy given his lack of symptomatology and his reasonable  to normal LFTs.  I will follow with you.  He may eat from my standpoint.  Benjamin Yang 01/02/2021, 4:42 PM

## 2021-01-03 DIAGNOSIS — A419 Sepsis, unspecified organism: Secondary | ICD-10-CM | POA: Diagnosis not present

## 2021-01-03 DIAGNOSIS — R6520 Severe sepsis without septic shock: Secondary | ICD-10-CM | POA: Diagnosis not present

## 2021-01-03 DIAGNOSIS — I10 Essential (primary) hypertension: Secondary | ICD-10-CM | POA: Diagnosis not present

## 2021-01-03 DIAGNOSIS — K810 Acute cholecystitis: Secondary | ICD-10-CM | POA: Diagnosis not present

## 2021-01-03 LAB — CBC
HCT: 36.5 % — ABNORMAL LOW (ref 39.0–52.0)
Hemoglobin: 12.4 g/dL — ABNORMAL LOW (ref 13.0–17.0)
MCH: 32.4 pg (ref 26.0–34.0)
MCHC: 34 g/dL (ref 30.0–36.0)
MCV: 95.3 fL (ref 80.0–100.0)
Platelets: 243 10*3/uL (ref 150–400)
RBC: 3.83 MIL/uL — ABNORMAL LOW (ref 4.22–5.81)
RDW: 13 % (ref 11.5–15.5)
WBC: 10.2 10*3/uL (ref 4.0–10.5)
nRBC: 0 % (ref 0.0–0.2)

## 2021-01-03 LAB — COMPREHENSIVE METABOLIC PANEL
ALT: 17 U/L (ref 0–44)
AST: 15 U/L (ref 15–41)
Albumin: 2.6 g/dL — ABNORMAL LOW (ref 3.5–5.0)
Alkaline Phosphatase: 69 U/L (ref 38–126)
Anion gap: 9 (ref 5–15)
BUN: 27 mg/dL — ABNORMAL HIGH (ref 8–23)
CO2: 21 mmol/L — ABNORMAL LOW (ref 22–32)
Calcium: 8.1 mg/dL — ABNORMAL LOW (ref 8.9–10.3)
Chloride: 102 mmol/L (ref 98–111)
Creatinine, Ser: 1.17 mg/dL (ref 0.61–1.24)
GFR, Estimated: >60 mL/min (ref >60)
Glucose, Bld: 176 mg/dL — ABNORMAL HIGH (ref 70–99)
Potassium: 3.9 mmol/L (ref 3.5–5.1)
Sodium: 132 mmol/L — ABNORMAL LOW (ref 135–145)
Total Bilirubin: 1.3 mg/dL — ABNORMAL HIGH (ref 0.3–1.2)
Total Protein: 5.8 g/dL — ABNORMAL LOW (ref 6.5–8.1)

## 2021-01-03 MED ORDER — SODIUM CHLORIDE 0.9 % IV SOLN: INTRAVENOUS | Status: DC

## 2021-01-03 NOTE — Progress Notes (Signed)
   01/03/21 1654  Assess: MEWS Score  Temp (!) 103.2 F (39.6 C)  BP (!) 132/58  Pulse Rate 97  Resp 18  SpO2 94 %  O2 Device Room Air  Assess: MEWS Score  MEWS Temp 2  MEWS Systolic 0  MEWS Pulse 0  MEWS RR 0  MEWS LOC 0  MEWS Score 2  MEWS Score Color Yellow  Assess: if the MEWS score is Yellow or Red  Were vital signs taken at a resting state? Yes  Focused Assessment No change from prior assessment  Early Detection of Sepsis Score *See Row Information* High  Treat  MEWS Interventions Administered prn meds/treatments;Other (Comment) (MD made aware)  Pain Scale 0-10  Pain Score 0  Take Vital Signs  Increase Vital Sign Frequency  Yellow: Q 2hr X 2 then Q 4hr X 2, if remains yellow, continue Q 4hrs  Escalate  MEWS: Escalate Yellow: discuss with charge nurse/RN and consider discussing with provider and RRT  Notify: Charge Nurse/RN  Name of Charge Nurse/RN Notified Raquel Sarna RN  Date Charge Nurse/RN Notified 01/03/21  Time Charge Nurse/RN Notified 1654  Notify: Provider  Provider Name/Title Tat  Date Provider Notified 01/03/21  Time Provider Notified 1654  Notification Type Page  Notification Reason Other (Comment) (Mews score)  Provider response See new orders  Date of Provider Response 01/03/21  Time of Provider Response 1655

## 2021-01-03 NOTE — Progress Notes (Signed)
Subjective: Patient denies any right upper quadrant abdominal pain.  He states he did have some arthritic pain in his right shoulder.  He denies any nausea or vomiting.  Objective: Vital signs in last 24 hours: Temp:  [98.4 F (36.9 C)-103.9 F (39.9 C)] 99.8 F (37.7 C) (11/05 0442) Pulse Rate:  [77-101] 100 (11/05 0434) Resp:  [19-28] 20 (11/05 0434) BP: (110-145)/(55-87) 145/64 (11/05 0434) SpO2:  [91 %-96 %] 92 % (11/05 0434) Weight:  [88.5 kg-91 kg] 91 kg (11/04 1813) Last BM Date: 12/31/20  Intake/Output from previous day: 11/04 0701 - 11/05 0700 In: 4130.6 [P.O.:440; I.V.:1190.6; IV Piggyback:2500] Out: 1725 [Urine:1725] Intake/Output this shift: Total I/O In: 120 [P.O.:120] Out: 700 [Urine:700]  General appearance: alert, cooperative, and no distress GI: soft, non-tender; bowel sounds normal; no masses,  no organomegaly  Lab Results:  Recent Labs    01/02/21 1454 01/03/21 0457  WBC 12.6* 10.2  HGB 12.3* 12.4*  HCT 35.6* 36.5*  PLT 246 243   BMET Recent Labs    01/02/21 1454 01/03/21 0457  NA 129* 132*  K 3.7 3.9  CL 98 102  CO2 21* 21*  GLUCOSE 160* 176*  BUN 33* 27*  CREATININE 1.42* 1.17  CALCIUM 8.3* 8.1*   PT/INR Recent Labs    01/02/21 1550  LABPROT 14.5  INR 1.1    Studies/Results: CT HEAD WO CONTRAST  Result Date: 01/02/2021 CLINICAL DATA:  Mental status change, unknown cause EXAM: CT HEAD WITHOUT CONTRAST TECHNIQUE: Contiguous axial images were obtained from the base of the skull through the vertex without intravenous contrast. COMPARISON:  2013 FINDINGS: Brain: There is no acute intracranial hemorrhage, mass effect, or edema. Gray-white differentiation is preserved. There is no extra-axial fluid collection. Prominence of the ventricles and sulci reflects parenchymal volume loss. Patchy hypoattenuation in the supratentorial white matter is nonspecific but probably reflects moderate chronic microvascular ischemic changes. Vascular:  There is atherosclerotic calcification at the skull base. Skull: Calvarium is unremarkable. Sinuses/Orbits: No acute finding. Other: None. IMPRESSION: No acute intracranial abnormality. Chronic microvascular ischemic changes. Electronically Signed   By: Macy Mis M.D.   On: 01/02/2021 15:34   DG Chest Port 1 View  Result Date: 01/02/2021 CLINICAL DATA:  Altered mental status, confusion, lethargy EXAM: PORTABLE CHEST 1 VIEW COMPARISON:  04/14/2018 FINDINGS: Lower lung volumes and elevated right hemidiaphragm. Increased streaky bibasilar opacities favored to be atelectasis, less likely pneumonia. Pleural thickening on the right. No large effusion or pneumothorax. Trachea midline. Mild cardiac enlargement without CHF. Bones are osteopenic. No acute osseous finding. Aorta atherosclerotic. IMPRESSION: Lower lung volumes with elevated right hemidiaphragm. Associated increased basilar atelectasis. Electronically Signed   By: Jerilynn Mages.  Shick M.D.   On: 01/02/2021 15:02   US Abdomen Limited RUQ (LIVER/GB)  Result Date: 01/02/2021 CLINICAL DATA:  Questionable gallstones on CT EXAM: ULTRASOUND ABDOMEN LIMITED RIGHT UPPER QUADRANT COMPARISON:  Ultrasound 12/30/2020, CT 12/30/2020 FINDINGS: Gallbladder: The gallbladder is borderline dilated with prominent intraluminal sludge. There are areas of gallbladder wall thickening. The stone seen on recent CT is not visualized sonographically. Common bile duct: Diameter: 4.8 mm Liver: No focal lesion identified. Within normal limits in parenchymal echogenicity. Portal vein is patent on color Doppler imaging with normal direction of blood flow towards the liver. Other: None. IMPRESSION: Borderline dilated gallbladder with prominent intraluminal sludge and areas of gallbladder wall thickening. Gallstone seen on recent CT is not well seen sonographically. No common bile duct dilation. Given appearance on recent CT and ultrasound 3 days ago,  findings are again concerning for acute  cholecystitis. Correlate with exam and laboratory findings. Electronically Signed   By: Maurine Simmering M.D.   On: 01/02/2021 15:41    Anti-infectives: Anti-infectives (From admission, onward)    Start     Dose/Rate Route Frequency Ordered Stop   01/03/21 0600  vancomycin (VANCOREADY) IVPB 1250 mg/250 mL  Status:  Discontinued        1,250 mg 166.7 mL/hr over 90 Minutes Intravenous Every 18 hours 01/02/21 1605 01/02/21 2026   01/03/21 0500  metroNIDAZOLE (FLAGYL) IVPB 500 mg        500 mg 100 mL/hr over 60 Minutes Intravenous Every 12 hours 01/02/21 2016     01/03/21 0100  ceFEPIme (MAXIPIME) 2 g in sodium chloride 0.9 % 100 mL IVPB        2 g 200 mL/hr over 30 Minutes Intravenous Every 8 hours 01/02/21 1605     01/02/21 1530  ceFEPIme (MAXIPIME) 2 g in sodium chloride 0.9 % 100 mL IVPB        2 g 200 mL/hr over 30 Minutes Intravenous  Once 01/02/21 1527 01/02/21 1638   01/02/21 1530  metroNIDAZOLE (FLAGYL) IVPB 500 mg        500 mg 100 mL/hr over 60 Minutes Intravenous  Once 01/02/21 1527 01/02/21 1718   01/02/21 1530  vancomycin (VANCOREADY) IVPB 1000 mg/200 mL        1,000 mg 200 mL/hr over 60 Minutes Intravenous  Once 01/02/21 1527 01/02/21 1744       Assessment/Plan: Impression: Patient currently does not have any right upper quadrant abdominal pain.  His renal function has improved.  His leukocytosis has improved. Plan: Will advance his diet.  He may need a HIDA scan to further assess his gallbladder.  This is available on 01/05/2021.  Would continue antibiotics for now.  May de-escalate antibiotics from the surgery standpoint.  LOS: 1 day    Aviva Signs 01/03/2021

## 2021-01-03 NOTE — Progress Notes (Signed)
PROGRESS NOTE  Benjamin Yang ZOX:[id number] DOB: 1935/10/21 DOA: 01/02/2021 PCP: Monico Blitz, MD  Brief History:  85 year old male with a history of hypertension, bladder cancer status post cystoprostatectomy with ileal conduit, anxiety presenting with at least 3 days of abdominal pain isolated to the right upper quadrant and epigastric area.  Patient initially presented to the emergency department on 12/30/2020.  CT of the abdomen and pelvis was performed at that time and showed a distended gallbladder with a gallbladder neck stone.  There was mild diffuse gallbladder wall thickening or pericholecystic soft tissue stranding.  The patient was treated symptomatically and discharged home with follow-up with general surgery (Dr. Arnoldo Morale).  This follow-up was supposed to be on 01/06/2021.  However, the patient continued to have intermittent right upper quadrant pain since his ED visit.   Prior to coming to the hospital he had a meal of fried fish, cake, and a shot of whiskey which induced intense right upper quadrant pain that would bring him to his knees and radiated down his right side.  In the ED, the patient had a fever up to 103.9 F with tachycardia 100-110.  He was hemodynamically stable with oxygen saturation 94% room air.  BMP showed sodium 139, potassium 3.7, creatinine 1.42.  LFTs were unremarkable.  Lipase was 18.  WBC 12.6, hemoglobin 12.3, platelets 246,000.  Lactic acid was 1.0.  Chest x-ray showed low volumes with bibasilar atelectasis.  CT of the brain was negative.  General surgery was consulted to assist with management.  Assessment/Plan: Sepsis -Present on admission -Presented with fever, tachycardia, and leukocytosis -Follow blood cultures -Urine cultures of uncertain significance given they were collected from the patient's ileal conduit -Continue cefepime and metronidazole -Patient continues to be febrile -Personally reviewed chest x-ray--no infiltrates or  consolidations  Acute cholecystitis -Appreciate general Surgery consult -Case discussed with Dr. Arnoldo Morale -Continue cefepime and metronidazole -Continue IV fluids -01/02/2021 RUQ US--borderline dilated GB, prominent sludge, areas of wall thickening, no CBD dilatation.  Essential hypertension -Holding ARB for now as the patient is normotensive  Anxiety -Continue home dose lorazepam     Status is: Inpatient  Remains inpatient appropriate because: Severity of illness requiring IV antibiotics and possible surgical intervention        Family Communication:   spouse updated at bedside 11/5  Consultants: General surgery  Code Status:  FULL  DVT Prophylaxis:  [place] Heparin    Procedures: As Listed in Progress Note Above  Antibiotics: Cefepime 11/5>> Flagyl 11/5>>      Subjective: Patient states that abdominal pain is somewhat better than yesterday.  He denies any headache, neck pain, chest pain, cough breath, fevers, chills, nausea, vomiting or diarrhea or abdominal pain.  Objective: Vitals:   01/03/21 0053 01/03/21 0434 01/03/21 0442 01/03/21 1400  BP: 132/83 (!) 145/64  134/65  Pulse: 85 100  96  Resp: 19 20  18   Temp: 98.4 F (36.9 C) (!) 101.1 F (38.4 C) 99.8 F (37.7 C) 97.7 F (36.5 C)  TempSrc: Oral Oral Oral Oral  SpO2: 94% 92%  96%  Weight:      Height:        Intake/Output Summary (Last 24 hours) at 01/03/2021 1637 Last data filed at 01/03/2021 1509 Gross per 24 hour  Intake 3845.18 ml  Output 2975 ml  Net 870.18 ml   Weight change:  Exam:  General:  Pt is alert, follows commands appropriately, not in acute distress HEENT:  No icterus, No thrush, No neck mass, [place]/AT Cardiovascular: RRR, S1/S2, no rubs, no gallops Respiratory: Bibasilar rales but no wheezing Abdomen: Soft/+BS, non tender, non distended, no guarding Extremities: No edema, No lymphangitis, No petechiae, No rashes, no synovitis   Data Reviewed: I have personally reviewed  following labs and imaging studies Basic Metabolic Panel: Recent Labs  Lab 12/30/20 1030 01/02/21 1454 01/03/21 0457  NA 132* 129* 132*  K 3.9 3.7 3.9  CL 98 98 102  CO2 23 21* 21*  GLUCOSE 189* 160* 176*  BUN 14 33* 27*  CREATININE 1.02 1.42* 1.17  CALCIUM 9.1 8.3* 8.1*   Liver Function Tests: Recent Labs  Lab 12/30/20 1030 01/02/21 1454 01/03/21 0457  AST 16 15 15   ALT 18 18 17   ALKPHOS 70 77 69  BILITOT 1.1 1.5* 1.3*  PROT 8.3* 6.6 5.8*  ALBUMIN 4.3 2.9* 2.6*   Recent Labs  Lab 12/30/20 1030 01/02/21 1454  LIPASE 23 18   No results for input(s): AMMONIA in the last 168 hours. Coagulation Profile: Recent Labs  Lab 01/02/21 1550  INR 1.1   CBC: Recent Labs  Lab 12/30/20 1030 01/02/21 1454 01/03/21 0457  WBC 15.2* 12.6* 10.2  NEUTROABS  --  10.4*  --   HGB 15.2 12.3* 12.4*  HCT 44.2 35.6* 36.5*  MCV 95.3 94.9 95.3  PLT 272 246 243   Cardiac Enzymes: No results for input(s): CKTOTAL, CKMB, CKMBINDEX, TROPONINI in the last 168 hours. BNP: Invalid input(s): POCBNP CBG: No results for input(s): GLUCAP in the last 168 hours. HbA1C: No results for input(s): HGBA1C in the last 72 hours. Urine analysis:    Component Value Date/Time   COLORURINE YELLOW 01/02/2021 1454   APPEARANCEUR HAZY (A) 01/02/2021 1454   LABSPEC 1.012 01/02/2021 1454   PHURINE 9.0 (H) 01/02/2021 1454   GLUCOSEU NEGATIVE 01/02/2021 1454   HGBUR NEGATIVE 01/02/2021 1454   BILIRUBINUR NEGATIVE 01/02/2021 1454   KETONESUR NEGATIVE 01/02/2021 1454   PROTEINUR 100 (A) 01/02/2021 1454   UROBILINOGEN 0.2 04/21/2012 0200   NITRITE POSITIVE (A) 01/02/2021 1454   LEUKOCYTESUR TRACE (A) 01/02/2021 1454   Sepsis Labs: @LABRCNTIP (procalcitonin:4,lacticidven:4) ) Recent Results (from the past 240 hour(s))  Urine Culture     Status: Abnormal   Collection Time: 12/30/20 12:03 PM   Specimen: Urine, Clean Catch  Result Value Ref Range Status   Specimen Description   Final    URINE,  CLEAN CATCH Performed at [facility], [address], [place], [place] [address]    Special Requests   Final    NONE Performed at [facility], [address] [place], [place] [address]    Culture (A)  Final    >=100,000 COLONIES/mL PROTEUS MIRABILIS >=100,000 COLONIES/mL KLEBSIELLA OXYTOCA    Report Status 01/02/2021 FINAL  Final   Organism ID, Bacteria PROTEUS MIRABILIS (A)  Final   Organism ID, Bacteria KLEBSIELLA OXYTOCA (A)  Final      Susceptibility   Klebsiella oxytoca - MIC*    AMPICILLIN RESISTANT Resistant     CEFAZOLIN 16 SENSITIVE Sensitive     CEFEPIME <=0.12 SENSITIVE Sensitive     CEFTRIAXONE <=0.25 SENSITIVE Sensitive     CIPROFLOXACIN <=0.25 SENSITIVE Sensitive     GENTAMICIN <=1 SENSITIVE Sensitive     IMIPENEM <=0.25 SENSITIVE Sensitive     NITROFURANTOIN <=16 SENSITIVE Sensitive     TRIMETH/SULFA <=20 SENSITIVE Sensitive     AMPICILLIN/SULBACTAM 4 SENSITIVE Sensitive     PIP/TAZO <=4 SENSITIVE Sensitive     * >=  100,000 COLONIES/mL KLEBSIELLA OXYTOCA   Proteus mirabilis - MIC*    AMPICILLIN <=2 SENSITIVE Sensitive     CEFAZOLIN <=4 SENSITIVE Sensitive     CEFEPIME <=0.12 SENSITIVE Sensitive     CEFTRIAXONE <=0.25 SENSITIVE Sensitive     CIPROFLOXACIN <=0.25 SENSITIVE Sensitive     GENTAMICIN <=1 SENSITIVE Sensitive     IMIPENEM <=0.25 SENSITIVE Sensitive     NITROFURANTOIN RESISTANT Resistant     TRIMETH/SULFA <=20 SENSITIVE Sensitive     AMPICILLIN/SULBACTAM <=2 SENSITIVE Sensitive     PIP/TAZO <=4 SENSITIVE Sensitive     * >=100,000 COLONIES/mL PROTEUS MIRABILIS  Resp Panel by RT-PCR (Flu A&B, Covid) Urine, Clean Catch     Status: None   Collection Time: 01/02/21  2:54 PM   Specimen: Urine, Clean Catch; Nasopharyngeal(NP) swabs in vial transport medium  Result Value Ref Range Status   SARS Coronavirus 2 by RT PCR NEGATIVE NEGATIVE Final    Comment: (NOTE) SARS-CoV-2 target nucleic acids are NOT DETECTED.  The SARS-CoV-2 RNA is generally detectable  in upper respiratory specimens during the acute phase of infection. The lowest concentration of SARS-CoV-2 viral copies this assay can detect is 138 copies/mL. A negative result does not preclude SARS-Cov-2 infection and should not be used as the sole basis for treatment or other patient management decisions. A negative result may occur with  improper specimen collection/handling, submission of specimen other than nasopharyngeal swab, presence of viral mutation(s) within the areas targeted by this assay, and inadequate number of viral copies(<138 copies/mL). A negative result must be combined with clinical observations, patient history, and epidemiological information. The expected result is Negative.  Fact Sheet for Patients:  EntrepreneurPulse.com.au  Fact Sheet for Healthcare Providers:  IncredibleEmployment.be  This test is no t yet approved or cleared by the [place] FDA and  has been authorized for detection and/or diagnosis of SARS-CoV-2 by FDA under an Emergency Use Authorization (EUA). This EUA will remain  in effect (meaning this test can be used) for the duration of the COVID-19 declaration under Section 564(b)(1) of the Act, 21 U.S.C.section 360bbb-3(b)(1), unless the authorization is terminated  or revoked sooner.       Influenza A by PCR NEGATIVE NEGATIVE Final   Influenza B by PCR NEGATIVE NEGATIVE Final    Comment: (NOTE) The Xpert Xpress SARS-CoV-2/FLU/RSV plus assay is intended as an aid in the diagnosis of influenza from Nasopharyngeal swab specimens and should not be used as a sole basis for treatment. Nasal washings and aspirates are unacceptable for Xpert Xpress SARS-CoV-2/FLU/RSV testing.  Fact Sheet for Patients: EntrepreneurPulse.com.au  Fact Sheet for Healthcare Providers: IncredibleEmployment.be  This test is not yet approved or cleared by the [place] FDA and has  been authorized for detection and/or diagnosis of SARS-CoV-2 by FDA under an Emergency Use Authorization (EUA). This EUA will remain in effect (meaning this test can be used) for the duration of the COVID-19 declaration under Section 564(b)(1) of the Act, 21 U.S.C. section 360bbb-3(b)(1), unless the authorization is terminated or revoked.  Performed at [facility], [address] [place], [place] [address]   Blood Culture (routine x 2)     Status: None (Preliminary result)   Collection Time: 01/02/21  3:45 PM   Specimen: BLOOD RIGHT ARM  Result Value Ref Range Status   Specimen Description BLOOD RIGHT ARM  Final   Special Requests   Final    Blood Culture adequate volume BOTTLES DRAWN AEROBIC AND ANAEROBIC   Culture   Final  NO GROWTH < 24 HOURS Performed at [facility], [address] [place], [place] [address]    Report Status PENDING  Incomplete  Blood Culture (routine x 2)     Status: None (Preliminary result)   Collection Time: 01/02/21  3:49 PM   Specimen: Right Antecubital; Blood  Result Value Ref Range Status   Specimen Description RIGHT ANTECUBITAL  Final   Special Requests   Final    Blood Culture results may not be optimal due to an excessive volume of blood received in culture bottles BOTTLES DRAWN AEROBIC AND ANAEROBIC   Culture   Final    NO GROWTH < 24 HOURS Performed at [facility] [address] [place], [place] [address]    Report Status PENDING  Incomplete     Scheduled Meds:  heparin  5,000 Units Subcutaneous Q8H   sodium chloride flush  3 mL Intravenous Q12H   Continuous Infusions:  ceFEPime (MAXIPIME) IV 2 g (01/03/21 6659)   metronidazole 500 mg (01/03/21 0645)    Procedures/Studies: CT HEAD WO CONTRAST  Result Date: 01/02/2021 CLINICAL DATA:  Mental status change, unknown cause EXAM: CT HEAD WITHOUT CONTRAST TECHNIQUE: Contiguous axial images were obtained from the base of the skull through the vertex without intravenous contrast.  COMPARISON:  2013 FINDINGS: Brain: There is no acute intracranial hemorrhage, mass effect, or edema. Gray-white differentiation is preserved. There is no extra-axial fluid collection. Prominence of the ventricles and sulci reflects parenchymal volume loss. Patchy hypoattenuation in the supratentorial white matter is nonspecific but probably reflects moderate chronic microvascular ischemic changes. Vascular: There is atherosclerotic calcification at the skull base. Skull: Calvarium is unremarkable. Sinuses/Orbits: No acute finding. Other: None. IMPRESSION: No acute intracranial abnormality. Chronic microvascular ischemic changes. Electronically Signed   By: Macy Mis M.D.   On: 01/02/2021 15:34   CT Abdomen Pelvis W Contrast  Result Date: 12/30/2020 CLINICAL DATA:  Right-sided abdominal pain and nausea for 3 days. Prior surgery for bladder carcinoma. EXAM: CT ABDOMEN AND PELVIS WITH CONTRAST TECHNIQUE: Multidetector CT imaging of the abdomen and pelvis was performed using the standard protocol following bolus administration of intravenous contrast. CONTRAST:  137mL OMNIPAQUE IOHEXOL 300 MG/ML  SOLN COMPARISON:  04/12/2017 from [facility] FINDINGS: Lower Chest: No acute findings. Hepatobiliary: No hepatic masses identified. Sub-cm cyst noted in the anterior right hepatic lobe. Distended gallbladder is seen with 4 mm gallstone in the gallbladder neck. Mild diffuse gallbladder wall thickening and pericholecystic soft tissue stranding are also seen, consistent with acute cholecystitis. No evidence of biliary ductal dilatation. Pancreas:  No mass or inflammatory changes. Spleen: Within normal limits in size and appearance. Adrenals/Urinary Tract: No masses identified. Small bilateral renal cysts are noted. No evidence of ureteral calculi or hydronephrosis. Stable postop changes from cystectomy with ileal loop diversion. Stomach/Bowel: Increased size of small to moderate hiatal hernia. No  evidence of obstruction, inflammatory process or abnormal fluid collections. Diverticulosis is seen mainly involving the descending and sigmoid colon, however there is no evidence of diverticulitis. 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 Vascular/Lymphatic: No pathologically enlarged lymph nodes. No acute vascular findings. Aortic atherosclerotic calcification noted. Reproductive:  Stable postop changes from cystoprostatectomy. Other: 2 small left paraumbilical ventral hernias are again seen, both containing only fat and without significant change compared to prior exam. Musculoskeletal:  No suspicious bone lesions identified. IMPRESSION: Findings consistent with acute cholecystitis. No evidence of biliary obstruction or other complication. Stable postop changes from cystoprostatectomy with ileal  loop diversion. No evidence of recurrent or metastatic carcinoma. Increased size of small to moderate hiatal hernia. Stable small left paraumbilical ventral hernias, which contain only fat. Colonic diverticulosis, without radiographic evidence of diverticulitis. Aortic Atherosclerosis (ICD10-I70.0). Electronically Signed   By: Marlaine Hind M.D.   On: 12/30/2020 11:55   DG Chest Port 1 View  Result Date: 01/02/2021 CLINICAL DATA:  Altered mental status, confusion, lethargy EXAM: PORTABLE CHEST 1 VIEW COMPARISON:  04/14/2018 FINDINGS: Lower lung volumes and elevated right hemidiaphragm. Increased streaky bibasilar opacities favored to be atelectasis, less likely pneumonia. Pleural thickening on the right. No large effusion or pneumothorax. Trachea midline. Mild cardiac enlargement without CHF. Bones are osteopenic. No acute osseous finding. Aorta atherosclerotic. IMPRESSION: Lower lung volumes with elevated right hemidiaphragm. Associated increased basilar atelectasis. Electronically Signed   By: Jerilynn Mages.  Shick M.D.   On: 01/02/2021 15:02   US Abdomen Limited RUQ (LIVER/GB)  Result Date:  01/02/2021 CLINICAL DATA:  Questionable gallstones on CT EXAM: ULTRASOUND ABDOMEN LIMITED RIGHT UPPER QUADRANT COMPARISON:  Ultrasound 12/30/2020, CT 12/30/2020 FINDINGS: Gallbladder: The gallbladder is borderline dilated with prominent intraluminal sludge. There are areas of gallbladder wall thickening. The stone seen on recent CT is not visualized sonographically. Common bile duct: Diameter: 4.8 mm Liver: No focal lesion identified. Within normal limits in parenchymal echogenicity. Portal vein is patent on color Doppler imaging with normal direction of blood flow towards the liver. Other: None. IMPRESSION: Borderline dilated gallbladder with prominent intraluminal sludge and areas of gallbladder wall thickening. Gallstone seen on recent CT is not well seen sonographically. No common bile duct dilation. Given appearance on recent CT and ultrasound 3 days ago, findings are again concerning for acute cholecystitis. Correlate with exam and laboratory findings. Electronically Signed   By: Maurine Simmering M.D.   On: 01/02/2021 15:41   US Abdomen Limited RUQ (LIVER/GB)  Result Date: 12/30/2020 CLINICAL DATA:  Right upper quadrant abdominal pain and nausea for 3 days. Gallstone in the neck of the gallbladder on CT scan today. EXAM: ULTRASOUND ABDOMEN LIMITED RIGHT UPPER QUADRANT COMPARISON:  CT scan 12/30/2020 FINDINGS: Gallbladder: Mild gallbladder wall thickening varying between 3-5 mm. There is sludge in the gallbladder but the stone seen in the neck of the gallbladder on CT is difficult to visualize on today's ultrasound. Sonographic Percell Miller sign is documented as being present. Trace pericholecystic fluid. Common bile duct: Diameter: 0.2 cm Liver: Equivocal accentuated hepatic echogenicity. No focal hepatic lesion identified. No intrahepatic biliary dilatation. Portal vein is patent on color Doppler imaging with normal direction of blood flow towards the liver. Other: None. IMPRESSION: 1. Gallbladder wall thickening  with mild pericholecystic fluid and sonographic Murphy's sign present. The small stone in the neck of the gallbladder seen on CT is not as well appreciated on today's ultrasound. There is no CBD dilatation. Correlate clinically in assessing for acute cholecystitis. 2. Nonspecific and equivocal accentuated hepatic echogenicity diffusely, without focal hepatic lesion. Electronically Signed   By: Van Clines M.D.   On: 12/30/2020 13:54    Orson Eva, DO  [facility]  If 7PM-7AM, please contact night-coverage [email or web address] Password [email or web address] 01/03/2021, 4:37 PM   LOS: 1 day

## 2021-01-04 ENCOUNTER — Inpatient Hospital Stay (HOSPITAL_COMMUNITY): Payer: Medicare Other

## 2021-01-04 DIAGNOSIS — K810 Acute cholecystitis: Secondary | ICD-10-CM

## 2021-01-04 DIAGNOSIS — I10 Essential (primary) hypertension: Secondary | ICD-10-CM | POA: Diagnosis not present

## 2021-01-04 DIAGNOSIS — R6520 Severe sepsis without septic shock: Secondary | ICD-10-CM | POA: Diagnosis not present

## 2021-01-04 DIAGNOSIS — A419 Sepsis, unspecified organism: Secondary | ICD-10-CM | POA: Diagnosis not present

## 2021-01-04 LAB — CBC
HCT: 32.8 % — ABNORMAL LOW (ref 39.0–52.0)
Hemoglobin: 11.2 g/dL — ABNORMAL LOW (ref 13.0–17.0)
MCH: 32.3 pg (ref 26.0–34.0)
MCHC: 34.1 g/dL (ref 30.0–36.0)
MCV: 94.5 fL (ref 80.0–100.0)
Platelets: 204 10*3/uL (ref 150–400)
RBC: 3.47 MIL/uL — ABNORMAL LOW (ref 4.22–5.81)
RDW: 13.4 % (ref 11.5–15.5)
WBC: 11.1 10*3/uL — ABNORMAL HIGH (ref 4.0–10.5)
nRBC: 0 % (ref 0.0–0.2)

## 2021-01-04 LAB — COMPREHENSIVE METABOLIC PANEL
ALT: 22 U/L (ref 0–44)
AST: 23 U/L (ref 15–41)
Albumin: 2.3 g/dL — ABNORMAL LOW (ref 3.5–5.0)
Alkaline Phosphatase: 66 U/L (ref 38–126)
Anion gap: 8 (ref 5–15)
BUN: 23 mg/dL (ref 8–23)
CO2: 22 mmol/L (ref 22–32)
Calcium: 8.1 mg/dL — ABNORMAL LOW (ref 8.9–10.3)
Chloride: 103 mmol/L (ref 98–111)
Creatinine, Ser: 1.03 mg/dL (ref 0.61–1.24)
GFR, Estimated: >60 mL/min (ref >60)
Glucose, Bld: 196 mg/dL — ABNORMAL HIGH (ref 70–99)
Potassium: 3.5 mmol/L (ref 3.5–5.1)
Sodium: 133 mmol/L — ABNORMAL LOW (ref 135–145)
Total Bilirubin: 1.1 mg/dL (ref 0.3–1.2)
Total Protein: 5.6 g/dL — ABNORMAL LOW (ref 6.5–8.1)

## 2021-01-04 LAB — PROCALCITONIN: Procalcitonin: 1.19 ng/mL

## 2021-01-04 LAB — LACTIC ACID, PLASMA
Lactic Acid, Venous: 2.6 mmol/L (ref 0.5–1.9)
Lactic Acid, Venous: 1.3 mmol/L (ref 0.5–1.9)

## 2021-01-04 MED ORDER — LACTATED RINGERS IV BOLUS
500.0000 mL | Freq: Once | INTRAVENOUS | Status: AC
  Administered 2021-01-04: 500 mL via INTRAVENOUS

## 2021-01-04 MED ORDER — LACTATED RINGERS IV BOLUS
1000.0000 mL | Freq: Once | INTRAVENOUS | Status: AC
  Administered 2021-01-04: 1000 mL via INTRAVENOUS

## 2021-01-04 MED ORDER — KETOROLAC TROMETHAMINE 15 MG/ML IJ SOLN
15.0000 mg | Freq: Once | INTRAMUSCULAR | Status: AC
  Administered 2021-01-04: 15 mg via INTRAVENOUS
  Filled 2021-01-04: qty 1

## 2021-01-04 MED ORDER — VANCOMYCIN HCL 1500 MG/300ML IV SOLN
1500.0000 mg | Freq: Once | INTRAVENOUS | Status: AC
  Administered 2021-01-04: 1500 mg via INTRAVENOUS
  Filled 2021-01-04: qty 300

## 2021-01-04 MED ORDER — VANCOMYCIN HCL 1000 MG/200ML IV SOLN
1000.0000 mg | Freq: Two times a day (BID) | INTRAVENOUS | Status: DC
  Administered 2021-01-05 – 2021-01-07 (×6): 1000 mg via INTRAVENOUS
  Filled 2021-01-04 (×5): qty 200

## 2021-01-04 NOTE — Progress Notes (Signed)
   01/04/21 1321  Vitals  Temp (!) 102.5 F (39.2 C)  Temp Source Rectal  BP (!) 98/58  MAP (mmHg) 68  BP Location Right Arm  BP Method Automatic  Patient Position (if appropriate) Lying  Pulse Rate (!) 119  Pulse Rate Source Dinamap  Resp (!) 34  MEWS COLOR  MEWS Score Color Red  Oxygen Therapy  SpO2 97 %  O2 Device Room Air  MEWS Score  MEWS Temp 2  MEWS Systolic 1  MEWS Pulse 2  MEWS RR 2  MEWS LOC 0  MEWS Score 7   Pt's wife states pt having hard chills, arrived to room to find pt with labored respirations 34/min, expiratory wheezing, HR elevated. Temp checked rectally at 102.5. MD Tat notified via amion page of current condition.  MD Tat in to evaluate pt at this time. Orders received. Pt and wife updated on plan of care.

## 2021-01-04 NOTE — Progress Notes (Addendum)
Subjective: Patient denies any right upper quadrant abdominal pain, nausea, or vomiting.  He is a little tender just around his ileal conduit.  Tolerating heart healthy diet well.  Objective: Vital signs in last 24 hours: Temp:  [97.7 F (36.5 C)-103.2 F (39.6 C)] 100.4 F (38 C) (11/06 0941) Pulse Rate:  [77-97] 90 (11/06 0941) Resp:  [18-20] 20 (11/06 0941) BP: (113-140)/(56-76) 140/76 (11/06 0941) SpO2:  [94 %-96 %] 95 % (11/06 0941) Last BM Date: 12/31/20  Intake/Output from previous day: 11/05 0701 - 11/06 0700 In: 2195.7 [P.O.:540; I.V.:1135.8; IV Piggyback:520] Out: 4008 [Urine:1650] Intake/Output this shift: Total I/O In: 240 [P.O.:240] Out: 250 [Urine:250]  General appearance: alert, cooperative, and no distress GI: Soft with no tenderness in the right upper quadrant to palpation.  He is somewhat tender around his ileal conduit.  Lab Results:  Recent Labs    01/03/21 0457 01/04/21 0433  WBC 10.2 11.1*  HGB 12.4* 11.2*  HCT 36.5* 32.8*  PLT 243 204   BMET Recent Labs    01/03/21 0457 01/04/21 0433  NA 132* 133*  K 3.9 3.5  CL 102 103  CO2 21* 22  GLUCOSE 176* 196*  BUN 27* 23  CREATININE 1.17 1.03  CALCIUM 8.1* 8.1*   PT/INR Recent Labs    01/02/21 1550  LABPROT 14.5  INR 1.1    Studies/Results: CT HEAD WO CONTRAST  Result Date: 01/02/2021 CLINICAL DATA:  Mental status change, unknown cause EXAM: CT HEAD WITHOUT CONTRAST TECHNIQUE: Contiguous axial images were obtained from the base of the skull through the vertex without intravenous contrast. COMPARISON:  2013 FINDINGS: Brain: There is no acute intracranial hemorrhage, mass effect, or edema. Gray-white differentiation is preserved. There is no extra-axial fluid collection. Prominence of the ventricles and sulci reflects parenchymal volume loss. Patchy hypoattenuation in the supratentorial white matter is nonspecific but probably reflects moderate chronic microvascular ischemic changes.  Vascular: There is atherosclerotic calcification at the skull base. Skull: Calvarium is unremarkable. Sinuses/Orbits: No acute finding. Other: None. IMPRESSION: No acute intracranial abnormality. Chronic microvascular ischemic changes. Electronically Signed   By: Macy Mis M.D.   On: 01/02/2021 15:34   DG Chest Port 1 View  Result Date: 01/02/2021 CLINICAL DATA:  Altered mental status, confusion, lethargy EXAM: PORTABLE CHEST 1 VIEW COMPARISON:  04/14/2018 FINDINGS: Lower lung volumes and elevated right hemidiaphragm. Increased streaky bibasilar opacities favored to be atelectasis, less likely pneumonia. Pleural thickening on the right. No large effusion or pneumothorax. Trachea midline. Mild cardiac enlargement without CHF. Bones are osteopenic. No acute osseous finding. Aorta atherosclerotic. IMPRESSION: Lower lung volumes with elevated right hemidiaphragm. Associated increased basilar atelectasis. Electronically Signed   By: Jerilynn Mages.  Shick M.D.   On: 01/02/2021 15:02   US Abdomen Limited RUQ (LIVER/GB)  Result Date: 01/02/2021 CLINICAL DATA:  Questionable gallstones on CT EXAM: ULTRASOUND ABDOMEN LIMITED RIGHT UPPER QUADRANT COMPARISON:  Ultrasound 12/30/2020, CT 12/30/2020 FINDINGS: Gallbladder: The gallbladder is borderline dilated with prominent intraluminal sludge. There are areas of gallbladder wall thickening. The stone seen on recent CT is not visualized sonographically. Common bile duct: Diameter: 4.8 mm Liver: No focal lesion identified. Within normal limits in parenchymal echogenicity. Portal vein is patent on color Doppler imaging with normal direction of blood flow towards the liver. Other: None. IMPRESSION: Borderline dilated gallbladder with prominent intraluminal sludge and areas of gallbladder wall thickening. Gallstone seen on recent CT is not well seen sonographically. No common bile duct dilation. Given appearance on recent CT and ultrasound 3 days  ago, findings are again concerning for  acute cholecystitis. Correlate with exam and laboratory findings. Electronically Signed   By: Maurine Simmering M.D.   On: 01/02/2021 15:41    Anti-infectives: Anti-infectives (From admission, onward)    Start     Dose/Rate Route Frequency Ordered Stop   01/03/21 0600  vancomycin (VANCOREADY) IVPB 1250 mg/250 mL  Status:  Discontinued        1,250 mg 166.7 mL/hr over 90 Minutes Intravenous Every 18 hours 01/02/21 1605 01/02/21 2026   01/03/21 0500  metroNIDAZOLE (FLAGYL) IVPB 500 mg        500 mg 100 mL/hr over 60 Minutes Intravenous Every 12 hours 01/02/21 2016     01/03/21 0100  ceFEPIme (MAXIPIME) 2 g in sodium chloride 0.9 % 100 mL IVPB        2 g 200 mL/hr over 30 Minutes Intravenous Every 8 hours 01/02/21 1605     01/02/21 1530  ceFEPIme (MAXIPIME) 2 g in sodium chloride 0.9 % 100 mL IVPB        2 g 200 mL/hr over 30 Minutes Intravenous  Once 01/02/21 1527 01/02/21 1638   01/02/21 1530  metroNIDAZOLE (FLAGYL) IVPB 500 mg        500 mg 100 mL/hr over 60 Minutes Intravenous  Once 01/02/21 1527 01/02/21 1718   01/02/21 1530  vancomycin (VANCOREADY) IVPB 1000 mg/200 mL        1,000 mg 200 mL/hr over 60 Minutes Intravenous  Once 01/02/21 1527 01/02/21 1744       Assessment/Plan: Impression: Abdominal pain of unknown etiology.  Patient did have fever to 100. Plan: Liver enzyme tests within normal limits.  We will do HIDA scan tomorrow.  Further management is pending those results.  LOS: 2 days    Aviva Signs 01/04/2021

## 2021-01-04 NOTE — Progress Notes (Signed)
Date and time results received: 01/04/21 1840 (use smartphrase ".now" to insert current time)  Test: lactic acid Critical Value: 2.6  Name of Provider Notified: Tat  Orders Received? Or Actions Taken?: awaiting response

## 2021-01-04 NOTE — Progress Notes (Signed)
PROGRESS NOTE  Benjamin Yang TDV:[id number] DOB: [birth year] DOA: 01/02/2021 PCP: Monico Blitz, MD   Brief History:  85 year old male with a history of hypertension, bladder cancer status post cystoprostatectomy with ileal conduit, anxiety presenting with at least 3 days of abdominal pain isolated to the right upper quadrant and epigastric area.  Patient initially presented to the emergency department on 12/30/2020.  CT of the abdomen and pelvis was performed at that time and showed a distended gallbladder with a gallbladder neck stone.  There was mild diffuse gallbladder wall thickening or pericholecystic soft tissue stranding.  The patient was treated symptomatically and discharged home with follow-up with general surgery (Dr. Arnoldo Morale).  This follow-up was supposed to be on 01/06/2021.  However, the patient continued to have intermittent right upper quadrant pain since his ED visit.   Prior to coming to the hospital he had a meal of fried fish, cake, and a shot of whiskey which induced intense right upper quadrant pain that would bring him to his knees and radiated down his right side.   In the ED, the patient had a fever up to 103.9 F with tachycardia 100-110.  He was hemodynamically stable with oxygen saturation 94% room air.  BMP showed sodium 139, potassium 3.7, creatinine 1.42.  LFTs were unremarkable.  Lipase was 18.  WBC 12.6, hemoglobin 12.3, platelets 246,000.  Lactic acid was 1.0.  Chest x-ray showed low volumes with bibasilar atelectasis.  CT of the brain was negative.  General surgery was consulted to assist with management.   Assessment/Plan: Sepsis -Present on admission -due to cholecystitis -Presented with fever, tachycardia, and leukocytosis -Follow blood cultures -Urine cultures of uncertain significance given they were collected from the patient's ileal conduit -Continue cefepime and metronidazole -Patient continues to be febrile up to 103.2 on 11/5 -Personally  reviewed chest x-ray--no infiltrates or consolidations -blood cultures remain neg   Acute cholecystitis -Appreciate general Surgery consult -Case discussed with Dr. Arnoldo Morale -Continue cefepime and metronidazole -Continue IV fluids -01/02/2021 RUQ US--borderline dilated GB, prominent sludge, areas of wall thickening, no CBD dilatation.  Bacteruria -dubious clinical significance as urine is collected from ileal conduit   Essential hypertension -Holding ARB for now as the patient is normotensive   Anxiety -Continue home dose lorazepam   Hyponatremia -due to volume depletion -continue IV NS       Status is: Inpatient   Remains inpatient appropriate because: Severity of illness requiring IV antibiotics and possible surgical intervention               Family Communication:   spouse updated at bedside 11/6   Consultants: General surgery   Code Status:  FULL   DVT Prophylaxis:  [place] Heparin      Procedures: As Listed in Progress Note Above   Antibiotics: Cefepime 11/5>> Flagyl 11/5>>   Subjective: Overall abdominal pain is a little better, but still present.  Denies f/c, cp, sob, vomiting, diarrhea;   complains of constipation.  No headache or neck pain  Objective: Vitals:   01/03/21 1854 01/03/21 2111 01/04/21 0156 01/04/21 0539  BP:  113/62 (!) 133/56 130/72  Pulse:  77 85 85  Resp:  18  19  Temp: 99 F (37.2 C) 99.4 F (37.4 C) 100 F (37.8 C) 99.5 F (37.5 C)  TempSrc: Oral Oral Oral Oral  SpO2:  96%  96%  Weight:      Height:  Intake/Output Summary (Last 24 hours) at 01/04/2021 0751 Last data filed at 01/04/2021 0500 Gross per 24 hour  Intake 2195.74 ml  Output 1250 ml  Net 945.74 ml   Weight change:  Exam:  General:  Pt is alert, follows commands appropriately, not in acute distress HEENT: No icterus, No thrush, No neck mass, [place]/AT Cardiovascular: RRR, S1/S2, no rubs, no gallops Respiratory: CTA bilaterally, no wheezing, no crackles,  no rhonchi Abdomen: Soft/+BS, RUQ/epigastric  tender, non distended, no guarding Extremities: No edema, No lymphangitis, No petechiae, No rashes, no synovitis   Data Reviewed: I have personally reviewed following labs and imaging studies Basic Metabolic Panel: Recent Labs  Lab 12/30/20 1030 01/02/21 1454 01/03/21 0457 01/04/21 0433  NA 132* 129* 132* 133*  K 3.9 3.7 3.9 3.5  CL 98 98 102 103  CO2 23 21* 21* 22  GLUCOSE 189* 160* 176* 196*  BUN 14 33* 27* 23  CREATININE 1.02 1.42* 1.17 1.03  CALCIUM 9.1 8.3* 8.1* 8.1*   Liver Function Tests: Recent Labs  Lab 12/30/20 1030 01/02/21 1454 01/03/21 0457 01/04/21 0433  AST 16 15 15 23   ALT 18 18 17 22   ALKPHOS 70 77 69 66  BILITOT 1.1 1.5* 1.3* 1.1  PROT 8.3* 6.6 5.8* 5.6*  ALBUMIN 4.3 2.9* 2.6* 2.3*   Recent Labs  Lab 12/30/20 1030 01/02/21 1454  LIPASE 23 18   No results for input(s): AMMONIA in the last 168 hours. Coagulation Profile: Recent Labs  Lab 01/02/21 1550  INR 1.1   CBC: Recent Labs  Lab 12/30/20 1030 01/02/21 1454 01/03/21 0457 01/04/21 0433  WBC 15.2* 12.6* 10.2 11.1*  NEUTROABS  --  10.4*  --   --   HGB 15.2 12.3* 12.4* 11.2*  HCT 44.2 35.6* 36.5* 32.8*  MCV 95.3 94.9 95.3 94.5  PLT 272 246 243 204   Cardiac Enzymes: No results for input(s): CKTOTAL, CKMB, CKMBINDEX, TROPONINI in the last 168 hours. BNP: Invalid input(s): POCBNP CBG: No results for input(s): GLUCAP in the last 168 hours. HbA1C: No results for input(s): HGBA1C in the last 72 hours. Urine analysis:    Component Value Date/Time   COLORURINE YELLOW 01/02/2021 1454   APPEARANCEUR HAZY (A) 01/02/2021 1454   LABSPEC 1.012 01/02/2021 1454   PHURINE 9.0 (H) 01/02/2021 1454   GLUCOSEU NEGATIVE 01/02/2021 1454   HGBUR NEGATIVE 01/02/2021 1454   BILIRUBINUR NEGATIVE 01/02/2021 1454   KETONESUR NEGATIVE 01/02/2021 1454   PROTEINUR 100 (A) 01/02/2021 1454   UROBILINOGEN 0.2 04/21/2012 0200   NITRITE POSITIVE (A)  01/02/2021 1454   LEUKOCYTESUR TRACE (A) 01/02/2021 1454   Sepsis Labs: @LABRCNTIP (procalcitonin:4,lacticidven:4) ) Recent Results (from the past 240 hour(s))  Urine Culture     Status: Abnormal   Collection Time: 12/30/20 12:03 PM   Specimen: Urine, Clean Catch  Result Value Ref Range Status   Specimen Description   Final    URINE, CLEAN CATCH Performed at [facility], [address] [place], [place] [address]    Special Requests   Final    NONE Performed at [facility], [address], [place], [place] [address]    Culture (A)  Final    >=100,000 COLONIES/mL PROTEUS MIRABILIS >=100,000 COLONIES/mL KLEBSIELLA OXYTOCA    Report Status 01/02/2021 FINAL  Final   Organism ID, Bacteria PROTEUS MIRABILIS (A)  Final   Organism ID, Bacteria KLEBSIELLA OXYTOCA (A)  Final      Susceptibility   Klebsiella oxytoca - MIC*    AMPICILLIN RESISTANT Resistant  CEFAZOLIN 16 SENSITIVE Sensitive     CEFEPIME <=0.12 SENSITIVE Sensitive     CEFTRIAXONE <=0.25 SENSITIVE Sensitive     CIPROFLOXACIN <=0.25 SENSITIVE Sensitive     GENTAMICIN <=1 SENSITIVE Sensitive     IMIPENEM <=0.25 SENSITIVE Sensitive     NITROFURANTOIN <=16 SENSITIVE Sensitive     TRIMETH/SULFA <=20 SENSITIVE Sensitive     AMPICILLIN/SULBACTAM 4 SENSITIVE Sensitive     PIP/TAZO <=4 SENSITIVE Sensitive     * >=100,000 COLONIES/mL KLEBSIELLA OXYTOCA   Proteus mirabilis - MIC*    AMPICILLIN <=2 SENSITIVE Sensitive     CEFAZOLIN <=4 SENSITIVE Sensitive     CEFEPIME <=0.12 SENSITIVE Sensitive     CEFTRIAXONE <=0.25 SENSITIVE Sensitive     CIPROFLOXACIN <=0.25 SENSITIVE Sensitive     GENTAMICIN <=1 SENSITIVE Sensitive     IMIPENEM <=0.25 SENSITIVE Sensitive     NITROFURANTOIN RESISTANT Resistant     TRIMETH/SULFA <=20 SENSITIVE Sensitive     AMPICILLIN/SULBACTAM <=2 SENSITIVE Sensitive     PIP/TAZO <=4 SENSITIVE Sensitive     * >=100,000 COLONIES/mL PROTEUS MIRABILIS  Resp Panel by RT-PCR (Flu A&B, Covid) Urine, Clean  Catch     Status: None   Collection Time: 01/02/21  2:54 PM   Specimen: Urine, Clean Catch; Nasopharyngeal(NP) swabs in vial transport medium  Result Value Ref Range Status   SARS Coronavirus 2 by RT PCR NEGATIVE NEGATIVE Final    Comment: (NOTE) SARS-CoV-2 target nucleic acids are NOT DETECTED.  The SARS-CoV-2 RNA is generally detectable in upper respiratory specimens during the acute phase of infection. The lowest concentration of SARS-CoV-2 viral copies this assay can detect is 138 copies/mL. A negative result does not preclude SARS-Cov-2 infection and should not be used as the sole basis for treatment or other patient management decisions. A negative result may occur with  improper specimen collection/handling, submission of specimen other than nasopharyngeal swab, presence of viral mutation(s) within the areas targeted by this assay, and inadequate number of viral copies(<138 copies/mL). A negative result must be combined with clinical observations, patient history, and epidemiological information. The expected result is Negative.  Fact Sheet for Patients:  EntrepreneurPulse.com.au  Fact Sheet for Healthcare Providers:  IncredibleEmployment.be  This test is no t yet approved or cleared by the [place] FDA and  has been authorized for detection and/or diagnosis of SARS-CoV-2 by FDA under an Emergency Use Authorization (EUA). This EUA will remain  in effect (meaning this test can be used) for the duration of the COVID-19 declaration under Section 564(b)(1) of the Act, 21 U.S.C.section 360bbb-3(b)(1), unless the authorization is terminated  or revoked sooner.       Influenza A by PCR NEGATIVE NEGATIVE Final   Influenza B by PCR NEGATIVE NEGATIVE Final    Comment: (NOTE) The Xpert Xpress SARS-CoV-2/FLU/RSV plus assay is intended as an aid in the diagnosis of influenza from Nasopharyngeal swab specimens and should not be used as a sole  basis for treatment. Nasal washings and aspirates are unacceptable for Xpert Xpress SARS-CoV-2/FLU/RSV testing.  Fact Sheet for Patients: EntrepreneurPulse.com.au  Fact Sheet for Healthcare Providers: IncredibleEmployment.be  This test is not yet approved or cleared by the [place] FDA and has been authorized for detection and/or diagnosis of SARS-CoV-2 by FDA under an Emergency Use Authorization (EUA). This EUA will remain in effect (meaning this test can be used) for the duration of the COVID-19 declaration under Section 564(b)(1) of the Act, 21 U.S.C. section 360bbb-3(b)(1), unless the authorization is terminated or revoked.  Performed at [facility], [address], [place], [place] [address]   Blood Culture (routine x 2)     Status: None (Preliminary result)   Collection Time: 01/02/21  3:45 PM   Specimen: BLOOD RIGHT ARM  Result Value Ref Range Status   Specimen Description BLOOD RIGHT ARM  Final   Special Requests   Final    Blood Culture adequate volume BOTTLES DRAWN AEROBIC AND ANAEROBIC   Culture   Final    NO GROWTH < 24 HOURS Performed at [facility], [address] [place], [place] [address]    Report Status PENDING  Incomplete  Blood Culture (routine x 2)     Status: None (Preliminary result)   Collection Time: 01/02/21  3:49 PM   Specimen: Right Antecubital; Blood  Result Value Ref Range Status   Specimen Description RIGHT ANTECUBITAL  Final   Special Requests   Final    Blood Culture results may not be optimal due to an excessive volume of blood received in culture bottles BOTTLES DRAWN AEROBIC AND ANAEROBIC   Culture   Final    NO GROWTH < 24 HOURS Performed at [facility], [address], [place], [place] [address]    Report Status PENDING  Incomplete     Scheduled Meds:  heparin  5,000 Units Subcutaneous Q8H   sodium chloride flush  3 mL Intravenous Q12H   Continuous Infusions:  sodium chloride 100 mL/hr at  01/03/21 1739   ceFEPime (MAXIPIME) IV 2 g (01/04/21 0054)   metronidazole 500 mg (01/04/21 0448)    Procedures/Studies: CT HEAD WO CONTRAST  Result Date: 01/02/2021 CLINICAL DATA:  Mental status change, unknown cause EXAM: CT HEAD WITHOUT CONTRAST TECHNIQUE: Contiguous axial images were obtained from the base of the skull through the vertex without intravenous contrast. COMPARISON:  2013 FINDINGS: Brain: There is no acute intracranial hemorrhage, mass effect, or edema. Gray-white differentiation is preserved. There is no extra-axial fluid collection. Prominence of the ventricles and sulci reflects parenchymal volume loss. Patchy hypoattenuation in the supratentorial white matter is nonspecific but probably reflects moderate chronic microvascular ischemic changes. Vascular: There is atherosclerotic calcification at the skull base. Skull: Calvarium is unremarkable. Sinuses/Orbits: No acute finding. Other: None. IMPRESSION: No acute intracranial abnormality. Chronic microvascular ischemic changes. Electronically Signed   By: Macy Mis M.D.   On: 01/02/2021 15:34   CT Abdomen Pelvis W Contrast  Result Date: 12/30/2020 CLINICAL DATA:  Right-sided abdominal pain and nausea for 3 days. Prior surgery for bladder carcinoma. EXAM: CT ABDOMEN AND PELVIS WITH CONTRAST TECHNIQUE: Multidetector CT imaging of the abdomen and pelvis was performed using the standard protocol following bolus administration of intravenous contrast. CONTRAST:  171mL OMNIPAQUE IOHEXOL 300 MG/ML  SOLN COMPARISON:  04/12/2017 from [facility] FINDINGS: Lower Chest: No acute findings. Hepatobiliary: No hepatic masses identified. Sub-cm cyst noted in the anterior right hepatic lobe. Distended gallbladder is seen with 4 mm gallstone in the gallbladder neck. Mild diffuse gallbladder wall thickening and pericholecystic soft tissue stranding are also seen, consistent with acute cholecystitis. No evidence of biliary ductal  dilatation. Pancreas:  No mass or inflammatory changes. Spleen: Within normal limits in size and appearance. Adrenals/Urinary Tract: No masses identified. Small bilateral renal cysts are noted. No evidence of ureteral calculi or hydronephrosis. Stable postop changes from cystectomy with ileal loop diversion. Stomach/Bowel: Increased size of small to moderate hiatal hernia. No evidence of obstruction, inflammatory process or abnormal fluid collections. Diverticulosis is seen mainly involving the descending and  sigmoid colon, however there is no evidence of diverticulitis. 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 Vascular/Lymphatic: No pathologically enlarged lymph nodes. No acute vascular findings. Aortic atherosclerotic calcification noted. Reproductive:  Stable postop changes from cystoprostatectomy. Other: 2 small left paraumbilical ventral hernias are again seen, both containing only fat and without significant change compared to prior exam. Musculoskeletal:  No suspicious bone lesions identified. IMPRESSION: Findings consistent with acute cholecystitis. No evidence of biliary obstruction or other complication. Stable postop changes from cystoprostatectomy with ileal loop diversion. No evidence of recurrent or metastatic carcinoma. Increased size of small to moderate hiatal hernia. Stable small left paraumbilical ventral hernias, which contain only fat. Colonic diverticulosis, without radiographic evidence of diverticulitis. Aortic Atherosclerosis (ICD10-I70.0). Electronically Signed   By: Marlaine Hind M.D.   On: 12/30/2020 11:55   DG Chest Port 1 View  Result Date: 01/02/2021 CLINICAL DATA:  Altered mental status, confusion, lethargy EXAM: PORTABLE CHEST 1 VIEW COMPARISON:  04/14/2018 FINDINGS: Lower lung volumes and elevated right hemidiaphragm. Increased streaky bibasilar opacities favored to be atelectasis, less likely pneumonia. Pleural thickening on the right. No large  effusion or pneumothorax. Trachea midline. Mild cardiac enlargement without CHF. Bones are osteopenic. No acute osseous finding. Aorta atherosclerotic. IMPRESSION: Lower lung volumes with elevated right hemidiaphragm. Associated increased basilar atelectasis. Electronically Signed   By: Jerilynn Mages.  Shick M.D.   On: 01/02/2021 15:02   US Abdomen Limited RUQ (LIVER/GB)  Result Date: 01/02/2021 CLINICAL DATA:  Questionable gallstones on CT EXAM: ULTRASOUND ABDOMEN LIMITED RIGHT UPPER QUADRANT COMPARISON:  Ultrasound 12/30/2020, CT 12/30/2020 FINDINGS: Gallbladder: The gallbladder is borderline dilated with prominent intraluminal sludge. There are areas of gallbladder wall thickening. The stone seen on recent CT is not visualized sonographically. Common bile duct: Diameter: 4.8 mm Liver: No focal lesion identified. Within normal limits in parenchymal echogenicity. Portal vein is patent on color Doppler imaging with normal direction of blood flow towards the liver. Other: None. IMPRESSION: Borderline dilated gallbladder with prominent intraluminal sludge and areas of gallbladder wall thickening. Gallstone seen on recent CT is not well seen sonographically. No common bile duct dilation. Given appearance on recent CT and ultrasound 3 days ago, findings are again concerning for acute cholecystitis. Correlate with exam and laboratory findings. Electronically Signed   By: Maurine Simmering M.D.   On: 01/02/2021 15:41   US Abdomen Limited RUQ (LIVER/GB)  Result Date: 12/30/2020 CLINICAL DATA:  Right upper quadrant abdominal pain and nausea for 3 days. Gallstone in the neck of the gallbladder on CT scan today. EXAM: ULTRASOUND ABDOMEN LIMITED RIGHT UPPER QUADRANT COMPARISON:  CT scan 12/30/2020 FINDINGS: Gallbladder: Mild gallbladder wall thickening varying between 3-5 mm. There is sludge in the gallbladder but the stone seen in the neck of the gallbladder on CT is difficult to visualize on today's ultrasound. Sonographic Percell Miller sign  is documented as being present. Trace pericholecystic fluid. Common bile duct: Diameter: 0.2 cm Liver: Equivocal accentuated hepatic echogenicity. No focal hepatic lesion identified. No intrahepatic biliary dilatation. Portal vein is patent on color Doppler imaging with normal direction of blood flow towards the liver. Other: None. IMPRESSION: 1. Gallbladder wall thickening with mild pericholecystic fluid and sonographic Murphy's sign present. The small stone in the neck of the gallbladder seen on CT is not as well appreciated on today's ultrasound. There is no CBD dilatation. Correlate clinically in assessing for acute cholecystitis. 2. Nonspecific and equivocal accentuated hepatic echogenicity diffusely, without focal hepatic lesion. Electronically Signed   By: Thayer Jew  Janeece Fitting M.D.   On: 12/30/2020 13:54    Orson Eva, DO  [facility]  If 7PM-7AM, please contact night-coverage [email or web address] Password [email or web address] 01/04/2021, 7:51 AM   LOS: 2 days

## 2021-01-04 NOTE — Plan of Care (Signed)
  Problem: Acute Rehab PT Goals(only PT should resolve) Goal: Pt Will Go Supine/Side To Sit Flowsheets (Taken 01/04/2021 1046) Pt will go Supine/Side to Sit: Independently Goal: Pt Will Go Sit To Supine/Side Flowsheets (Taken 01/04/2021 1046) Pt will go Sit to Supine/Side: with modified independence Goal: Patient Will Transfer Sit To/From Stand Flowsheets (Taken 01/04/2021 1046) Patient will transfer sit to/from stand: with modified independence Goal: Pt Will Ambulate Flowsheets (Taken 01/04/2021 1046) Pt will Ambulate:  50 feet  with modified independence  with rolling walker

## 2021-01-04 NOTE — Progress Notes (Signed)
Responded to nursing call:  fever, sob   Subjective: Arrive to evaluate patient at 1328 Patient complains of f/c, shivering.  No vomiting or diarrhea.  Has usual abd pain. And some sob  Vitals:   01/04/21 0539 01/04/21 0941 01/04/21 1245 01/04/21 1321  BP: 130/72 140/76  (!) 98/58  Pulse: 85 90  (!) 119  Resp: 19 20  (!) 34  Temp: 99.5 F (37.5 C) (!) 100.4 F (38 C) 99.5 F (37.5 C) (!) 102.5 F (39.2 C)  TempSrc: Oral Oral Oral Rectal  SpO2: 96% 95%  97%  Weight:      Height:       CV--RRR Lung--bibasilar crackles.  +upper airway wheeze Abd--soft+BS/RUQ tender.  No rebound   Assessment/Plan:  Fever -pt with appropriate physiologic response -11/4 blood culture neg -check lactic and PCT -repeat blood culture -CXR -given acetaminophen already -toradol x 1 LR bolus -spouse updated at beside -continue cefepime and metronidazole -add vanc    Orson Eva, DO [facility]

## 2021-01-04 NOTE — Progress Notes (Signed)
   01/04/21 1510  Vitals  Temp (!) 101.9 F (38.8 C)  Temp Source Rectal  BP (!) 110/59  MAP (mmHg) 74  BP Location Right Arm  BP Method Automatic  Patient Position (if appropriate) Lying  Pulse Rate 84  Pulse Rate Source Dinamap  Resp 20  Level of Consciousness  Level of Consciousness Alert  MEWS COLOR  MEWS Score Color Yellow  Oxygen Therapy  SpO2 94 %  O2 Device Room Air  Pain Assessment  Pain Scale 0-10  Pain Score 0  MEWS Score  MEWS Temp 2  MEWS Systolic 0  MEWS Pulse 0  MEWS RR 0  MEWS LOC 0  MEWS Score 2   Pt now sweating profusely, gown and bed linens wet. Pt bathed and bed linens changed. Pt states feeling a little better. VS as above. Second liter bolus LR started per order. Pt continues to tolerated po liquids without n/v. Wife remains at bedside.

## 2021-01-04 NOTE — Progress Notes (Signed)
Pharmacy Antibiotic Note  Benjamin Yang is a 85 y.o. male admitted on 01/02/2021 with sepsis.  Pharmacy has been consulted for Vancomycin dosing.  Plan: Vancomycin 1000 mg IV Q 12 hrs. Goal AUC 400-550. Expected AUC: 537 SCr used: 1.03   Height: 6\' 2"  (188 cm) Weight: 91 kg (200 lb 9.9 oz) IBW/kg (Calculated) : 82.2  Temp (24hrs), Avg:100.5 F (38.1 C), Min:98.8 F (37.1 C), Max:104 F (40 C)  Recent Labs  Lab 12/30/20 1030 01/02/21 1453 01/02/21 1454 01/03/21 0457 01/04/21 0433 01/04/21 1341 01/04/21 1658  WBC 15.2*  --  12.6* 10.2 11.1*  --   --   CREATININE 1.02  --  1.42* 1.17 1.03  --   --   LATICACIDVEN  --  1.0  --   --   --  2.6* 1.3    Estimated Creatinine Clearance: 63.2 mL/min (by C-G formula based on SCr of 1.03 mg/dL).    Allergies  Allergen Reactions   Percocet [Oxycodone-Acetaminophen]     Patient says he can take this for pain-just bothered his stomach a little bit before    Antimicrobials this admission: Vanc 11/6 >>  Cefepime 11/5 >>   Dose adjustments this admission:   Microbiology results:  BCx: pending  UCx: pending   Sputum: pending   MRSA PCR: pending  Thank you for allowing pharmacy to be a part of this patient's care.  Hart Robinsons A 01/04/2021 7:13 PM

## 2021-01-04 NOTE — Progress Notes (Signed)
   01/04/21 1410  Vitals  Temp (!) 104 F (40 C)  Temp Source Rectal  Level of Consciousness  Level of Consciousness Alert  MEWS COLOR  MEWS Score Color Yellow  MEWS Score  MEWS Temp 2  MEWS Systolic 0  MEWS Pulse 0  MEWS RR 0  MEWS LOC 0  MEWS Score 2   Pt's resp and HR improved, LR bolus 1051ml infusing per order. Pt no longer shivering, beginning to sweat. Tolerating oral liquids without n/v at this time. MD Tat notified of current VS>

## 2021-01-04 NOTE — Evaluation (Signed)
Physical Therapy Evaluation Patient Details Name: Benjamin Yang MRN: [id number] DOB: [birth year] Today's Date: 01/04/2021  History of Present Illness   PT admitted to hospital with complaint of abdominal pain 85 year old male with a history of hypertension, bladder cancer status post cystoprostatectomy with ileal conduit, anxiety presenting with at least 3 days of abdominal pain isolated to the right upper quadrant and epigastric area.  Patient initially presented to the emergency department on 12/30/2020.  CT of the abdomen and pelvis was performed at that time and showed a distended gallbladder with a gallbladder neck stone.  There was mild diffuse gallbladder wall thickening or pericholecystic soft tissue stranding.  The patient was treated symptomatically and discharged home with follow-up with general surgery (Dr. Arnoldo Morale).  This follow-up was supposed to be on 01/06/2021.  However, the patient continued to have intermittent right upper quadrant pain since his ED visit.   Prior to coming to the hospital he had a meal of fried fish, cake, and a shot of whiskey which induced intense right upper quadrant  Clinical Impression  PT states that his functioning level has been decreasing as his abdominal pain has been increasing.  Pt normally goes outside and walks daily with a cane.        Recommendations for follow up therapy are one component of a multi-disciplinary discharge planning process, led by the attending physician.  Recommendations may be updated based on patient status, additional functional criteria and insurance authorization.  Follow Up Recommendations Other (comment) (currently at SNF level; pt and wife wants as much therapy as possible to try and go home with [facility])    Assistance Recommended at Discharge Intermittent Supervision/Assistance  Functional Status Assessment Patient has had a recent decline in their functional status and demonstrates the ability to make significant improvements  in function in a reasonable and predictable amount of time.  Equipment Recommendations  None recommended by PT    Recommendations for Other Services       Precautions / Restrictions Precautions Precautions: Fall      Mobility  Bed Mobility Overal bed mobility: Needs Assistance Bed Mobility: Supine to Sit;Sit to Supine     Supine to sit: Modified independent (Device/Increase time) Sit to supine: Min assist   General bed mobility comments: needs assist getting legs back into bed    Transfers Overall transfer level:  (pt stated he was not up to it today but wants to complete tomorrow.)                            Pertinent Vitals/Pain Pain Assessment: 0-10 Pain Score: 4  Pain Location: stomach Pain Descriptors / Indicators: Aching Pain Intervention(s): Limited activity within patient's tolerance    Home Living Family/patient expects to be discharged to:: Private residence Living Arrangements: Spouse/significant other Available Help at Discharge: Family Type of Home: House Home Access: Level entry       Home Layout: One level Home Equipment: [place] - single [employer]ista (2 wheels)      Prior Function Prior Level of Function : Independent/Modified Independent             Mobility Comments: up until a week ago pt was walking daily with a cane.  Recently purchased a RW due to decreased abilty to ambulate.       Hand Dominance        Extremity/Trunk Assessment        Lower Extremity Assessment Lower Extremity Assessment: Generalized  weakness       Communication   Communication: No difficulties  Cognition Arousal/Alertness: Awake/alert Behavior During Therapy: WFL for tasks assessed/performed Overall Cognitive Status: Within Functional Limits for tasks assessed                                          General Comments      Exercises General Exercises - Lower Extremity Ankle Circles/Pumps: Both;5 reps Quad  Sets: Both;5 reps Long Arc Quad: Both;5 reps;Seated Heel Slides: Both;5 reps Hip ABduction/ADduction: Both;5 reps   Assessment/Plan    PT Assessment Patient needs continued PT services  PT Problem List Decreased strength;Decreased activity tolerance;Decreased balance;Pain       PT Treatment Interventions Functional mobility training;Therapeutic exercise;Therapeutic activities    PT Goals (Current goals can be found in the Care Plan section)  Acute Rehab PT Goals Patient Stated Goal: Pt would like to work hard with therapy and go home. PT Goal Formulation: With patient    Frequency Min 4X/week   Barriers to discharge           AM-PAC PT "6 Clicks" Mobility  Outcome Measure Help needed turning from your back to your side while in a flat bed without using bedrails?: None Help needed moving from lying on your back to sitting on the side of a flat bed without using bedrails?: A Little Help needed moving to and from a bed to a chair (including a wheelchair)?: A Lot Help needed standing up from a chair using your arms (e.g., wheelchair or bedside chair)?: A Lot Help needed to walk in hospital room?: A Lot Help needed climbing 3-5 steps with a railing? : A Lot 6 Click Score: 15    End of Session   Activity Tolerance: Patient limited by fatigue Patient left: in bed;with call bell/phone within reach;with family/visitor present Nurse Communication: Mobility status PT Visit Diagnosis: Unsteadiness on feet (R26.81);Muscle weakness (generalized) (M62.81)    Time: 8889-1694 PT Time Calculation (min) (ACUTE ONLY): 40 min   Charges:   PT Evaluation $PT Eval Low Complexity: 1 Low PT Treatments $Therapeutic Activity: 8-22 mins       Rayetta Humphrey, PT CLT [phone]  01/04/2021, 10:47 AM

## 2021-01-05 ENCOUNTER — Inpatient Hospital Stay (HOSPITAL_COMMUNITY): Payer: Medicare Other

## 2021-01-05 DIAGNOSIS — R6520 Severe sepsis without septic shock: Secondary | ICD-10-CM | POA: Diagnosis not present

## 2021-01-05 DIAGNOSIS — K810 Acute cholecystitis: Secondary | ICD-10-CM | POA: Diagnosis not present

## 2021-01-05 DIAGNOSIS — A419 Sepsis, unspecified organism: Secondary | ICD-10-CM | POA: Diagnosis not present

## 2021-01-05 LAB — COMPREHENSIVE METABOLIC PANEL
ALT: 21 U/L (ref 0–44)
AST: 19 U/L (ref 15–41)
Albumin: 2.1 g/dL — ABNORMAL LOW (ref 3.5–5.0)
Alkaline Phosphatase: 67 U/L (ref 38–126)
Anion gap: 8 (ref 5–15)
BUN: 22 mg/dL (ref 8–23)
CO2: 22 mmol/L (ref 22–32)
Calcium: 7.9 mg/dL — ABNORMAL LOW (ref 8.9–10.3)
Chloride: 104 mmol/L (ref 98–111)
Creatinine, Ser: 1.01 mg/dL (ref 0.61–1.24)
GFR, Estimated: >60 mL/min (ref >60)
Glucose, Bld: 130 mg/dL — ABNORMAL HIGH (ref 70–99)
Potassium: 3.4 mmol/L — ABNORMAL LOW (ref 3.5–5.1)
Sodium: 134 mmol/L — ABNORMAL LOW (ref 135–145)
Total Bilirubin: 0.9 mg/dL (ref 0.3–1.2)
Total Protein: 5.3 g/dL — ABNORMAL LOW (ref 6.5–8.1)

## 2021-01-05 LAB — CBC
HCT: 31.7 % — ABNORMAL LOW (ref 39.0–52.0)
Hemoglobin: 10.5 g/dL — ABNORMAL LOW (ref 13.0–17.0)
MCH: 31.7 pg (ref 26.0–34.0)
MCHC: 33.1 g/dL (ref 30.0–36.0)
MCV: 95.8 fL (ref 80.0–100.0)
Platelets: 183 10*3/uL (ref 150–400)
RBC: 3.31 MIL/uL — ABNORMAL LOW (ref 4.22–5.81)
RDW: 13.9 % (ref 11.5–15.5)
WBC: 12.5 10*3/uL — ABNORMAL HIGH (ref 4.0–10.5)
nRBC: 0 % (ref 0.0–0.2)

## 2021-01-05 LAB — MAGNESIUM: Magnesium: 1.9 mg/dL (ref 1.7–2.4)

## 2021-01-05 LAB — URINE CULTURE: Culture: >=100000 — AB

## 2021-01-05 MED ORDER — POTASSIUM CHLORIDE 10 MEQ/100ML IV SOLN
10.0000 meq | INTRAVENOUS | Status: AC
  Administered 2021-01-05 (×3): 10 meq via INTRAVENOUS
  Filled 2021-01-05 (×3): qty 100

## 2021-01-05 MED ORDER — CHLORHEXIDINE GLUCONATE CLOTH 2 % EX PADS: 6.0000 | MEDICATED_PAD | Freq: Once | CUTANEOUS | Status: DC

## 2021-01-05 MED ORDER — TECHNETIUM TC 99M MEBROFENIN IV KIT
5.0000 | PACK | Freq: Once | INTRAVENOUS | Status: AC | PRN
  Administered 2021-01-05: 5 via INTRAVENOUS

## 2021-01-05 NOTE — Anesthesia Preprocedure Evaluation (Addendum)
Anesthesia Evaluation  Patient identified by MRN, date of birth, ID band Patient awake    Reviewed: Allergy & Precautions, NPO status , Patient's Chart, lab work & pertinent test results  Airway Mallampati: III  TM Distance: >3 FB Neck ROM: Full    Dental  (+) Upper Dentures, Lower Dentures   Pulmonary pneumonia,    Pulmonary exam normal breath sounds clear to auscultation       Cardiovascular Exercise Tolerance: Good hypertension, Pt. on medications Normal cardiovascular exam Rhythm:Regular Rate:Normal  02-Jan-2021 14:29:00 [facility] ROUTINE RECORD 1935-08-10 (85 yr) Male Caucasian Vent. rate 103 BPM PR interval 187 ms QRS duration 84 ms QT/QTcB 309/405 ms P-R-T axes -47 -33 75 Sinus or ectopic atrial tachycardia Left axis deviation RSR' in V1 or V2, probably normal variant Borderline T abnormalities, lateral leads Baseline wander in lead(s) V6   Neuro/Psych negative neurological ROS  negative psych ROS   GI/Hepatic negative GI ROS, (+)     substance abuse  alcohol use, Cholecystitis    Endo/Other  negative endocrine ROS  Renal/GU Renal InsufficiencyRenal disease     Musculoskeletal negative musculoskeletal ROS (+)   Abdominal   Peds  Hematology negative hematology ROS (+)   Anesthesia Other Findings   Reproductive/Obstetrics negative OB ROS                            Anesthesia Physical Anesthesia Plan  ASA: 3  Anesthesia Plan: General   Post-op Pain Management:    Induction: Intravenous, Rapid sequence and Cricoid pressure planned  PONV Risk Score and Plan: 4 or greater and Ondansetron and Dexamethasone  Airway Management Planned: Oral ETT  Additional Equipment:   Intra-op Plan:   Post-operative Plan: Extubation in OR  Informed Consent: I have reviewed the patients History and Physical, chart, labs and discussed the procedure including  the risks, benefits and alternatives for the proposed anesthesia with the patient or authorized representative who has indicated his/her understanding and acceptance.     Dental advisory given  Plan Discussed with: CRNA and Surgeon  Anesthesia Plan Comments:       Anesthesia Quick Evaluation

## 2021-01-05 NOTE — H&P (View-Only) (Signed)
HIDA scan shows no filling of the gallbladder even after delayed films were taken.  We will proceed with laparoscopic cholecystectomy tomorrow.  The risks and benefits of the procedure including bleeding, infection, hepatobiliary injury, cardiopulmonary difficulties, and the possibility of an open procedure were fully explained to the patient, who gave informed consent.

## 2021-01-05 NOTE — Progress Notes (Signed)
HIDA scan shows no filling of the gallbladder even after delayed films were taken.  We will proceed with laparoscopic cholecystectomy tomorrow.  The risks and benefits of the procedure including bleeding, infection, hepatobiliary injury, cardiopulmonary difficulties, and the possibility of an open procedure were fully explained to the patient, who gave informed consent.

## 2021-01-05 NOTE — Progress Notes (Signed)
PROGRESS NOTE  Benjamin Yang PPI:[id number] DOB: 08/06/1935 DOA: 01/02/2021 PCP: Monico Blitz, MD  Brief History:  85 year old male with a history of hypertension, bladder cancer status post cystoprostatectomy with ileal conduit, anxiety presenting with at least 3 days of abdominal pain isolated to the right upper quadrant and epigastric area.  Patient initially presented to the emergency department on 12/30/2020.  CT of the abdomen and pelvis was performed at that time and showed a distended gallbladder with a gallbladder neck stone.  There was mild diffuse gallbladder wall thickening or pericholecystic soft tissue stranding.  The patient was treated symptomatically and discharged home with follow-up with general surgery (Dr. Arnoldo Morale).  This follow-up was supposed to be on 01/06/2021.  However, the patient continued to have intermittent right upper quadrant pain since his ED visit.   Prior to coming to the hospital he had a meal of fried fish, cake, and a shot of whiskey which induced intense right upper quadrant pain that would bring him to his knees and radiated down his right side.   In the ED, the patient had a fever up to 103.9 F with tachycardia 100-110.  He was hemodynamically stable with oxygen saturation 94% room air.  BMP showed sodium 139, potassium 3.7, creatinine 1.42.  LFTs were unremarkable.  Lipase was 18.  WBC 12.6, hemoglobin 12.3, platelets 246,000.  Lactic acid was 1.0.  Chest x-ray showed low volumes with bibasilar atelectasis.  CT of the brain was negative.  General surgery was consulted to assist with management.   Assessment/Plan: Sepsis -Present on admission -due to cholecystitis -Presented with fever, tachycardia, and leukocytosis -Follow blood cultures -Urine cultures of uncertain significance given they were collected from the patient's ileal conduit -Continue cefepime and metronidazole -Patient continues to be febrile up to 103.2 on 11/5 -Personally  reviewed chest x-ray--no infiltrates or consolidations -blood cultures remain neg -11/6 spike another fever with elevated lactate>>2.5 L fluid and started vanc -11/6--personally reviewed CXR--bibasilar atelectasis   Acute cholecystitis -Appreciate general Surgery consult -Continue cefepime and metronidazole -Continue IV fluids -01/02/2021 RUQ US--borderline dilated GB, prominent sludge, areas of wall thickening, no CBD dilatation. -11/7 HIDA--Failure of the gallbladder to visualize at 4 hours consistent with cystic duct obstruction and acute cholecystitis -planning cholecystectomy 11/8 -discussed with Dr. Arnoldo Morale 11/7   Bacteruria -dubious clinical significance as urine is collected from ileal conduit   Essential hypertension -Holding ARB for now as the patient is normotensive   Anxiety -Continue home dose lorazepam    Hyponatremia -due to volume depletion -continue IV NS  Hypokalemia -replete -check mag       Status is: Inpatient   Remains inpatient appropriate because: Severity of illness requiring IV antibiotics and possible surgical intervention               Family Communication:   spouse updated at bedside 11/7   Consultants: General surgery   Code Status:  FULL   DVT Prophylaxis:  [place] Heparin      Procedures: As Listed in Progress Note Above   Antibiotics: Cefepime 11/5>> Flagyl 11/5>> -vanc 11/6>>      Subjective: Patient denies fevers, chills, headache, chest pain, dyspnea, nausea, vomiting, diarrhea, abdominal pain, dysuria, hematuria, hematochezia, and melena.   Objective: Vitals:   01/04/21 2112 01/04/21 2118 01/05/21 0601 01/05/21 0744  BP: 126/72  126/80 (!) 141/80  Pulse: 72  73 73  Resp: (!) 22  20 20   Temp: 97.9 F (36.6  C) 97.8 F (36.6 C) 98.1 F (36.7 C) 98.2 F (36.8 C)  TempSrc: Oral Rectal Oral Oral  SpO2: 96%  95% 97%  Weight:      Height:        Intake/Output Summary (Last 24 hours) at 01/05/2021 1707 Last  data filed at 01/05/2021 1526 Gross per 24 hour  Intake 3379.42 ml  Output 1990 ml  Net 1389.42 ml   Weight change:  Exam:  General:  Pt is alert, follows commands appropriately, not in acute distress HEENT: No icterus, No thrush, No neck mass, [place]/AT Cardiovascular: RRR, S1/S2, no rubs, no gallops Respiratory: bibasilar crackles.  No wheeze Abdomen: Soft/+BS, non tender, non distended, no guarding Extremities: No edema, No lymphangitis, No petechiae, No rashes, no synovitis   Data Reviewed: I have personally reviewed following labs and imaging studies Basic Metabolic Panel: Recent Labs  Lab 12/30/20 1030 01/02/21 1454 01/03/21 0457 01/04/21 0433 01/05/21 0421  NA 132* 129* 132* 133* 134*  K 3.9 3.7 3.9 3.5 3.4*  CL 98 98 102 103 104  CO2 23 21* 21* 22 22  GLUCOSE 189* 160* 176* 196* 130*  BUN 14 33* 27* 23 22  CREATININE 1.02 1.42* 1.17 1.03 1.01  CALCIUM 9.1 8.3* 8.1* 8.1* 7.9*  MG  --   --   --   --  1.9   Liver Function Tests: Recent Labs  Lab 12/30/20 1030 01/02/21 1454 01/03/21 0457 01/04/21 0433 01/05/21 0421  AST 16 15 15 23 19   ALT 18 18 17 22 21   ALKPHOS 70 77 69 66 67  BILITOT 1.1 1.5* 1.3* 1.1 0.9  PROT 8.3* 6.6 5.8* 5.6* 5.3*  ALBUMIN 4.3 2.9* 2.6* 2.3* 2.1*   Recent Labs  Lab 12/30/20 1030 01/02/21 1454  LIPASE 23 18   No results for input(s): AMMONIA in the last 168 hours. Coagulation Profile: Recent Labs  Lab 01/02/21 1550  INR 1.1   CBC: Recent Labs  Lab 12/30/20 1030 01/02/21 1454 01/03/21 0457 01/04/21 0433 01/05/21 0421  WBC 15.2* 12.6* 10.2 11.1* 12.5*  NEUTROABS  --  10.4*  --   --   --   HGB 15.2 12.3* 12.4* 11.2* 10.5*  HCT 44.2 35.6* 36.5* 32.8* 31.7*  MCV 95.3 94.9 95.3 94.5 95.8  PLT 272 246 243 204 183   Cardiac Enzymes: No results for input(s): CKTOTAL, CKMB, CKMBINDEX, TROPONINI in the last 168 hours. BNP: Invalid input(s): POCBNP CBG: No results for input(s): GLUCAP in the last 168 hours. HbA1C: No  results for input(s): HGBA1C in the last 72 hours. Urine analysis:    Component Value Date/Time   COLORURINE YELLOW 01/02/2021 1454   APPEARANCEUR HAZY (A) 01/02/2021 1454   LABSPEC 1.012 01/02/2021 1454   PHURINE 9.0 (H) 01/02/2021 1454   GLUCOSEU NEGATIVE 01/02/2021 1454   HGBUR NEGATIVE 01/02/2021 1454   BILIRUBINUR NEGATIVE 01/02/2021 1454   KETONESUR NEGATIVE 01/02/2021 1454   PROTEINUR 100 (A) 01/02/2021 1454   UROBILINOGEN 0.2 04/21/2012 0200   NITRITE POSITIVE (A) 01/02/2021 1454   LEUKOCYTESUR TRACE (A) 01/02/2021 1454   Sepsis Labs: @LABRCNTIP (procalcitonin:4,lacticidven:4) ) Recent Results (from the past 240 hour(s))  Urine Culture     Status: Abnormal   Collection Time: 12/30/20 12:03 PM   Specimen: Urine, Clean Catch  Result Value Ref Range Status   Specimen Description   Final    URINE, CLEAN CATCH Performed at [facility], [address], [place], [place] [address]    Special Requests   Final  NONE Performed at [facility], [address], [place], [place] [address]    Culture (A)  Final    >=100,000 COLONIES/mL PROTEUS MIRABILIS >=100,000 COLONIES/mL KLEBSIELLA OXYTOCA    Report Status 01/02/2021 FINAL  Final   Organism ID, Bacteria PROTEUS MIRABILIS (A)  Final   Organism ID, Bacteria KLEBSIELLA OXYTOCA (A)  Final      Susceptibility   Klebsiella oxytoca - MIC*    AMPICILLIN RESISTANT Resistant     CEFAZOLIN 16 SENSITIVE Sensitive     CEFEPIME <=0.12 SENSITIVE Sensitive     CEFTRIAXONE <=0.25 SENSITIVE Sensitive     CIPROFLOXACIN <=0.25 SENSITIVE Sensitive     GENTAMICIN <=1 SENSITIVE Sensitive     IMIPENEM <=0.25 SENSITIVE Sensitive     NITROFURANTOIN <=16 SENSITIVE Sensitive     TRIMETH/SULFA <=20 SENSITIVE Sensitive     AMPICILLIN/SULBACTAM 4 SENSITIVE Sensitive     PIP/TAZO <=4 SENSITIVE Sensitive     * >=100,000 COLONIES/mL KLEBSIELLA OXYTOCA   Proteus mirabilis - MIC*    AMPICILLIN <=2 SENSITIVE Sensitive     CEFAZOLIN <=4 SENSITIVE  Sensitive     CEFEPIME <=0.12 SENSITIVE Sensitive     CEFTRIAXONE <=0.25 SENSITIVE Sensitive     CIPROFLOXACIN <=0.25 SENSITIVE Sensitive     GENTAMICIN <=1 SENSITIVE Sensitive     IMIPENEM <=0.25 SENSITIVE Sensitive     NITROFURANTOIN RESISTANT Resistant     TRIMETH/SULFA <=20 SENSITIVE Sensitive     AMPICILLIN/SULBACTAM <=2 SENSITIVE Sensitive     PIP/TAZO <=4 SENSITIVE Sensitive     * >=100,000 COLONIES/mL PROTEUS MIRABILIS  Resp Panel by RT-PCR (Flu A&B, Covid) Urine, Clean Catch     Status: None   Collection Time: 01/02/21  2:54 PM   Specimen: Urine, Clean Catch; Nasopharyngeal(NP) swabs in vial transport medium  Result Value Ref Range Status   SARS Coronavirus 2 by RT PCR NEGATIVE NEGATIVE Final    Comment: (NOTE) SARS-CoV-2 target nucleic acids are NOT DETECTED.  The SARS-CoV-2 RNA is generally detectable in upper respiratory specimens during the acute phase of infection. The lowest concentration of SARS-CoV-2 viral copies this assay can detect is 138 copies/mL. A negative result does not preclude SARS-Cov-2 infection and should not be used as the sole basis for treatment or other patient management decisions. A negative result may occur with  improper specimen collection/handling, submission of specimen other than nasopharyngeal swab, presence of viral mutation(s) within the areas targeted by this assay, and inadequate number of viral copies(<138 copies/mL). A negative result must be combined with clinical observations, patient history, and epidemiological information. The expected result is Negative.  Fact Sheet for Patients:  EntrepreneurPulse.com.au  Fact Sheet for Healthcare Providers:  IncredibleEmployment.be  This test is no t yet approved or cleared by the [place] FDA and  has been authorized for detection and/or diagnosis of SARS-CoV-2 by FDA under an Emergency Use Authorization (EUA). This EUA will remain  in effect  (meaning this test can be used) for the duration of the COVID-19 declaration under Section 564(b)(1) of the Act, 21 U.S.C.section 360bbb-3(b)(1), unless the authorization is terminated  or revoked sooner.       Influenza A by PCR NEGATIVE NEGATIVE Final   Influenza B by PCR NEGATIVE NEGATIVE Final    Comment: (NOTE) The Xpert Xpress SARS-CoV-2/FLU/RSV plus assay is intended as an aid in the diagnosis of influenza from Nasopharyngeal swab specimens and should not be used as a sole basis for treatment. Nasal washings and aspirates are unacceptable for Xpert Xpress SARS-CoV-2/FLU/RSV testing.  Fact Sheet for Patients: EntrepreneurPulse.com.au  Fact Sheet for Healthcare Providers: IncredibleEmployment.be  This test is not yet approved or cleared by the [place] FDA and has been authorized for detection and/or diagnosis of SARS-CoV-2 by FDA under an Emergency Use Authorization (EUA). This EUA will remain in effect (meaning this test can be used) for the duration of the COVID-19 declaration under Section 564(b)(1) of the Act, 21 U.S.C. section 360bbb-3(b)(1), unless the authorization is terminated or revoked.  Performed at [facility], [address] [place], [place] [address]   Blood Culture (routine x 2)     Status: None (Preliminary result)   Collection Time: 01/02/21  3:45 PM   Specimen: BLOOD RIGHT ARM  Result Value Ref Range Status   Specimen Description BLOOD RIGHT ARM  Final   Special Requests   Final    Blood Culture adequate volume BOTTLES DRAWN AEROBIC AND ANAEROBIC   Culture   Final    NO GROWTH 3 DAYS Performed at [facility], [address] [place], [place] [address]    Report Status PENDING  Incomplete  Blood Culture (routine x 2)     Status: None (Preliminary result)   Collection Time: 01/02/21  3:49 PM   Specimen: Right Antecubital; Blood  Result Value Ref Range Status   Specimen Description RIGHT ANTECUBITAL  Final    Special Requests   Final    Blood Culture results may not be optimal due to an excessive volume of blood received in culture bottles BOTTLES DRAWN AEROBIC AND ANAEROBIC   Culture   Final    NO GROWTH 3 DAYS Performed at [facility], [address] [place], [place] [address]    Report Status PENDING  Incomplete  Urine Culture     Status: Abnormal   Collection Time: 01/02/21  8:25 PM   Specimen: Urine, Clean Catch  Result Value Ref Range Status   Specimen Description   Final    URINE, CLEAN CATCH Performed at [facility], [address], [place], [place] [address]    Special Requests   Final    NONE Performed at [facility], [address] [place], [place] [address]    Culture >=100,000 COLONIES/mL PROTEUS MIRABILIS (A)  Final   Report Status 01/05/2021 FINAL  Final   Organism ID, Bacteria PROTEUS MIRABILIS (A)  Final      Susceptibility   Proteus mirabilis - MIC*    AMPICILLIN <=2 SENSITIVE Sensitive     CEFAZOLIN <=4 SENSITIVE Sensitive     CEFEPIME <=0.12 SENSITIVE Sensitive     CEFTRIAXONE <=0.25 SENSITIVE Sensitive     CIPROFLOXACIN <=0.25 SENSITIVE Sensitive     GENTAMICIN <=1 SENSITIVE Sensitive     IMIPENEM <=0.25 SENSITIVE Sensitive     NITROFURANTOIN RESISTANT Resistant     TRIMETH/SULFA <=20 SENSITIVE Sensitive     AMPICILLIN/SULBACTAM <=2 SENSITIVE Sensitive     PIP/TAZO <=4 SENSITIVE Sensitive     * >=100,000 COLONIES/mL PROTEUS MIRABILIS  Culture, blood (Routine X 2) w Reflex to ID Panel     Status: None (Preliminary result)   Collection Time: 01/04/21  1:41 PM   Specimen: Right Antecubital; Blood  Result Value Ref Range Status   Specimen Description RIGHT ANTECUBITAL  Final   Special Requests   Final    BOTTLES DRAWN AEROBIC AND ANAEROBIC Blood Culture adequate volume   Culture   Final    NO GROWTH < 24 HOURS Performed at [facility], [address], [place], [place] [address]  Report Status PENDING  Incomplete  Culture, blood (Routine X 2) w Reflex  to ID Panel     Status: None (Preliminary result)   Collection Time: 01/04/21  1:51 PM   Specimen: BLOOD RIGHT HAND  Result Value Ref Range Status   Specimen Description BLOOD RIGHT HAND  Final   Special Requests   Final    BOTTLES DRAWN AEROBIC AND ANAEROBIC Blood Culture results may not be optimal due to an excessive volume of blood received in culture bottles   Culture   Final    NO GROWTH < 24 HOURS Performed at [facility], [address], [place], [place] [address]    Report Status PENDING  Incomplete     Scheduled Meds:  heparin  5,000 Units Subcutaneous Q8H   sodium chloride flush  3 mL Intravenous Q12H   Continuous Infusions:  sodium chloride 75 mL/hr at 01/05/21 1526   ceFEPime (MAXIPIME) IV Stopped (01/05/21 1127)   metronidazole Stopped (01/05/21 0719)   potassium chloride 10 mEq (01/05/21 1656)   vancomycin Stopped (01/05/21 1336)    Procedures/Studies: CT HEAD WO CONTRAST  Result Date: 01/02/2021 CLINICAL DATA:  Mental status change, unknown cause EXAM: CT HEAD WITHOUT CONTRAST TECHNIQUE: Contiguous axial images were obtained from the base of the skull through the vertex without intravenous contrast. COMPARISON:  2013 FINDINGS: Brain: There is no acute intracranial hemorrhage, mass effect, or edema. Gray-white differentiation is preserved. There is no extra-axial fluid collection. Prominence of the ventricles and sulci reflects parenchymal volume loss. Patchy hypoattenuation in the supratentorial white matter is nonspecific but probably reflects moderate chronic microvascular ischemic changes. Vascular: There is atherosclerotic calcification at the skull base. Skull: Calvarium is unremarkable. Sinuses/Orbits: No acute finding. Other: None. IMPRESSION: No acute intracranial abnormality. Chronic microvascular ischemic changes. Electronically Signed   By: Macy Mis M.D.   On: 01/02/2021 15:34   CT Abdomen Pelvis W Contrast  Result Date: 12/30/2020 CLINICAL DATA:   Right-sided abdominal pain and nausea for 3 days. Prior surgery for bladder carcinoma. EXAM: CT ABDOMEN AND PELVIS WITH CONTRAST TECHNIQUE: Multidetector CT imaging of the abdomen and pelvis was performed using the standard protocol following bolus administration of intravenous contrast. CONTRAST:  151mL OMNIPAQUE IOHEXOL 300 MG/ML  SOLN COMPARISON:  04/12/2017 from [facility] FINDINGS: Lower Chest: No acute findings. Hepatobiliary: No hepatic masses identified. Sub-cm cyst noted in the anterior right hepatic lobe. Distended gallbladder is seen with 4 mm gallstone in the gallbladder neck. Mild diffuse gallbladder wall thickening and pericholecystic soft tissue stranding are also seen, consistent with acute cholecystitis. No evidence of biliary ductal dilatation. Pancreas:  No mass or inflammatory changes. Spleen: Within normal limits in size and appearance. Adrenals/Urinary Tract: No masses identified. Small bilateral renal cysts are noted. No evidence of ureteral calculi or hydronephrosis. Stable postop changes from cystectomy with ileal loop diversion. Stomach/Bowel: Increased size of small to moderate hiatal hernia. No evidence of obstruction, inflammatory process or abnormal fluid collections. Diverticulosis is seen mainly involving the descending and sigmoid colon, however there is no evidence of diverticulitis. 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 Vascular/Lymphatic: No pathologically enlarged lymph nodes. No acute vascular findings. Aortic atherosclerotic calcification noted. Reproductive:  Stable postop changes from cystoprostatectomy. Other: 2 small left paraumbilical ventral hernias are again seen, both containing only fat and without significant change compared to prior exam. Musculoskeletal:  No suspicious bone lesions identified. IMPRESSION: Findings consistent with acute cholecystitis. No evidence of biliary obstruction or other  complication. Stable  postop changes from cystoprostatectomy with ileal loop diversion. No evidence of recurrent or metastatic carcinoma. Increased size of small to moderate hiatal hernia. Stable small left paraumbilical ventral hernias, which contain only fat. Colonic diverticulosis, without radiographic evidence of diverticulitis. Aortic Atherosclerosis (ICD10-I70.0). Electronically Signed   By: Marlaine Hind M.D.   On: 12/30/2020 11:55   NM Hepato W/EF  Result Date: 01/05/2021 CLINICAL DATA:  RIGHT upper quadrant abdominal pain, nausea, and vomiting; thickened gallbladder by CT and ultrasound, gallstone at lower gallbladder segment by CT, question cholecystitis EXAM: NUCLEAR MEDICINE HEPATOBILIARY IMAGING TECHNIQUE: Sequential images of the abdomen were obtained out to 60 minutes following intravenous administration of radiopharmaceutical. RADIOPHARMACEUTICALS:  5 mCi Tc-44m  Choletec IV COMPARISON:  CT abdomen and pelvis 12/30/2020, ultrasound abdomen RIGHT upper quadrant 01/02/2021 FINDINGS: Normal tracer extraction from bloodstream indicating normal hepatocellular function. Excretion of tracer into biliary tree with visualization of small-bowel at 12 minutes. At 1 hour, gallbladder had not visualized, though a photopenic region is seen at the gallbladder fossa. Delayed image at 4 hours was performed, since patient has a narcotic allergy to oxycodone and morphine was not administered. Gallbladder failed to visualized on delayed imaging consistent with cystic duct obstruction and acute cholecystitis. IMPRESSION: Failure of the gallbladder to visualize at 4 hours consistent with cystic duct obstruction and acute cholecystitis. Note that patients who are prolonged NPO can have false-positive HIDA scans for cholecystitis. Electronically Signed   By: Lavonia Dana M.D.   On: 01/05/2021 13:23   DG CHEST PORT 1 VIEW  Result Date: 01/04/2021 CLINICAL DATA:  Shortness of breath.  Fever. EXAM: PORTABLE CHEST 1 VIEW COMPARISON:  January 02, 2021 FINDINGS: Cardiomediastinal silhouette is stable. No pneumothorax. A right-sided pleural effusion with underlying opacity remains. Left basilar opacity in the retrocardiac region remains. IMPRESSION: 1. Right pleural effusion with underlying opacity, possibly atelectasis. 2. Suspected retrocardiac opacity could represent atelectasis or infiltrate. 3. No other changes. Electronically Signed   By: Dorise Bullion III M.D.   On: 01/04/2021 14:51   DG Chest Port 1 View  Result Date: 01/02/2021 CLINICAL DATA:  Altered mental status, confusion, lethargy EXAM: PORTABLE CHEST 1 VIEW COMPARISON:  04/14/2018 FINDINGS: Lower lung volumes and elevated right hemidiaphragm. Increased streaky bibasilar opacities favored to be atelectasis, less likely pneumonia. Pleural thickening on the right. No large effusion or pneumothorax. Trachea midline. Mild cardiac enlargement without CHF. Bones are osteopenic. No acute osseous finding. Aorta atherosclerotic. IMPRESSION: Lower lung volumes with elevated right hemidiaphragm. Associated increased basilar atelectasis. Electronically Signed   By: Jerilynn Mages.  Shick M.D.   On: 01/02/2021 15:02   US Abdomen Limited RUQ (LIVER/GB)  Result Date: 01/02/2021 CLINICAL DATA:  Questionable gallstones on CT EXAM: ULTRASOUND ABDOMEN LIMITED RIGHT UPPER QUADRANT COMPARISON:  Ultrasound 12/30/2020, CT 12/30/2020 FINDINGS: Gallbladder: The gallbladder is borderline dilated with prominent intraluminal sludge. There are areas of gallbladder wall thickening. The stone seen on recent CT is not visualized sonographically. Common bile duct: Diameter: 4.8 mm Liver: No focal lesion identified. Within normal limits in parenchymal echogenicity. Portal vein is patent on color Doppler imaging with normal direction of blood flow towards the liver. Other: None. IMPRESSION: Borderline dilated gallbladder with prominent intraluminal sludge and areas of gallbladder wall thickening. Gallstone seen on recent CT is not  well seen sonographically. No common bile duct dilation. Given appearance on recent CT and ultrasound 3 days ago, findings are again concerning for acute cholecystitis. Correlate with exam and laboratory findings. Electronically Signed  By: Maurine Simmering M.D.   On: 01/02/2021 15:41   US Abdomen Limited RUQ (LIVER/GB)  Result Date: 12/30/2020 CLINICAL DATA:  Right upper quadrant abdominal pain and nausea for 3 days. Gallstone in the neck of the gallbladder on CT scan today. EXAM: ULTRASOUND ABDOMEN LIMITED RIGHT UPPER QUADRANT COMPARISON:  CT scan 12/30/2020 FINDINGS: Gallbladder: Mild gallbladder wall thickening varying between 3-5 mm. There is sludge in the gallbladder but the stone seen in the neck of the gallbladder on CT is difficult to visualize on today's ultrasound. Sonographic Percell Miller sign is documented as being present. Trace pericholecystic fluid. Common bile duct: Diameter: 0.2 cm Liver: Equivocal accentuated hepatic echogenicity. No focal hepatic lesion identified. No intrahepatic biliary dilatation. Portal vein is patent on color Doppler imaging with normal direction of blood flow towards the liver. Other: None. IMPRESSION: 1. Gallbladder wall thickening with mild pericholecystic fluid and sonographic Murphy's sign present. The small stone in the neck of the gallbladder seen on CT is not as well appreciated on today's ultrasound. There is no CBD dilatation. Correlate clinically in assessing for acute cholecystitis. 2. Nonspecific and equivocal accentuated hepatic echogenicity diffusely, without focal hepatic lesion. Electronically Signed   By: Van Clines M.D.   On: 12/30/2020 13:54    Orson Eva, DO  [facility]  If 7PM-7AM, please contact night-coverage [email or web address] Password [email or web address] 01/05/2021, 5:07 PM   LOS: 3 days

## 2021-01-05 NOTE — Progress Notes (Signed)
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  Patient initially presented to the emergency department on 12/30/2020.  CT of the abdomen and pelvis was performed at that time and showed a distended gallbladder with a gallbladder neck stone.  There was mild diffuse gallbladder wall thickening or pericholecystic soft tissue stranding.  The patient was treated symptomatically and discharged home with follow-up with general surgery (Dr. Arnoldo Morale).  This follow-up was supposed to be on 01/06/2021.  However, the patient continued to have intermittent right upper quadrant pain since his ED visit.   Prior to coming to the hospital he had a meal of fried fish, cake, and a shot of whiskey which induced intense right upper quadrant pain that would bring him to his knees and radiated down his right side.    PT Comments    Pt with improved mobility today, able to stand for 30 seconds prior to fatigue.     Recommendations for follow up therapy are one component of a multi-disciplinary discharge planning process, led by the attending physician.  Recommendations may be updated based on patient status, additional functional criteria and insurance authorization.  Follow Up Recommendations  Home health PT     Assistance Recommended at Discharge Intermittent Supervision/Assistance  Equipment Recommendations  None recommended by PT    Recommendations for Other Services       Precautions / Restrictions Precautions Precautions: Fall Restrictions Weight Bearing Restrictions: No     Mobility  Bed Mobility Overal bed mobility: Needs Assistance Bed Mobility: Supine to Sit;Sit to  Supine     Supine to sit: Modified independent (Device/Increase time) Sit to supine: Min assist   General bed mobility comments: needs assist getting legs back into bed    Transfers Overall transfer level: Needs assistance Equipment used: Rolling walker (2 wheels) Transfers: Sit to/from Stand Sit to Stand: Min assist           General transfer comment: to achieve balance able to stand bedside x 30 seconds x 3 with sidestepping to head of bed    Ambulation/Gait Ambulation/Gait assistance: Min guard Gait Distance (Feet): 4 Feet (side stepping) Assistive device: Rolling walker (2 wheels)             Stairs             Wheelchair Mobility    Modified Rankin (Stroke Patients Only)       Balance                                            Cognition Arousal/Alertness: Awake/alert Behavior During Therapy: WFL for tasks assessed/performed Overall Cognitive Status: Within Functional Limits for tasks assessed                                          Exercises General Exercises - Lower Extremity Ankle Circles/Pumps: Both;10 reps Quad Sets: Both;10 reps Long Arc Quad: Both;5 reps Heel Slides: Both;5 reps Hip ABduction/ADduction: Both;10 reps    General Comments  Pertinent Vitals/Pain Pain Score: 3  Pain Location: stomach Pain Descriptors / Indicators: Aching Pain Intervention(s): Limited activity within patient's tolerance    Home Living Family/patient expects to be discharged to:: Private residence Living Arrangements: Spouse/significant other Available Help at Discharge: Family Type of Home: House Home Access: Level entry       Home Layout: One level Home Equipment: [place] - single Barista (2 wheels)      Prior Function            PT Goals (current goals can now be found in the care plan section) Acute Rehab PT Goals Patient Stated Goal: Pt would like to work hard with therapy and  go home with [facility] PT Goal Formulation: With patient Time For Goal Achievement: 01/08/21 Potential to Achieve Goals: Good    Frequency    Min 4X/week      PT Plan  Continue acute PT          End of Session Equipment Utilized During Treatment: Gait belt Activity Tolerance: Patient limited by fatigue Patient left: in bed;with call bell/phone within reach;with family/visitor present Nurse Communication: Mobility status PT Visit Diagnosis: Unsteadiness on feet (R26.81);Muscle weakness (generalized) (M62.81)     Time: 5462-7035 PT Time Calculation (min) (ACUTE ONLY): 35 min  Charges:  $Therapeutic Exercise: 8-22 mins $Therapeutic Activity: 8-22 mins                     Rayetta Humphrey, PT CLT [phone]  01/05/2021, 3:18 PM

## 2021-01-06 ENCOUNTER — Encounter (HOSPITAL_COMMUNITY)
Admission: EM | Disposition: A | Discharge status: Home Health | Payer: Self-pay | Source: Home / Self Care | Attending: Internal Medicine

## 2021-01-06 ENCOUNTER — Inpatient Hospital Stay (HOSPITAL_COMMUNITY): Payer: Medicare Other | Admitting: Anesthesiology

## 2021-01-06 ENCOUNTER — Encounter (HOSPITAL_COMMUNITY): Payer: Self-pay | Admitting: Emergency Medicine

## 2021-01-06 ENCOUNTER — Ambulatory Visit: Payer: Medicare Other | Admitting: General Surgery

## 2021-01-06 DIAGNOSIS — R6520 Severe sepsis without septic shock: Secondary | ICD-10-CM | POA: Diagnosis not present

## 2021-01-06 DIAGNOSIS — A419 Sepsis, unspecified organism: Secondary | ICD-10-CM | POA: Diagnosis not present

## 2021-01-06 DIAGNOSIS — K810 Acute cholecystitis: Secondary | ICD-10-CM | POA: Diagnosis not present

## 2021-01-06 HISTORY — PX: CHOLECYSTECTOMY: SHX55

## 2021-01-06 LAB — MAGNESIUM: Magnesium: 1.7 mg/dL (ref 1.7–2.4)

## 2021-01-06 LAB — COMPREHENSIVE METABOLIC PANEL
ALT: 19 U/L (ref 0–44)
AST: 17 U/L (ref 15–41)
Albumin: 2.1 g/dL — ABNORMAL LOW (ref 3.5–5.0)
Alkaline Phosphatase: 76 U/L (ref 38–126)
Anion gap: 9 (ref 5–15)
BUN: 18 mg/dL (ref 8–23)
CO2: 19 mmol/L — ABNORMAL LOW (ref 22–32)
Calcium: 7.8 mg/dL — ABNORMAL LOW (ref 8.9–10.3)
Chloride: 107 mmol/L (ref 98–111)
Creatinine, Ser: 0.84 mg/dL (ref 0.61–1.24)
GFR, Estimated: >60 mL/min (ref >60)
Glucose, Bld: 133 mg/dL — ABNORMAL HIGH (ref 70–99)
Potassium: 3.6 mmol/L (ref 3.5–5.1)
Sodium: 135 mmol/L (ref 135–145)
Total Bilirubin: 1.2 mg/dL (ref 0.3–1.2)
Total Protein: 5.4 g/dL — ABNORMAL LOW (ref 6.5–8.1)

## 2021-01-06 LAB — CBC WITH DIFFERENTIAL/PLATELET
Abs Immature Granulocytes: 0.16 10*3/uL — ABNORMAL HIGH (ref 0.00–0.07)
Basophils Absolute: 0 10*3/uL (ref 0.0–0.1)
Basophils Relative: 0 %
Eosinophils Absolute: 0.4 10*3/uL (ref 0.0–0.5)
Eosinophils Relative: 3 %
HCT: 33.2 % — ABNORMAL LOW (ref 39.0–52.0)
Hemoglobin: 11.3 g/dL — ABNORMAL LOW (ref 13.0–17.0)
Immature Granulocytes: 1 %
Lymphocytes Relative: 6 %
Lymphs Abs: 0.6 10*3/uL — ABNORMAL LOW (ref 0.7–4.0)
MCH: 31.7 pg (ref 26.0–34.0)
MCHC: 34 g/dL (ref 30.0–36.0)
MCV: 93 fL (ref 80.0–100.0)
Monocytes Absolute: 1 10*3/uL (ref 0.1–1.0)
Monocytes Relative: 9 %
Neutro Abs: 8.9 10*3/uL — ABNORMAL HIGH (ref 1.7–7.7)
Neutrophils Relative %: 81 %
Platelets: 240 10*3/uL (ref 150–400)
RBC: 3.57 MIL/uL — ABNORMAL LOW (ref 4.22–5.81)
RDW: 13.6 % (ref 11.5–15.5)
WBC: 11.1 10*3/uL — ABNORMAL HIGH (ref 4.0–10.5)
nRBC: 0 % (ref 0.0–0.2)

## 2021-01-06 SURGERY — LAPAROSCOPIC CHOLECYSTECTOMY
Anesthesia: General | Site: Abdomen

## 2021-01-06 MED ORDER — ONDANSETRON HCL 4 MG/2ML IJ SOLN
INTRAMUSCULAR | Status: AC
  Administered 2021-01-06: 4 mg via INTRAVENOUS
  Filled 2021-01-06: qty 2

## 2021-01-06 MED ORDER — LIDOCAINE HCL (CARDIAC) PF 100 MG/5ML IV SOSY
PREFILLED_SYRINGE | INTRAVENOUS | Status: DC | PRN
  Administered 2021-01-06: 60 mg via INTRATRACHEAL

## 2021-01-06 MED ORDER — BUPIVACAINE LIPOSOME 1.3 % IJ SUSP
INTRAMUSCULAR | Status: AC
  Filled 2021-01-06: qty 20

## 2021-01-06 MED ORDER — CHLORHEXIDINE GLUCONATE 0.12 % MT SOLN
15.0000 mL | Freq: Once | OROMUCOSAL | Status: AC
  Administered 2021-01-06: 15 mL via OROMUCOSAL

## 2021-01-06 MED ORDER — METRONIDAZOLE 500 MG/100ML IV SOLN
INTRAVENOUS | Status: AC
  Administered 2021-01-06: 500 mg via INTRAVENOUS
  Filled 2021-01-06: qty 100

## 2021-01-06 MED ORDER — HYDROMORPHONE HCL 1 MG/ML IJ SOLN
0.2500 mg | INTRAMUSCULAR | Status: DC | PRN
  Administered 2021-01-06 (×2): 0.5 mg via INTRAVENOUS
  Filled 2021-01-06 (×2): qty 0.5

## 2021-01-06 MED ORDER — DEXAMETHASONE SODIUM PHOSPHATE 10 MG/ML IJ SOLN
INTRAMUSCULAR | Status: DC | PRN
  Administered 2021-01-06: 5 mg via INTRAVENOUS

## 2021-01-06 MED ORDER — SODIUM CHLORIDE 0.9 % IR SOLN
Status: DC | PRN
  Administered 2021-01-06: 1000 mL
  Administered 2021-01-06: 3000 mL

## 2021-01-06 MED ORDER — PROPOFOL 10 MG/ML IV BOLUS
INTRAVENOUS | Status: DC | PRN
  Administered 2021-01-06: 130 mg via INTRAVENOUS

## 2021-01-06 MED ORDER — ONDANSETRON HCL 4 MG/2ML IJ SOLN: 4.0000 mg | Freq: Once | INTRAMUSCULAR | Status: AC

## 2021-01-06 MED ORDER — PHENYLEPHRINE 40 MCG/ML (10ML) SYRINGE FOR IV PUSH (FOR BLOOD PRESSURE SUPPORT)
PREFILLED_SYRINGE | INTRAVENOUS | Status: AC
  Filled 2021-01-06: qty 10

## 2021-01-06 MED ORDER — LACTATED RINGERS IV SOLN: INTRAVENOUS | Status: DC

## 2021-01-06 MED ORDER — FENTANYL CITRATE (PF) 250 MCG/5ML IJ SOLN
INTRAMUSCULAR | Status: AC
  Filled 2021-01-06: qty 5

## 2021-01-06 MED ORDER — ROCURONIUM BROMIDE 10 MG/ML (PF) SYRINGE
PREFILLED_SYRINGE | INTRAVENOUS | Status: DC | PRN
  Administered 2021-01-06: 40 mg via INTRAVENOUS
  Administered 2021-01-06: 10 mg via INTRAVENOUS

## 2021-01-06 MED ORDER — SUGAMMADEX SODIUM 500 MG/5ML IV SOLN
INTRAVENOUS | Status: DC | PRN
  Administered 2021-01-06: 200 mg via INTRAVENOUS

## 2021-01-06 MED ORDER — FENTANYL CITRATE (PF) 100 MCG/2ML IJ SOLN
INTRAMUSCULAR | Status: DC | PRN
  Administered 2021-01-06 (×2): 50 ug via INTRAVENOUS
  Administered 2021-01-06: 100 ug via INTRAVENOUS
  Administered 2021-01-06: 50 ug via INTRAVENOUS

## 2021-01-06 MED ORDER — ONDANSETRON HCL 4 MG/2ML IJ SOLN
INTRAMUSCULAR | Status: AC
  Filled 2021-01-06: qty 2

## 2021-01-06 MED ORDER — ONDANSETRON HCL 4 MG/2ML IJ SOLN
INTRAMUSCULAR | Status: DC | PRN
  Administered 2021-01-06: 4 mg via INTRAVENOUS

## 2021-01-06 MED ORDER — DEXAMETHASONE SODIUM PHOSPHATE 10 MG/ML IJ SOLN
INTRAMUSCULAR | Status: AC
  Filled 2021-01-06: qty 1

## 2021-01-06 MED ORDER — ONDANSETRON HCL 4 MG/2ML IJ SOLN
4.0000 mg | Freq: Once | INTRAMUSCULAR | Status: AC | PRN
  Administered 2021-01-06: 4 mg via INTRAVENOUS
  Filled 2021-01-06: qty 2

## 2021-01-06 MED ORDER — SUCCINYLCHOLINE CHLORIDE 200 MG/10ML IV SOSY
PREFILLED_SYRINGE | INTRAVENOUS | Status: DC | PRN
  Administered 2021-01-06: 120 mg via INTRAVENOUS

## 2021-01-06 MED ORDER — MAGNESIUM SULFATE 2 GM/50ML IV SOLN
2.0000 g | Freq: Once | INTRAVENOUS | Status: AC
  Administered 2021-01-06: 2 g via INTRAVENOUS
  Filled 2021-01-06: qty 50

## 2021-01-06 MED ORDER — SIMETHICONE 80 MG PO CHEW: 40.0000 mg | CHEWABLE_TABLET | Freq: Four times a day (QID) | ORAL | Status: DC | PRN

## 2021-01-06 MED ORDER — VANCOMYCIN HCL 1000 MG/200ML IV SOLN
INTRAVENOUS | Status: AC
  Filled 2021-01-06: qty 200

## 2021-01-06 MED ORDER — PANTOPRAZOLE SODIUM 40 MG PO TBEC
40.0000 mg | DELAYED_RELEASE_TABLET | Freq: Every day | ORAL | Status: DC
  Administered 2021-01-06 – 2021-01-07 (×2): 40 mg via ORAL
  Filled 2021-01-06 (×2): qty 1

## 2021-01-06 MED ORDER — SUCCINYLCHOLINE CHLORIDE 200 MG/10ML IV SOSY
PREFILLED_SYRINGE | INTRAVENOUS | Status: AC
  Filled 2021-01-06: qty 10

## 2021-01-06 MED ORDER — ROCURONIUM BROMIDE 10 MG/ML (PF) SYRINGE
PREFILLED_SYRINGE | INTRAVENOUS | Status: AC
  Filled 2021-01-06: qty 10

## 2021-01-06 MED ORDER — PROCHLORPERAZINE EDISYLATE 10 MG/2ML IJ SOLN
5.0000 mg | Freq: Four times a day (QID) | INTRAMUSCULAR | Status: DC | PRN
  Administered 2021-01-06: 5 mg via INTRAVENOUS
  Filled 2021-01-06: qty 2

## 2021-01-06 MED ORDER — ORAL CARE MOUTH RINSE: 15.0000 mL | Freq: Once | OROMUCOSAL | Status: AC

## 2021-01-06 MED ORDER — LIDOCAINE HCL (PF) 2 % IJ SOLN
INTRAMUSCULAR | Status: AC
  Filled 2021-01-06: qty 5

## 2021-01-06 MED ORDER — PHENYLEPHRINE HCL (PRESSORS) 10 MG/ML IV SOLN
INTRAVENOUS | Status: DC | PRN
  Administered 2021-01-06 (×3): 80 ug via INTRAVENOUS

## 2021-01-06 MED ORDER — HEMOSTATIC AGENTS (NO CHARGE) OPTIME
TOPICAL | Status: DC | PRN
  Administered 2021-01-06 (×2): 1 via TOPICAL

## 2021-01-06 MED ORDER — BUPIVACAINE LIPOSOME 1.3 % IJ SUSP
INTRAMUSCULAR | Status: DC | PRN
  Administered 2021-01-06: 20 mL

## 2021-01-06 SURGICAL SUPPLY — 57 items
APL PRP STRL LF DISP 70% ISPRP (MISCELLANEOUS) ×1
APL SRG 38 LTWT LNG FL B (MISCELLANEOUS) ×1
APPLICATOR ARISTA FLEXITIP XL (MISCELLANEOUS) ×1 IMPLANT
APPLIER CLIP ROT 10 11.4 M/L (STAPLE) ×2
APR CLP MED LRG 11.4X10 (STAPLE) ×1
BAG RETRIEVAL 10 (BASKET) ×1
CHLORAPREP W/TINT 26 (MISCELLANEOUS) ×2 IMPLANT
CLIP APPLIE ROT 10 11.4 M/L (STAPLE) ×1 IMPLANT
CLOTH BEACON ORANGE TIMEOUT ST (SAFETY) ×2 IMPLANT
COVER LIGHT HANDLE STERIS (MISCELLANEOUS) ×4 IMPLANT
CUTTER FLEX LINEAR 45M (STAPLE) ×1 IMPLANT
DRSG TEGADERM 2-3/8X2-3/4 SM (GAUZE/BANDAGES/DRESSINGS) ×5 IMPLANT
DRSG TEGADERM 8X12 (GAUZE/BANDAGES/DRESSINGS) ×3 IMPLANT
ELECT REM PT RETURN 9FT ADLT (ELECTROSURGICAL) ×2
ELECTRODE REM PT RTRN 9FT ADLT (ELECTROSURGICAL) ×1 IMPLANT
EVACUATOR DRAINAGE 10X20 100CC (DRAIN) IMPLANT
EVACUATOR SILICONE 100CC (DRAIN) ×2
GAUZE 4X4 16PLY ~~LOC~~+RFID DBL (SPONGE) ×2 IMPLANT
GAUZE SPONGE 4X4 12PLY STRL (GAUZE/BANDAGES/DRESSINGS) ×1 IMPLANT
GAUZE SPONGE 4X4 12PLY STRL LF (GAUZE/BANDAGES/DRESSINGS) ×1 IMPLANT
GLOVE SURG POLYISO LF SZ7.5 (GLOVE) ×2 IMPLANT
GLOVE SURG UNDER POLY LF SZ7 (GLOVE) ×4 IMPLANT
GOWN STRL REUS W/TWL LRG LVL3 (GOWN DISPOSABLE) ×6 IMPLANT
HEMOSTAT ARISTA ABSORB 3G PWDR (HEMOSTASIS) ×1 IMPLANT
HEMOSTAT SNOW SURGICEL 2X4 (HEMOSTASIS) ×2 IMPLANT
INST SET LAPROSCOPIC AP (KITS) ×2 IMPLANT
IV NS IRRIG 3000ML ARTHROMATIC (IV SOLUTION) ×1 IMPLANT
KIT TURNOVER KIT A (KITS) ×2 IMPLANT
MANIFOLD NEPTUNE II (INSTRUMENTS) ×2 IMPLANT
NDL HYPO 18GX1.5 BLUNT FILL (NEEDLE) ×1 IMPLANT
NDL HYPO 21X1.5 SAFETY (NEEDLE) ×1 IMPLANT
NDL INSUFFLATION 14GA 120MM (NEEDLE) ×1 IMPLANT
NEEDLE HYPO 18GX1.5 BLUNT FILL (NEEDLE) ×2 IMPLANT
NEEDLE HYPO 21X1.5 SAFETY (NEEDLE) ×2 IMPLANT
NEEDLE INSUFFLATION 14GA 120MM (NEEDLE) ×2 IMPLANT
NS IRRIG 1000ML POUR BTL (IV SOLUTION) ×2 IMPLANT
PACK LAP CHOLE LZT030E (CUSTOM PROCEDURE TRAY) ×2 IMPLANT
PAD ARMBOARD 7.5X6 YLW CONV (MISCELLANEOUS) ×2 IMPLANT
RELOAD 45 VASCULAR/THIN (ENDOMECHANICALS) ×2 IMPLANT
RELOAD STAPLE 45 2.5 WHT GRN (ENDOMECHANICALS) IMPLANT
SET BASIN LINEN APH (SET/KITS/TRAYS/PACK) ×2 IMPLANT
SET TUBE IRRIG SUCTION NO TIP (IRRIGATION / IRRIGATOR) ×1 IMPLANT
SET TUBE SMOKE EVAC HIGH FLOW (TUBING) ×2 IMPLANT
SLEEVE ENDOPATH XCEL 5M (ENDOMECHANICALS) ×2 IMPLANT
SPONGE DRAIN TRACH 4X4 STRL 2S (GAUZE/BANDAGES/DRESSINGS) ×1 IMPLANT
STAPLER VISISTAT (STAPLE) ×1 IMPLANT
SUT ETHILON 3 0 FSL (SUTURE) ×1 IMPLANT
SUT MNCRL AB 4-0 PS2 18 (SUTURE) ×2 IMPLANT
SUT VICRYL 0 UR6 27IN ABS (SUTURE) ×2 IMPLANT
SYR 20ML LL LF (SYRINGE) ×4 IMPLANT
SYS BAG RETRIEVAL 10MM (BASKET) ×1
SYSTEM BAG RETRIEVAL 10MM (BASKET) ×1 IMPLANT
TROCAR ENDO BLADELESS 11MM (ENDOMECHANICALS) ×2 IMPLANT
TROCAR XCEL NON-BLD 5MMX100MML (ENDOMECHANICALS) ×2 IMPLANT
TROCAR XCEL UNIV SLVE 11M 100M (ENDOMECHANICALS) ×2 IMPLANT
TUBE CONNECTING 12X1/4 (SUCTIONS) ×2 IMPLANT
WARMER LAPAROSCOPE (MISCELLANEOUS) ×2 IMPLANT

## 2021-01-06 NOTE — Interval H&P Note (Signed)
History and Physical Interval Note:  01/06/2021 8:04 AM  Benjamin Yang  has presented today for surgery, with the diagnosis of cholecystitis.  The various methods of treatment have been discussed with the patient and family. After consideration of risks, benefits and other options for treatment, the patient has consented to  Procedure(s): LAPAROSCOPIC CHOLECYSTECTOMY (N/A) as a surgical intervention.  The patient's history has been reviewed, patient examined, no change in status, stable for surgery.  I have reviewed the patient's chart and labs.  Questions were answered to the patient's satisfaction.     Aviva Signs

## 2021-01-06 NOTE — Progress Notes (Signed)
PROGRESS NOTE  Benjamin Yang EHO:[id number] DOB: February 22, 1936 DOA: 01/02/2021 PCP: Monico Blitz, MD  Brief History:  85 year old male with a history of hypertension, bladder cancer status post cystoprostatectomy with ileal conduit, anxiety presenting with at least 3 days of abdominal pain isolated to the right upper quadrant and epigastric area.  Patient initially presented to the emergency department on 12/30/2020.  CT of the abdomen and pelvis was performed at that time and showed a distended gallbladder with a gallbladder neck stone.  There was mild diffuse gallbladder wall thickening or pericholecystic soft tissue stranding.  The patient was treated symptomatically and discharged home with follow-up with general surgery (Dr. Arnoldo Morale).  This follow-up was supposed to be on 01/06/2021.  However, the patient continued to have intermittent right upper quadrant pain since his ED visit.   Prior to coming to the hospital he had a meal of fried fish, cake, and a shot of whiskey which induced intense right upper quadrant pain that would bring him to his knees and radiated down his right side.   In the ED, the patient had a fever up to 103.9 F with tachycardia 100-110.  He was hemodynamically stable with oxygen saturation 94% room air.  BMP showed sodium 139, potassium 3.7, creatinine 1.42.  LFTs were unremarkable.  Lipase was 18.  WBC 12.6, hemoglobin 12.3, platelets 246,000.  Lactic acid was 1.0.  Chest x-ray showed low volumes with bibasilar atelectasis.  CT of the brain was negative.  General surgery was consulted to assist with management.   Assessment/Plan: Sepsis -Present on admission -due to cholecystitis -Presented with fever, tachycardia, and leukocytosis -Urine cultures of uncertain significance given they were collected from the patient's ileal conduit --initially on cefepime and metronidazole -Patient continued to be febrile up to 103.2 on 11/5 -Personally reviewed chest x-ray--no  infiltrates or consolidations -11/4 and 11/6 blood cultures remain neg -11/6 spike another fever with elevated lactate>>2.5 L fluid and started vanc -11/6--personally reviewed CXR--bibasilar atelectasis -sepsis physiology now resolved   Acute Gangrenous cholecystitis -Appreciate general Surgery consult -Continue cefepime and metronidazole -Continue IV fluids -01/02/2021 RUQ US--borderline dilated GB, prominent sludge, areas of wall thickening, no CBD dilatation. -11/7 HIDA--Failure of the gallbladder to visualize at 4 hours consistent with cystic duct obstruction and acute cholecystitis -Lap cholecystectomy 11/8 -d/c abx in 24 hours   Bacteruria -dubious clinical significance as urine is collected from ileal conduit   Essential hypertension -Holding ARB for now as the patient is normotensive   Anxiety -Continue home dose lorazepam    Hyponatremia -due to volume depletion -continue IV NS   Hypokalemia -replete -check mag--1.7  Deconditioning -PT eval>>HHPT       Status is: Inpatient   Remains inpatient appropriate because: Severity of illness requiring IV antibiotics and possible surgical intervention               Family Communication:   spouse updated at bedside 11/8   Consultants: General surgery   Code Status:  FULL   DVT Prophylaxis:  [place] Heparin      Procedures: As Listed in Progress Note Above   Antibiotics: Cefepime 11/5>> Flagyl 11/5>> -vanc 11/6>>       Subjective: Complains of some abd pain post op and nausea.  Denies f/c, cp, sob, cough, vomiting, diarrhea, headache  Objective: Vitals:   01/06/21 1115 01/06/21 1130 01/06/21 1145 01/06/21 1222  BP: (!) 141/69 134/72 136/68 (!) 145/76  Pulse: 86 81 79 77  Resp: 16 18 (!) 28 (!) 22  Temp:    98.4 F (36.9 C)  TempSrc:    Oral  SpO2: 93% 93% 99% 95%  Weight:      Height:        Intake/Output Summary (Last 24 hours) at 01/06/2021 1426 Last data filed at 01/06/2021 1042 Gross  per 24 hour  Intake 2875.13 ml  Output 2700 ml  Net 175.13 ml   Weight change:  Exam:  General:  Pt is alert, follows commands appropriately, not in acute distress HEENT: No icterus, No thrush, No neck mass, [place]/AT Cardiovascular: RRR, S1/S2, no rubs, no gallops Respiratory: bibasilar crackles. No wheeze Abdomen: Soft/+BS, mild diffuse tender, non distended, no guarding Extremities: No edema, No lymphangitis, No petechiae, No rashes, no synovitis   Data Reviewed: I have personally reviewed following labs and imaging studies Basic Metabolic Panel: Recent Labs  Lab 01/02/21 1454 01/03/21 0457 01/04/21 0433 01/05/21 0421 01/06/21 0525  NA 129* 132* 133* 134* 135  K 3.7 3.9 3.5 3.4* 3.6  CL 98 102 103 104 107  CO2 21* 21* 22 22 19*  GLUCOSE 160* 176* 196* 130* 133*  BUN 33* 27* 23 22 18   CREATININE 1.42* 1.17 1.03 1.01 0.84  CALCIUM 8.3* 8.1* 8.1* 7.9* 7.8*  MG  --   --   --  1.9 1.7   Liver Function Tests: Recent Labs  Lab 01/02/21 1454 01/03/21 0457 01/04/21 0433 01/05/21 0421 01/06/21 0525  AST 15 15 23 19 17   ALT 18 17 22 21 19   ALKPHOS 77 69 66 67 76  BILITOT 1.5* 1.3* 1.1 0.9 1.2  PROT 6.6 5.8* 5.6* 5.3* 5.4*  ALBUMIN 2.9* 2.6* 2.3* 2.1* 2.1*   Recent Labs  Lab 01/02/21 1454  LIPASE 18   No results for input(s): AMMONIA in the last 168 hours. Coagulation Profile: Recent Labs  Lab 01/02/21 1550  INR 1.1   CBC: Recent Labs  Lab 01/02/21 1454 01/03/21 0457 01/04/21 0433 01/05/21 0421 01/06/21 0525  WBC 12.6* 10.2 11.1* 12.5* 11.1*  NEUTROABS 10.4*  --   --   --  8.9*  HGB 12.3* 12.4* 11.2* 10.5* 11.3*  HCT 35.6* 36.5* 32.8* 31.7* 33.2*  MCV 94.9 95.3 94.5 95.8 93.0  PLT 246 243 204 183 240   Cardiac Enzymes: No results for input(s): CKTOTAL, CKMB, CKMBINDEX, TROPONINI in the last 168 hours. BNP: Invalid input(s): POCBNP CBG: No results for input(s): GLUCAP in the last 168 hours. HbA1C: No results for input(s): HGBA1C in the last 72  hours. Urine analysis:    Component Value Date/Time   COLORURINE YELLOW 01/02/2021 1454   APPEARANCEUR HAZY (A) 01/02/2021 1454   LABSPEC 1.012 01/02/2021 1454   PHURINE 9.0 (H) 01/02/2021 1454   GLUCOSEU NEGATIVE 01/02/2021 1454   HGBUR NEGATIVE 01/02/2021 1454   BILIRUBINUR NEGATIVE 01/02/2021 1454   KETONESUR NEGATIVE 01/02/2021 1454   PROTEINUR 100 (A) 01/02/2021 1454   UROBILINOGEN 0.2 04/21/2012 0200   NITRITE POSITIVE (A) 01/02/2021 1454   LEUKOCYTESUR TRACE (A) 01/02/2021 1454   Sepsis Labs: @LABRCNTIP (procalcitonin:4,lacticidven:4) ) Recent Results (from the past 240 hour(s))  Urine Culture     Status: Abnormal   Collection Time: 12/30/20 12:03 PM   Specimen: Urine, Clean Catch  Result Value Ref Range Status   Specimen Description   Final    URINE, CLEAN CATCH Performed at [facility], [address] [place], [place] [address]    Special Requests   Final    NONE Performed  at [facility], [address], [place], [place] [address]    Culture (A)  Final    >=100,000 COLONIES/mL PROTEUS MIRABILIS >=100,000 COLONIES/mL KLEBSIELLA OXYTOCA    Report Status 01/02/2021 FINAL  Final   Organism ID, Bacteria PROTEUS MIRABILIS (A)  Final   Organism ID, Bacteria KLEBSIELLA OXYTOCA (A)  Final      Susceptibility   Klebsiella oxytoca - MIC*    AMPICILLIN RESISTANT Resistant     CEFAZOLIN 16 SENSITIVE Sensitive     CEFEPIME <=0.12 SENSITIVE Sensitive     CEFTRIAXONE <=0.25 SENSITIVE Sensitive     CIPROFLOXACIN <=0.25 SENSITIVE Sensitive     GENTAMICIN <=1 SENSITIVE Sensitive     IMIPENEM <=0.25 SENSITIVE Sensitive     NITROFURANTOIN <=16 SENSITIVE Sensitive     TRIMETH/SULFA <=20 SENSITIVE Sensitive     AMPICILLIN/SULBACTAM 4 SENSITIVE Sensitive     PIP/TAZO <=4 SENSITIVE Sensitive     * >=100,000 COLONIES/mL KLEBSIELLA OXYTOCA   Proteus mirabilis - MIC*    AMPICILLIN <=2 SENSITIVE Sensitive     CEFAZOLIN <=4 SENSITIVE Sensitive     CEFEPIME <=0.12 SENSITIVE  Sensitive     CEFTRIAXONE <=0.25 SENSITIVE Sensitive     CIPROFLOXACIN <=0.25 SENSITIVE Sensitive     GENTAMICIN <=1 SENSITIVE Sensitive     IMIPENEM <=0.25 SENSITIVE Sensitive     NITROFURANTOIN RESISTANT Resistant     TRIMETH/SULFA <=20 SENSITIVE Sensitive     AMPICILLIN/SULBACTAM <=2 SENSITIVE Sensitive     PIP/TAZO <=4 SENSITIVE Sensitive     * >=100,000 COLONIES/mL PROTEUS MIRABILIS  Resp Panel by RT-PCR (Flu A&B, Covid) Urine, Clean Catch     Status: None   Collection Time: 01/02/21  2:54 PM   Specimen: Urine, Clean Catch; Nasopharyngeal(NP) swabs in vial transport medium  Result Value Ref Range Status   SARS Coronavirus 2 by RT PCR NEGATIVE NEGATIVE Final    Comment: (NOTE) SARS-CoV-2 target nucleic acids are NOT DETECTED.  The SARS-CoV-2 RNA is generally detectable in upper respiratory specimens during the acute phase of infection. The lowest concentration of SARS-CoV-2 viral copies this assay can detect is 138 copies/mL. A negative result does not preclude SARS-Cov-2 infection and should not be used as the sole basis for treatment or other patient management decisions. A negative result may occur with  improper specimen collection/handling, submission of specimen other than nasopharyngeal swab, presence of viral mutation(s) within the areas targeted by this assay, and inadequate number of viral copies(<138 copies/mL). A negative result must be combined with clinical observations, patient history, and epidemiological information. The expected result is Negative.  Fact Sheet for Patients:  EntrepreneurPulse.com.au  Fact Sheet for Healthcare Providers:  IncredibleEmployment.be  This test is no t yet approved or cleared by the [place] FDA and  has been authorized for detection and/or diagnosis of SARS-CoV-2 by FDA under an Emergency Use Authorization (EUA). This EUA will remain  in effect (meaning this test can be used) for the  duration of the COVID-19 declaration under Section 564(b)(1) of the Act, 21 U.S.C.section 360bbb-3(b)(1), unless the authorization is terminated  or revoked sooner.       Influenza A by PCR NEGATIVE NEGATIVE Final   Influenza B by PCR NEGATIVE NEGATIVE Final    Comment: (NOTE) The Xpert Xpress SARS-CoV-2/FLU/RSV plus assay is intended as an aid in the diagnosis of influenza from Nasopharyngeal swab specimens and should not be used as a sole basis for treatment. Nasal washings and aspirates are unacceptable for Xpert Xpress SARS-CoV-2/FLU/RSV testing.  Fact  Sheet for Patients: EntrepreneurPulse.com.au  Fact Sheet for Healthcare Providers: IncredibleEmployment.be  This test is not yet approved or cleared by the [place] FDA and has been authorized for detection and/or diagnosis of SARS-CoV-2 by FDA under an Emergency Use Authorization (EUA). This EUA will remain in effect (meaning this test can be used) for the duration of the COVID-19 declaration under Section 564(b)(1) of the Act, 21 U.S.C. section 360bbb-3(b)(1), unless the authorization is terminated or revoked.  Performed at [facility], [address] [place], [place] [address]   Blood Culture (routine x 2)     Status: None (Preliminary result)   Collection Time: 01/02/21  3:45 PM   Specimen: BLOOD RIGHT ARM  Result Value Ref Range Status   Specimen Description BLOOD RIGHT ARM  Final   Special Requests   Final    Blood Culture adequate volume BOTTLES DRAWN AEROBIC AND ANAEROBIC   Culture   Final    NO GROWTH 4 DAYS Performed at [facility], [address], [place], [place] [address]    Report Status PENDING  Incomplete  Blood Culture (routine x 2)     Status: None (Preliminary result)   Collection Time: 01/02/21  3:49 PM   Specimen: Right Antecubital; Blood  Result Value Ref Range Status   Specimen Description RIGHT ANTECUBITAL  Final   Special Requests   Final    Blood  Culture results may not be optimal due to an excessive volume of blood received in culture bottles BOTTLES DRAWN AEROBIC AND ANAEROBIC   Culture   Final    NO GROWTH 4 DAYS Performed at [facility], [address] [place], [place] [address]    Report Status PENDING  Incomplete  Urine Culture     Status: Abnormal   Collection Time: 01/02/21  8:25 PM   Specimen: Urine, Clean Catch  Result Value Ref Range Status   Specimen Description   Final    URINE, CLEAN CATCH Performed at [facility], [address], [place], [place] [address]    Special Requests   Final    NONE Performed at [facility], [address] [place], [place] [address]    Culture >=100,000 COLONIES/mL PROTEUS MIRABILIS (A)  Final   Report Status 01/05/2021 FINAL  Final   Organism ID, Bacteria PROTEUS MIRABILIS (A)  Final      Susceptibility   Proteus mirabilis - MIC*    AMPICILLIN <=2 SENSITIVE Sensitive     CEFAZOLIN <=4 SENSITIVE Sensitive     CEFEPIME <=0.12 SENSITIVE Sensitive     CEFTRIAXONE <=0.25 SENSITIVE Sensitive     CIPROFLOXACIN <=0.25 SENSITIVE Sensitive     GENTAMICIN <=1 SENSITIVE Sensitive     IMIPENEM <=0.25 SENSITIVE Sensitive     NITROFURANTOIN RESISTANT Resistant     TRIMETH/SULFA <=20 SENSITIVE Sensitive     AMPICILLIN/SULBACTAM <=2 SENSITIVE Sensitive     PIP/TAZO <=4 SENSITIVE Sensitive     * >=100,000 COLONIES/mL PROTEUS MIRABILIS  Culture, blood (Routine X 2) w Reflex to ID Panel     Status: None (Preliminary result)   Collection Time: 01/04/21  1:41 PM   Specimen: Right Antecubital; Blood  Result Value Ref Range Status   Specimen Description RIGHT ANTECUBITAL  Final   Special Requests   Final    BOTTLES DRAWN AEROBIC AND ANAEROBIC Blood Culture adequate volume   Culture   Final    NO GROWTH 2 DAYS Performed at [facility], [address], [place], [place] [address]    Report  Status PENDING  Incomplete  Culture, blood (Routine X 2) w Reflex to ID Panel     Status: None (Preliminary  result)   Collection Time: 01/04/21  1:51 PM   Specimen: BLOOD RIGHT HAND  Result Value Ref Range Status   Specimen Description BLOOD RIGHT HAND  Final   Special Requests   Final    BOTTLES DRAWN AEROBIC AND ANAEROBIC Blood Culture results may not be optimal due to an excessive volume of blood received in culture bottles   Culture   Final    NO GROWTH 2 DAYS Performed at [facility], [address] [place],  [address]    Report Status PENDING  Incomplete     Scheduled Meds:  pantoprazole  40 mg Oral Q1200   sodium chloride flush  3 mL Intravenous Q12H   Continuous Infusions:  sodium chloride 75 mL/hr at 01/06/21 1322   ceFEPime (MAXIPIME) IV 2 g (01/06/21 0252)   metronidazole Stopped (01/06/21 0939)   vancomycin 1,000 mg (01/06/21 1109)   vancomycin HCl      Procedures/Studies: CT HEAD WO CONTRAST  Result Date: 01/02/2021 CLINICAL DATA:  Mental status change, unknown cause EXAM: CT HEAD WITHOUT CONTRAST TECHNIQUE: Contiguous axial images were obtained from the base of the skull through the vertex without intravenous contrast. COMPARISON:  2013 FINDINGS: Brain: There is no acute intracranial hemorrhage, mass effect, or edema. Gray-white differentiation is preserved. There is no extra-axial fluid collection. Prominence of the ventricles and sulci reflects parenchymal volume loss. Patchy hypoattenuation in the supratentorial white matter is nonspecific but probably reflects moderate chronic microvascular ischemic changes. Vascular: There is atherosclerotic calcification at the skull base. Skull: Calvarium is unremarkable. Sinuses/Orbits: No acute finding. Other: None. IMPRESSION: No acute intracranial abnormality. Chronic microvascular ischemic changes. Electronically Signed   By: Macy Mis M.D.   On: 01/02/2021 15:34   CT Abdomen Pelvis W Contrast  Result Date: 12/30/2020 CLINICAL DATA:  Right-sided abdominal pain and nausea for 3 days. Prior surgery for bladder  carcinoma. EXAM: CT ABDOMEN AND PELVIS WITH CONTRAST TECHNIQUE: Multidetector CT imaging of the abdomen and pelvis was performed using the standard protocol following bolus administration of intravenous contrast. CONTRAST:  167mL OMNIPAQUE IOHEXOL 300 MG/ML  SOLN COMPARISON:  04/12/2017 from [facility] FINDINGS: Lower Chest: No acute findings. Hepatobiliary: No hepatic masses identified. Sub-cm cyst noted in the anterior right hepatic lobe. Distended gallbladder is seen with 4 mm gallstone in the gallbladder neck. Mild diffuse gallbladder wall thickening and pericholecystic soft tissue stranding are also seen, consistent with acute cholecystitis. No evidence of biliary ductal dilatation. Pancreas:  No mass or inflammatory changes. Spleen: Within normal limits in size and appearance. Adrenals/Urinary Tract: No masses identified. Small bilateral renal cysts are noted. No evidence of ureteral calculi or hydronephrosis. Stable postop changes from cystectomy with ileal loop diversion. Stomach/Bowel: Increased size of small to moderate hiatal hernia. No evidence of obstruction, inflammatory process or abnormal fluid collections. Diverticulosis is seen mainly involving the descending and sigmoid colon, however there is no evidence of diverticulitis. 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 Vascular/Lymphatic: No pathologically enlarged lymph nodes. No acute vascular findings. Aortic atherosclerotic calcification noted. Reproductive:  Stable postop changes from cystoprostatectomy. Other: 2 small left paraumbilical ventral hernias are again seen, both containing only fat and without significant change compared to prior exam. Musculoskeletal:  No suspicious bone lesions identified. IMPRESSION: Findings consistent with acute cholecystitis. No evidence of biliary obstruction or other complication. Stable  postop changes from cystoprostatectomy with ileal loop diversion. No  evidence of recurrent or metastatic carcinoma. Increased size of small to moderate hiatal hernia. Stable small left paraumbilical ventral hernias, which contain only fat. Colonic diverticulosis, without radiographic evidence of diverticulitis. Aortic Atherosclerosis (ICD10-I70.0). Electronically Signed   By: Marlaine Hind M.D.   On: 12/30/2020 11:55   NM Hepato W/EF  Result Date: 01/05/2021 CLINICAL DATA:  RIGHT upper quadrant abdominal pain, nausea, and vomiting; thickened gallbladder by CT and ultrasound, gallstone at lower gallbladder segment by CT, question cholecystitis EXAM: NUCLEAR MEDICINE HEPATOBILIARY IMAGING TECHNIQUE: Sequential images of the abdomen were obtained out to 60 minutes following intravenous administration of radiopharmaceutical. RADIOPHARMACEUTICALS:  5 mCi Tc-3m  Choletec IV COMPARISON:  CT abdomen and pelvis 12/30/2020, ultrasound abdomen RIGHT upper quadrant 01/02/2021 FINDINGS: Normal tracer extraction from bloodstream indicating normal hepatocellular function. Excretion of tracer into biliary tree with visualization of small-bowel at 12 minutes. At 1 hour, gallbladder had not visualized, though a photopenic region is seen at the gallbladder fossa. Delayed image at 4 hours was performed, since patient has a narcotic allergy to oxycodone and morphine was not administered. Gallbladder failed to visualized on delayed imaging consistent with cystic duct obstruction and acute cholecystitis. IMPRESSION: Failure of the gallbladder to visualize at 4 hours consistent with cystic duct obstruction and acute cholecystitis. Note that patients who are prolonged NPO can have false-positive HIDA scans for cholecystitis. Electronically Signed   By: Lavonia Dana M.D.   On: 01/05/2021 13:23   DG CHEST PORT 1 VIEW  Result Date: 01/04/2021 CLINICAL DATA:  Shortness of breath.  Fever. EXAM: PORTABLE CHEST 1 VIEW COMPARISON:  January 02, 2021 FINDINGS: Cardiomediastinal silhouette is stable. No  pneumothorax. A right-sided pleural effusion with underlying opacity remains. Left basilar opacity in the retrocardiac region remains. IMPRESSION: 1. Right pleural effusion with underlying opacity, possibly atelectasis. 2. Suspected retrocardiac opacity could represent atelectasis or infiltrate. 3. No other changes. Electronically Signed   By: Dorise Bullion III M.D.   On: 01/04/2021 14:51   DG Chest Port 1 View  Result Date: 01/02/2021 CLINICAL DATA:  Altered mental status, confusion, lethargy EXAM: PORTABLE CHEST 1 VIEW COMPARISON:  04/14/2018 FINDINGS: Lower lung volumes and elevated right hemidiaphragm. Increased streaky bibasilar opacities favored to be atelectasis, less likely pneumonia. Pleural thickening on the right. No large effusion or pneumothorax. Trachea midline. Mild cardiac enlargement without CHF. Bones are osteopenic. No acute osseous finding. Aorta atherosclerotic. IMPRESSION: Lower lung volumes with elevated right hemidiaphragm. Associated increased basilar atelectasis. Electronically Signed   By: Jerilynn Mages.  Shick M.D.   On: 01/02/2021 15:02   US Abdomen Limited RUQ (LIVER/GB)  Result Date: 01/02/2021 CLINICAL DATA:  Questionable gallstones on CT EXAM: ULTRASOUND ABDOMEN LIMITED RIGHT UPPER QUADRANT COMPARISON:  Ultrasound 12/30/2020, CT 12/30/2020 FINDINGS: Gallbladder: The gallbladder is borderline dilated with prominent intraluminal sludge. There are areas of gallbladder wall thickening. The stone seen on recent CT is not visualized sonographically. Common bile duct: Diameter: 4.8 mm Liver: No focal lesion identified. Within normal limits in parenchymal echogenicity. Portal vein is patent on color Doppler imaging with normal direction of blood flow towards the liver. Other: None. IMPRESSION: Borderline dilated gallbladder with prominent intraluminal sludge and areas of gallbladder wall thickening. Gallstone seen on recent CT is not well seen sonographically. No common bile duct dilation.  Given appearance on recent CT and ultrasound 3 days ago, findings are again concerning for acute cholecystitis. Correlate with exam and laboratory findings. Electronically Signed   By: Edison Nasuti  Chancy Milroy M.D.   On: 01/02/2021 15:41   US Abdomen Limited RUQ (LIVER/GB)  Result Date: 12/30/2020 CLINICAL DATA:  Right upper quadrant abdominal pain and nausea for 3 days. Gallstone in the neck of the gallbladder on CT scan today. EXAM: ULTRASOUND ABDOMEN LIMITED RIGHT UPPER QUADRANT COMPARISON:  CT scan 12/30/2020 FINDINGS: Gallbladder: Mild gallbladder wall thickening varying between 3-5 mm. There is sludge in the gallbladder but the stone seen in the neck of the gallbladder on CT is difficult to visualize on today's ultrasound. Sonographic Percell Miller sign is documented as being present. Trace pericholecystic fluid. Common bile duct: Diameter: 0.2 cm Liver: Equivocal accentuated hepatic echogenicity. No focal hepatic lesion identified. No intrahepatic biliary dilatation. Portal vein is patent on color Doppler imaging with normal direction of blood flow towards the liver. Other: None. IMPRESSION: 1. Gallbladder wall thickening with mild pericholecystic fluid and sonographic Murphy's sign present. The small stone in the neck of the gallbladder seen on CT is not as well appreciated on today's ultrasound. There is no CBD dilatation. Correlate clinically in assessing for acute cholecystitis. 2. Nonspecific and equivocal accentuated hepatic echogenicity diffusely, without focal hepatic lesion. Electronically Signed   By: Van Clines M.D.   On: 12/30/2020 13:54    Orson Eva, DO  [facility]  If 7PM-7AM, please contact night-coverage [email or web address] Password [email or web address] 01/06/2021, 2:26 PM   LOS: 4 days

## 2021-01-06 NOTE — Progress Notes (Signed)
Pt returned to room #[id number] via bed from PACU. Awake but drowsy, oriented x4. DDI to abd x4, JP drain with sanguinous drainage, 30 ml emptied. Urostomy draining clear yellow urine. SCD's on, IVF infusing without s/s infiltration. Wife at bedside, bed alarm on for safety.

## 2021-01-06 NOTE — Plan of Care (Signed)

## 2021-01-06 NOTE — Anesthesia Procedure Notes (Signed)
Procedure Name: Intubation Date/Time: 01/06/2021 9:26 AM Performed by: Karna Dupes, CRNA Pre-anesthesia Checklist: Patient identified, Emergency Drugs available, Suction available and Patient being monitored Patient Re-evaluated:Patient Re-evaluated prior to induction Oxygen Delivery Method: Circle system utilized Preoxygenation: Pre-oxygenation with 100% oxygen Induction Type: IV induction, Rapid sequence and Cricoid Pressure applied Laryngoscope Size: Mac and 4 Grade View: Grade I Tube type: Oral Tube size: 7.5 mm Number of attempts: 1 Airway Equipment and Method: Stylet Placement Confirmation: ETT inserted through vocal cords under direct vision, positive ETCO2 and breath sounds checked- equal and bilateral Secured at: 22 cm Tube secured with: Tape Dental Injury: Teeth and Oropharynx as per pre-operative assessment

## 2021-01-06 NOTE — Transfer of Care (Signed)
Immediate Anesthesia Transfer of Care Note  Patient: Benjamin Yang  Procedure(s) Performed: LAPAROSCOPIC CHOLECYSTECTOMY (Abdomen)  Patient Location: PACU  Anesthesia Type:General  Level of Consciousness: awake and alert   Airway & Oxygen Therapy: Patient Spontanous Breathing and Patient connected to nasal cannula oxygen  Post-op Assessment: Report given to RN and Post -op Vital signs reviewed and stable  Post vital signs: Reviewed and stable  Last Vitals:  Vitals Value Taken Time  BP 148/75   Temp 97.6   Pulse 84   Resp    SpO2 95%     Last Pain:  Vitals:   01/06/21 0754  TempSrc: Oral  PainSc:       Patients Stated Pain Goal: 0 (94/83/47 5830)  Complications: No notable events documented.

## 2021-01-06 NOTE — Anesthesia Postprocedure Evaluation (Signed)
Anesthesia Post Note  Patient: Benjamin Yang  Procedure(s) Performed: LAPAROSCOPIC CHOLECYSTECTOMY (Abdomen)  Patient location during evaluation: PACU Anesthesia Type: General Level of consciousness: awake and alert and oriented Pain management: pain level controlled Vital Signs Assessment: post-procedure vital signs reviewed and stable Respiratory status: spontaneous breathing, nonlabored ventilation and respiratory function stable Cardiovascular status: blood pressure returned to baseline and stable Postop Assessment: no apparent nausea or vomiting Anesthetic complications: no   No notable events documented.   Last Vitals:  Vitals:   01/06/21 1130 01/06/21 1145  BP: 134/72 136/68  Pulse: 81 79  Resp: 18 (!) 28  Temp:    SpO2: 93% 99%    Last Pain:  Vitals:   01/06/21 1055  TempSrc:   PainSc: 8                  Lawrencia Mauney C Tafari Humiston

## 2021-01-06 NOTE — Op Note (Signed)
Patient:  Benjamin Yang  DOB:  10/13/1935  MRN:  [id number]   Preop Diagnosis: Cholecystitis, cholelithiasis  Postop Diagnosis: Empyema of the gallbladder  Procedure: Laparoscopic cholecystectomy  Surgeon: Aviva Signs, MD  Anes: General endotracheal  Indications: Patient is an 85 year old white male who presents with acute cholecystitis secondary to cholelithiasis.  The risks and benefits of the procedure including bleeding, infection, hepatobiliary injury, and the possibility of an open procedure were fully explained to the patient, who gave informed consent.  Procedure note: The patient was placed in the supine position.  After induction of general endotracheal anesthesia, the patient's ileal conduit bag was removed and the area was covered with a Tegaderm.  The abdomen was then prepped and draped using usual sterile technique with ChloraPrep.  Surgical site confirmation was performed.  A supraumbilical incision was made down to the fascia.  A Veress needle was introduced into the abdominal cavity and confirmation of placement was done using the saline drop test.  The abdomen was then insufflated to 15 mmHg pressure.  An 1 mm trocar was introduced into the abdominal cavity under direct visualization without difficulty.  The patient was placed in reverse Trendelenburg position and an additional level meter trocar was placed in the epigastric region and 5 mm trochars were placed the right upper quadrant and right flank regions.  The liver was inspected noted to be within normal limits.  The gallbladder was encased by omentum.  This was bluntly freed away.  The gallbladder wall was noted to be inflamed and somewhat gangrenous.  An incision was made into the fundus of the gallbladder and the gallbladder was decompressed.  Purulent fluid was noted within the gallbladder lumen consistent with an empyema of the gallbladder.  The dissection was begun around the infundibulum of the gallbladder.  I  also did a dome down approach in order to get the gallbladder up on the pedicle.  The thick soft tissue was freed away bluntly and with Bovie electrocautery.  I was able to identify the cystic duct.  The cystic artery was likewise identified.  A vascular Endo GIA was placed across the cystic duct and fired.  Endoclips were placed across the cystic duct and it was divided.  The remaining gallbladder was removed from the gallbladder bed without difficulty.  The posterior wall of the gallbladder was noted to be necrotic, but I was able to fully remove the wall.  The gallbladder was then delivered through the epigastric trocar site using an Endo Catch bag.  The hepatic bed and the right upper quadrant were copiously irrigated with normal saline.  No bile leak or significant bleeding was noted.  Arista and Surgicel were placed in the gallbladder fossa.  A #10 flat Jackson-Pratt drain was placed in subhepatic space and brought out through the lateral 5 mm trocar site.  It was secured at the skin level using a 3-0 nylon suture.  All fluid and air were then evacuated from the abdominal cavity prior to removal of the trochars.  All wounds were irrigated with normal saline.  All wounds were injected with Exparel.  The supraumbilical fascia as well as epigastric fascia were reapproximated using 0 Vicryl interrupted sutures.  All skin incisions were closed using staples.  Betadine ointment and dry sterile dressings were applied.  All tape and needle counts were correct at the end of the procedure.  The patient was extubated in the operating room and transferred to the PACU in stable condition.  Complications: None  EBL: 50 cc  Specimen: Gallbladder  Drains: Jackson-Pratt drain to subhepatic space

## 2021-01-06 NOTE — Progress Notes (Signed)
Pt to OR holding via bed.

## 2021-01-07 ENCOUNTER — Encounter (HOSPITAL_COMMUNITY): Payer: Self-pay | Admitting: General Surgery

## 2021-01-07 DIAGNOSIS — K810 Acute cholecystitis: Secondary | ICD-10-CM | POA: Diagnosis not present

## 2021-01-07 DIAGNOSIS — I10 Essential (primary) hypertension: Secondary | ICD-10-CM | POA: Diagnosis not present

## 2021-01-07 LAB — CBC
HCT: 32.2 % — ABNORMAL LOW (ref 39.0–52.0)
Hemoglobin: 11.2 g/dL — ABNORMAL LOW (ref 13.0–17.0)
MCH: 31.9 pg (ref 26.0–34.0)
MCHC: 34.8 g/dL (ref 30.0–36.0)
MCV: 91.7 fL (ref 80.0–100.0)
Platelets: 313 10*3/uL (ref 150–400)
RBC: 3.51 MIL/uL — ABNORMAL LOW (ref 4.22–5.81)
RDW: 14.1 % (ref 11.5–15.5)
WBC: 15.3 10*3/uL — ABNORMAL HIGH (ref 4.0–10.5)
nRBC: 0 % (ref 0.0–0.2)

## 2021-01-07 LAB — CULTURE, BLOOD (ROUTINE X 2)
Culture: NO GROWTH
Culture: NO GROWTH
Special Requests: ADEQUATE

## 2021-01-07 LAB — COMPREHENSIVE METABOLIC PANEL
ALT: 20 U/L (ref 0–44)
AST: 19 U/L (ref 15–41)
Albumin: 2.2 g/dL — ABNORMAL LOW (ref 3.5–5.0)
Alkaline Phosphatase: 75 U/L (ref 38–126)
Anion gap: 9 (ref 5–15)
BUN: 18 mg/dL (ref 8–23)
CO2: 23 mmol/L (ref 22–32)
Calcium: 7.6 mg/dL — ABNORMAL LOW (ref 8.9–10.3)
Chloride: 104 mmol/L (ref 98–111)
Creatinine, Ser: 0.93 mg/dL (ref 0.61–1.24)
GFR, Estimated: >60 mL/min (ref >60)
Glucose, Bld: 166 mg/dL — ABNORMAL HIGH (ref 70–99)
Potassium: 3.6 mmol/L (ref 3.5–5.1)
Sodium: 136 mmol/L (ref 135–145)
Total Bilirubin: 1.2 mg/dL (ref 0.3–1.2)
Total Protein: 5.6 g/dL — ABNORMAL LOW (ref 6.5–8.1)

## 2021-01-07 LAB — MAGNESIUM: Magnesium: 2 mg/dL (ref 1.7–2.4)

## 2021-01-07 LAB — PHOSPHORUS: Phosphorus: 2.6 mg/dL (ref 2.5–4.6)

## 2021-01-07 MED ORDER — IRBESARTAN 150 MG PO TABS
300.0000 mg | ORAL_TABLET | Freq: Every day | ORAL | Status: DC
  Administered 2021-01-07 – 2021-01-08 (×2): 300 mg via ORAL
  Filled 2021-01-07 (×2): qty 2

## 2021-01-07 MED ORDER — HYDROCODONE-ACETAMINOPHEN 5-325 MG PO TABS: 1.0000 | ORAL_TABLET | ORAL | Status: DC | PRN

## 2021-01-07 NOTE — Plan of Care (Signed)

## 2021-01-07 NOTE — Progress Notes (Signed)
Physical Therapy Treatment Patient Details Name: Benjamin Yang MRN: [id number] DOB: 1935-08-21 Today's Date: 01/07/2021   History of Present Illness 85 year old male with a history of hypertension, bladder cancer status post cystoprostatectomy with ileal conduit, anxiety presenting with at least 3 days of abdominal pain isolated to the right upper quadrant and epigastric area.  Patient initially presented to the emergency department on 12/30/2020.  CT of the abdomen and pelvis was performed at that time and showed a distended gallbladder with a gallbladder neck stone.  There was mild diffuse gallbladder wall thickening or pericholecystic soft tissue stranding.  The patient was treated symptomatically and discharged home with follow-up with general surgery (Dr. Arnoldo Morale).  This follow-up was supposed to be on 01/06/2021.  However, the patient continued to have intermittent right upper quadrant pain since his ED visit.   Prior to coming to the hospital he had a meal of fried fish, cake, and a shot of whiskey which induced intense right upper quadrant pain that would bring him to his knees and radiated down his right side.    PT Comments    REASSESSMENT:  s/p LAPAROSCOPIC CHOLECYSTECTOMY on 01/06/21.  Patient presents seated in chair (assisted by nursing staff), wife present in room and patient agreeable for therapy.  Patient demonstrates slow labored movement for completing sit to stands requiring verbal cues to push off armrest of chair with good carryover, patient and spouse instructed in log rolling technique for getting into/out of bed with fair/good carryover and understanding acknowledge by patient and spouse.  Patient demonstrates increased endurance/distance for ambulating in room, no loss of balance and limited mostly due to fatigue.  Patient and spouse instructed in use of gait belt and given one to take home.  Patient tolerated staying up in chair after therapy.  Patient will benefit from  continued physical therapy in hospital and recommended venue below to increase strength, balance, endurance for safe ADLs and gait.    Recommendations for follow up therapy are one component of a multi-disciplinary discharge planning process, led by the attending physician.  Recommendations may be updated based on patient status, additional functional criteria and insurance authorization.  Follow Up Recommendations  Home health PT     Assistance Recommended at Discharge Intermittent Supervision/Assistance  Equipment Recommendations  Rolling walker (2 wheels)    Recommendations for Other Services       Precautions / Restrictions Precautions Precautions: Fall Restrictions Weight Bearing Restrictions: No     Mobility  Bed Mobility Overal bed mobility: Needs Assistance Bed Mobility: Rolling;Sidelying to Sit;Sit to Sidelying Rolling: Min guard Sidelying to sit: Min guard;Min assist     Sit to sidelying: Min guard General bed mobility comments: demonstrates fair/good carryover for log rolling and sitting up/lying down from side lying position    Transfers Overall transfer level: Needs assistance Equipment used: Rolling walker (2 wheels) Transfers: Sit to/from Stand;Bed to chair/wheelchair/BSC Sit to Stand: Min guard;Min assist Stand pivot transfers: Min guard;Min assist         General transfer comment: increased time, labored movement    Ambulation/Gait Ambulation/Gait assistance: Min guard Gait Distance (Feet): 35 Feet Assistive device: Rollator (4 wheels) Gait Pattern/deviations: Decreased step length - right;Decreased step length - left;Decreased stride length Gait velocity: decreased     General Gait Details: slow labored cadence without loss of balance, limited mostly due to fatigue   Stairs             Wheelchair Mobility    Modified Rankin (Stroke Patients  Only)       Balance Overall balance assessment: Needs assistance Sitting-balance  support: Feet supported;No upper extremity supported Sitting balance-Leahy Scale: Good Sitting balance - Comments: seated at EOB   Standing balance support: Reliant on assistive device for balance;During functional activity;Bilateral upper extremity supported Standing balance-Leahy Scale: Fair Standing balance comment: fair/good using RW                            Cognition Arousal/Alertness: Awake/alert Behavior During Therapy: WFL for tasks assessed/performed Overall Cognitive Status: Within Functional Limits for tasks assessed                                          Exercises General Exercises - Lower Extremity Long Arc Quad: Seated;AROM;Strengthening;Both;10 reps Hip Flexion/Marching: Seated;AROM;Strengthening;Both;10 reps Toe Raises: Seated;AROM;Strengthening;Both;10 reps Heel Raises: Seated;AROM;Strengthening;Both;10 reps    General Comments        Pertinent Vitals/Pain Pain Assessment: Faces Faces Pain Scale: Hurts a little bit Pain Location: stomach Pain Descriptors / Indicators: Aching;Sore Pain Intervention(s): Limited activity within patient's tolerance;Monitored during session;Repositioned    Home Living                          Prior Function            PT Goals (current goals can now be found in the care plan section) Acute Rehab PT Goals Patient Stated Goal: Pt would like to work hard with therapy and go home with [facility] PT Goal Formulation: With patient/family Time For Goal Achievement: 01/10/21 Potential to Achieve Goals: Good Progress towards PT goals: Progressing toward goals    Frequency    Min 3X/week      PT Plan Current plan remains appropriate    Co-evaluation              AM-PAC PT "6 Clicks" Mobility   Outcome Measure  Help needed turning from your back to your side while in a flat bed without using bedrails?: None Help needed moving from lying on your back to sitting on the side of a  flat bed without using bedrails?: A Little Help needed moving to and from a bed to a chair (including a wheelchair)?: A Little Help needed standing up from a chair using your arms (e.g., wheelchair or bedside chair)?: A Little Help needed to walk in hospital room?: A Little Help needed climbing 3-5 steps with a railing? : A Lot 6 Click Score: 18    End of Session   Activity Tolerance: Patient tolerated treatment well;Patient limited by fatigue Patient left: in chair;with call bell/phone within reach;with family/visitor present Nurse Communication: Mobility status PT Visit Diagnosis: Unsteadiness on feet (R26.81);Muscle weakness (generalized) (M62.81);Other abnormalities of gait and mobility (R26.89)     Time: 7353-2992 PT Time Calculation (min) (ACUTE ONLY): 24 min  Charges:  $Gait Training: 8-22 mins $Therapeutic Exercise: 8-22 mins                     11:48 AM, 01/07/21 Lonell Grandchild, MPT Physical Therapist with [facility] 336 [phone] office [phone] mobile phone

## 2021-01-07 NOTE — Progress Notes (Signed)
Pt assisted OOB to recliner this am, standby assist with FWW. Tolerated well. Pt then stated needed to have BM, pt ambulated approx 5 feet from recliner to toilet with one assist. Pt had large, soft BM. Pt cleaned and then back to recliner. Pt tolerated all activity well, minimal complaint of pain, more "soreness" in abd. Also states still has low level nausea but sipping on juice and water. MD Arnoldo Morale in to evaluate pt, aware of ambulation and BM.

## 2021-01-07 NOTE — Progress Notes (Signed)
1 Day Post-Op  Subjective: Patient is feeling fatigued but better.  Has had bowel movements.  Objective: Vital signs in last 24 hours: Temp:  [98.1 F (36.7 C)-98.8 F (37.1 C)] 98.2 F (36.8 C) (11/09 0414) Pulse Rate:  [73-80] 73 (11/09 0414) Resp:  [19-28] 19 (11/09 0414) BP: (136-162)/(68-96) 158/85 (11/09 0414) SpO2:  [95 %-99 %] 96 % (11/09 0414) Last BM Date: 01/07/21  Intake/Output from previous day: 11/08 0701 - 11/09 0700 In: 3205.8 [P.O.:240; I.V.:2115.8; IV Piggyback:850] Out: 1960 [Urine:1850; Drains:60; Blood:50] Intake/Output this shift: Total I/O In: 240 [P.O.:240] Out: 200 [Urine:200]  General appearance: alert, cooperative, and no distress Resp: clear to auscultation bilaterally Cardio: regular rate and rhythm, S1, S2 normal, no murmur, click, rub or gallop GI: Soft, incisions healing well.  Bowel sounds active.  JP drainage serosanguineous in nature.  Lab Results:  Recent Labs    01/06/21 0525 01/07/21 0513  WBC 11.1* 15.3*  HGB 11.3* 11.2*  HCT 33.2* 32.2*  PLT 240 313   BMET Recent Labs    01/06/21 0525 01/07/21 0513  NA 135 136  K 3.6 3.6  CL 107 104  CO2 19* 23  GLUCOSE 133* 166*  BUN 18 18  CREATININE 0.84 0.93  CALCIUM 7.8* 7.6*   PT/INR No results for input(s): LABPROT, INR in the last 72 hours.  Studies/Results: NM Hepato W/EF  Result Date: 01/05/2021 CLINICAL DATA:  RIGHT upper quadrant abdominal pain, nausea, and vomiting; thickened gallbladder by CT and ultrasound, gallstone at lower gallbladder segment by CT, question cholecystitis EXAM: NUCLEAR MEDICINE HEPATOBILIARY IMAGING TECHNIQUE: Sequential images of the abdomen were obtained out to 60 minutes following intravenous administration of radiopharmaceutical. RADIOPHARMACEUTICALS:  5 mCi Tc-35m  Choletec IV COMPARISON:  CT abdomen and pelvis 12/30/2020, ultrasound abdomen RIGHT upper quadrant 01/02/2021 FINDINGS: Normal tracer extraction from bloodstream indicating normal  hepatocellular function. Excretion of tracer into biliary tree with visualization of small-bowel at 12 minutes. At 1 hour, gallbladder had not visualized, though a photopenic region is seen at the gallbladder fossa. Delayed image at 4 hours was performed, since patient has a narcotic allergy to oxycodone and morphine was not administered. Gallbladder failed to visualized on delayed imaging consistent with cystic duct obstruction and acute cholecystitis. IMPRESSION: Failure of the gallbladder to visualize at 4 hours consistent with cystic duct obstruction and acute cholecystitis. Note that patients who are prolonged NPO can have false-positive HIDA scans for cholecystitis. Electronically Signed   By: Lavonia Dana M.D.   On: 01/05/2021 13:23    Anti-infectives: Anti-infectives (From admission, onward)    Start     Dose/Rate Route Frequency Ordered Stop   01/06/21 1106  vancomycin HCl (VANCOREADY) 1000 MG/200ML IVPB       Note to Pharmacy: Charm Barges   : cabinet override      01/06/21 1106 01/06/21 2211   01/05/21 1000  vancomycin (VANCOREADY) IVPB 1000 mg/200 mL        1,000 mg 200 mL/hr over 60 Minutes Intravenous Every 12 hours 01/04/21 1912     01/04/21 2000  vancomycin (VANCOREADY) IVPB 1500 mg/300 mL        1,500 mg 150 mL/hr over 120 Minutes Intravenous  Once 01/04/21 1908 01/04/21 2350   01/03/21 0600  vancomycin (VANCOREADY) IVPB 1250 mg/250 mL  Status:  Discontinued        1,250 mg 166.7 mL/hr over 90 Minutes Intravenous Every 18 hours 01/02/21 1605 01/02/21 2026   01/03/21 0500  metroNIDAZOLE (FLAGYL) IVPB 500 mg  500 mg 100 mL/hr over 60 Minutes Intravenous Every 12 hours 01/02/21 2016     01/03/21 0100  ceFEPIme (MAXIPIME) 2 g in sodium chloride 0.9 % 100 mL IVPB        2 g 200 mL/hr over 30 Minutes Intravenous Every 8 hours 01/02/21 1605     01/02/21 1530  ceFEPIme (MAXIPIME) 2 g in sodium chloride 0.9 % 100 mL IVPB        2 g 200 mL/hr over 30 Minutes Intravenous   Once 01/02/21 1527 01/02/21 1638   01/02/21 1530  metroNIDAZOLE (FLAGYL) IVPB 500 mg        500 mg 100 mL/hr over 60 Minutes Intravenous  Once 01/02/21 1527 01/02/21 1718   01/02/21 1530  vancomycin (VANCOREADY) IVPB 1000 mg/200 mL        1,000 mg 200 mL/hr over 60 Minutes Intravenous  Once 01/02/21 1527 01/02/21 1744       Assessment/Plan: s/p Procedure(s): LAPAROSCOPIC CHOLECYSTECTOMY Impression: Stable on postoperative day 1.  Patient's bowel function has returned.  Will advance diet.  We will get PT evaluation for discharge planning.  Continue IV antibiotics.  LOS: 5 days    Aviva Signs 01/07/2021

## 2021-01-07 NOTE — Progress Notes (Signed)
PROGRESS NOTE  Benjamin Yang FAO:[id number] DOB: Apr 13, 1935 DOA: 01/02/2021 PCP: Monico Blitz, MD  Brief History:  85 year old male with a history of hypertension, bladder cancer status post cystoprostatectomy with ileal conduit, anxiety presenting with at least 3 days of abdominal pain isolated to the right upper quadrant and epigastric area.  Patient initially presented to the emergency department on 12/30/2020.  CT of the abdomen and pelvis was performed at that time and showed a distended gallbladder with a gallbladder neck stone.  There was mild diffuse gallbladder wall thickening or pericholecystic soft tissue stranding.  The patient was treated symptomatically and discharged home with follow-up with general surgery (Dr. Arnoldo Morale).  This follow-up was supposed to be on 01/06/2021.  However, the patient continued to have intermittent right upper quadrant pain since his ED visit.   Prior to coming to the hospital he had a meal of fried fish, cake, and a shot of whiskey which induced intense right upper quadrant pain that would bring him to his knees and radiated down his right side.   In the ED, the patient had a fever up to 103.9 F with tachycardia 100-110.  He was hemodynamically stable with oxygen saturation 94% room air.  BMP showed sodium 139, potassium 3.7, creatinine 1.42.  LFTs were unremarkable.  Lipase was 18.  WBC 12.6, hemoglobin 12.3, platelets 246,000.  Lactic acid was 1.0.  Chest x-ray showed low volumes with bibasilar atelectasis.  CT of the brain was negative.  General surgery was consulted to assist with management.   Assessment/Plan: Sepsis -Present on admission -due to cholecystitis -Presented with fever, tachycardia, and leukocytosis -Urine cultures of uncertain significance given they were collected from the patient's ileal conduit --initially on cefepime and metronidazole -Patient continued to be febrile up to 103.2 on 11/5 -Personally reviewed chest x-ray--no  infiltrates or consolidations -11/4 and 11/6 blood cultures remain neg -11/6 spike another fever with elevated lactate>>2.5 L fluid and started vanc -11/6--personally reviewed CXR--bibasilar atelectasis -sepsis physiology now resolved   Acute Gangrenous cholecystitis -Appreciate general Surgery consult -Continue cefepime and metronidazole -Continue IV fluids -01/02/2021 RUQ US--borderline dilated GB, prominent sludge, areas of wall thickening, no CBD dilatation. -11/7 HIDA--Failure of the gallbladder to visualize at 4 hours consistent with cystic duct obstruction and acute cholecystitis -Post op s/p Lap cholecystectomy 11/8 -continue IV antibiotics    Bacteruria -dubious clinical significance as urine is collected from ileal conduit   Essential hypertension -resume ARB therapy    Anxiety -Continue home dose lorazepam    Hyponatremia - RESOLVED  -due to volume depletion -reduce IV NS   Hypokalemia -replete -check mag--1.7  Deconditioning -PT eval>>HHPT    Status is: Inpatient   Remains inpatient appropriate because: Severity of illness requiring IV antibiotics and possible surgical intervention      Family Communication:   spouse updated at bedside 11/9   Consultants: General surgery   Code Status:  FULL   DVT Prophylaxis:  [place] Heparin      Procedures: As Listed in Progress Note Above   Antibiotics: Cefepime 11/5>> Flagyl 11/5>> -vanc 11/6>>    Subjective: Pt sitting up in chair, eating his breakfast meal, overall feeling a lot better but not back to baseline.   Objective: Vitals:   01/06/21 1222 01/06/21 1612 01/06/21 1955 01/07/21 0414  BP: (!) 145/76 (!) 162/96 (!) 143/90 (!) 158/85  Pulse: 77 80 76 73  Resp: (!) 22 20 19 19   Temp: 98.4 F (  36.9 C) 98.1 F (36.7 C) 98.8 F (37.1 C) 98.2 F (36.8 C)  TempSrc: Oral Oral Oral   SpO2: 95% 95% 96% 96%  Weight:      Height:        Intake/Output Summary (Last 24 hours) at 01/07/2021 1334 Last  data filed at 01/07/2021 1155 Gross per 24 hour  Intake 2345.78 ml  Output 1255 ml  Net 1090.78 ml   Weight change:  Exam:  General:  Pt is alert, follows commands appropriately, not in acute distress HEENT: No icterus, No thrush, No neck mass, [place]/AT Cardiovascular: normal S1/S2, no rubs, no gallops Respiratory: bibasilar crackles. No wheeze Abdomen: Soft/+BS, mild RLQ tenderness, JP drain in place,  non distended, no guarding Extremities: No edema, No lymphangitis, No petechiae, No rashes, no synovitis  Data Reviewed: I have personally reviewed following labs and imaging studies Basic Metabolic Panel: Recent Labs  Lab 01/03/21 0457 01/04/21 0433 01/05/21 0421 01/06/21 0525 01/07/21 0513  NA 132* 133* 134* 135 136  K 3.9 3.5 3.4* 3.6 3.6  CL 102 103 104 107 104  CO2 21* 22 22 19* 23  GLUCOSE 176* 196* 130* 133* 166*  BUN 27* 23 22 18 18   CREATININE 1.17 1.03 1.01 0.84 0.93  CALCIUM 8.1* 8.1* 7.9* 7.8* 7.6*  MG  --   --  1.9 1.7 2.0  PHOS  --   --   --   --  2.6   Liver Function Tests: Recent Labs  Lab 01/03/21 0457 01/04/21 0433 01/05/21 0421 01/06/21 0525 01/07/21 0513  AST 15 23 19 17 19   ALT 17 22 21 19 20   ALKPHOS 69 66 67 76 75  BILITOT 1.3* 1.1 0.9 1.2 1.2  PROT 5.8* 5.6* 5.3* 5.4* 5.6*  ALBUMIN 2.6* 2.3* 2.1* 2.1* 2.2*   Recent Labs  Lab 01/02/21 1454  LIPASE 18   No results for input(s): AMMONIA in the last 168 hours. Coagulation Profile: Recent Labs  Lab 01/02/21 1550  INR 1.1   CBC: Recent Labs  Lab 01/02/21 1454 01/03/21 0457 01/04/21 0433 01/05/21 0421 01/06/21 0525 01/07/21 0513  WBC 12.6* 10.2 11.1* 12.5* 11.1* 15.3*  NEUTROABS 10.4*  --   --   --  8.9*  --   HGB 12.3* 12.4* 11.2* 10.5* 11.3* 11.2*  HCT 35.6* 36.5* 32.8* 31.7* 33.2* 32.2*  MCV 94.9 95.3 94.5 95.8 93.0 91.7  PLT 246 243 204 183 240 313   Cardiac Enzymes: No results for input(s): CKTOTAL, CKMB, CKMBINDEX, TROPONINI in the last 168 hours. BNP: Invalid  input(s): POCBNP CBG: No results for input(s): GLUCAP in the last 168 hours. HbA1C: No results for input(s): HGBA1C in the last 72 hours. Urine analysis:    Component Value Date/Time   COLORURINE YELLOW 01/02/2021 1454   APPEARANCEUR HAZY (A) 01/02/2021 1454   LABSPEC 1.012 01/02/2021 1454   PHURINE 9.0 (H) 01/02/2021 1454   GLUCOSEU NEGATIVE 01/02/2021 1454   HGBUR NEGATIVE 01/02/2021 1454   BILIRUBINUR NEGATIVE 01/02/2021 1454   KETONESUR NEGATIVE 01/02/2021 1454   PROTEINUR 100 (A) 01/02/2021 1454   UROBILINOGEN 0.2 04/21/2012 0200   NITRITE POSITIVE (A) 01/02/2021 1454   LEUKOCYTESUR TRACE (A) 01/02/2021 1454   Sepsis Labs:  Recent Results (from the past 240 hour(s))  Urine Culture     Status: Abnormal   Collection Time: 12/30/20 12:03 PM   Specimen: Urine, Clean Catch  Result Value Ref Range Status   Specimen Description   Final    URINE, CLEAN CATCH  Performed at [facility], [address] [place], [place] [address]    Special Requests   Final    NONE Performed at [facility], [address] [place], [place] [address]    Culture (A)  Final    >=100,000 COLONIES/mL PROTEUS MIRABILIS >=100,000 COLONIES/mL KLEBSIELLA OXYTOCA    Report Status 01/02/2021 FINAL  Final   Organism ID, Bacteria PROTEUS MIRABILIS (A)  Final   Organism ID, Bacteria KLEBSIELLA OXYTOCA (A)  Final      Susceptibility   Klebsiella oxytoca - MIC*    AMPICILLIN RESISTANT Resistant     CEFAZOLIN 16 SENSITIVE Sensitive     CEFEPIME <=0.12 SENSITIVE Sensitive     CEFTRIAXONE <=0.25 SENSITIVE Sensitive     CIPROFLOXACIN <=0.25 SENSITIVE Sensitive     GENTAMICIN <=1 SENSITIVE Sensitive     IMIPENEM <=0.25 SENSITIVE Sensitive     NITROFURANTOIN <=16 SENSITIVE Sensitive     TRIMETH/SULFA <=20 SENSITIVE Sensitive     AMPICILLIN/SULBACTAM 4 SENSITIVE Sensitive     PIP/TAZO <=4 SENSITIVE Sensitive     * >=100,000 COLONIES/mL KLEBSIELLA OXYTOCA   Proteus mirabilis - MIC*    AMPICILLIN <=2  SENSITIVE Sensitive     CEFAZOLIN <=4 SENSITIVE Sensitive     CEFEPIME <=0.12 SENSITIVE Sensitive     CEFTRIAXONE <=0.25 SENSITIVE Sensitive     CIPROFLOXACIN <=0.25 SENSITIVE Sensitive     GENTAMICIN <=1 SENSITIVE Sensitive     IMIPENEM <=0.25 SENSITIVE Sensitive     NITROFURANTOIN RESISTANT Resistant     TRIMETH/SULFA <=20 SENSITIVE Sensitive     AMPICILLIN/SULBACTAM <=2 SENSITIVE Sensitive     PIP/TAZO <=4 SENSITIVE Sensitive     * >=100,000 COLONIES/mL PROTEUS MIRABILIS  Resp Panel by RT-PCR (Flu A&B, Covid) Urine, Clean Catch     Status: None   Collection Time: 01/02/21  2:54 PM   Specimen: Urine, Clean Catch; Nasopharyngeal(NP) swabs in vial transport medium  Result Value Ref Range Status   SARS Coronavirus 2 by RT PCR NEGATIVE NEGATIVE Final    Comment: (NOTE) SARS-CoV-2 target nucleic acids are NOT DETECTED.  The SARS-CoV-2 RNA is generally detectable in upper respiratory specimens during the acute phase of infection. The lowest concentration of SARS-CoV-2 viral copies this assay can detect is 138 copies/mL. A negative result does not preclude SARS-Cov-2 infection and should not be used as the sole basis for treatment or other patient management decisions. A negative result may occur with  improper specimen collection/handling, submission of specimen other than nasopharyngeal swab, presence of viral mutation(s) within the areas targeted by this assay, and inadequate number of viral copies(<138 copies/mL). A negative result must be combined with clinical observations, patient history, and epidemiological information. The expected result is Negative.  Fact Sheet for Patients:  EntrepreneurPulse.com.au  Fact Sheet for Healthcare Providers:  IncredibleEmployment.be  This test is no t yet approved or cleared by the [place] FDA and  has been authorized for detection and/or diagnosis of SARS-CoV-2 by FDA under an Emergency Use  Authorization (EUA). This EUA will remain  in effect (meaning this test can be used) for the duration of the COVID-19 declaration under Section 564(b)(1) of the Act, 21 U.S.C.section 360bbb-3(b)(1), unless the authorization is terminated  or revoked sooner.       Influenza A by PCR NEGATIVE NEGATIVE Final   Influenza B by PCR NEGATIVE NEGATIVE Final    Comment: (NOTE) The Xpert Xpress SARS-CoV-2/FLU/RSV plus assay is intended as an aid in the diagnosis of influenza from Nasopharyngeal swab specimens  and should not be used as a sole basis for treatment. Nasal washings and aspirates are unacceptable for Xpert Xpress SARS-CoV-2/FLU/RSV testing.  Fact Sheet for Patients: EntrepreneurPulse.com.au  Fact Sheet for Healthcare Providers: IncredibleEmployment.be  This test is not yet approved or cleared by the [place] FDA and has been authorized for detection and/or diagnosis of SARS-CoV-2 by FDA under an Emergency Use Authorization (EUA). This EUA will remain in effect (meaning this test can be used) for the duration of the COVID-19 declaration under Section 564(b)(1) of the Act, 21 U.S.C. section 360bbb-3(b)(1), unless the authorization is terminated or revoked.  Performed at [facility], [address], [place], [place] [address]   Blood Culture (routine x 2)     Status: None   Collection Time: 01/02/21  3:45 PM   Specimen: BLOOD RIGHT ARM  Result Value Ref Range Status   Specimen Description BLOOD RIGHT ARM  Final   Special Requests   Final    Blood Culture adequate volume BOTTLES DRAWN AEROBIC AND ANAEROBIC   Culture   Final    NO GROWTH 5 DAYS Performed at [facility], [address], [place], [place] [address]    Report Status 01/07/2021 FINAL  Final  Blood Culture (routine x 2)     Status: None   Collection Time: 01/02/21  3:49 PM   Specimen: Right Antecubital; Blood  Result Value Ref Range Status   Specimen Description RIGHT  ANTECUBITAL  Final   Special Requests   Final    Blood Culture results may not be optimal due to an excessive volume of blood received in culture bottles BOTTLES DRAWN AEROBIC AND ANAEROBIC   Culture   Final    NO GROWTH 5 DAYS Performed at [facility], [address] [place], [place] [address]    Report Status 01/07/2021 FINAL  Final  Urine Culture     Status: Abnormal   Collection Time: 01/02/21  8:25 PM   Specimen: Urine, Clean Catch  Result Value Ref Range Status   Specimen Description   Final    URINE, CLEAN CATCH Performed at [facility], [address] [place], [place] [address]    Special Requests   Final    NONE Performed at [facility], [address] [place], [place] [address]    Culture >=100,000 COLONIES/mL PROTEUS MIRABILIS (A)  Final   Report Status 01/05/2021 FINAL  Final   Organism ID, Bacteria PROTEUS MIRABILIS (A)  Final      Susceptibility   Proteus mirabilis - MIC*    AMPICILLIN <=2 SENSITIVE Sensitive     CEFAZOLIN <=4 SENSITIVE Sensitive     CEFEPIME <=0.12 SENSITIVE Sensitive     CEFTRIAXONE <=0.25 SENSITIVE Sensitive     CIPROFLOXACIN <=0.25 SENSITIVE Sensitive     GENTAMICIN <=1 SENSITIVE Sensitive     IMIPENEM <=0.25 SENSITIVE Sensitive     NITROFURANTOIN RESISTANT Resistant     TRIMETH/SULFA <=20 SENSITIVE Sensitive     AMPICILLIN/SULBACTAM <=2 SENSITIVE Sensitive     PIP/TAZO <=4 SENSITIVE Sensitive     * >=100,000 COLONIES/mL PROTEUS MIRABILIS  Culture, blood (Routine X 2) w Reflex to ID Panel     Status: None (Preliminary result)   Collection Time: 01/04/21  1:41 PM   Specimen: Right Antecubital; Blood  Result Value Ref Range Status   Specimen Description RIGHT ANTECUBITAL  Final   Special Requests   Final    BOTTLES DRAWN AEROBIC AND ANAEROBIC Blood Culture adequate volume   Culture   Final  NO GROWTH 3 DAYS Performed at [facility], [address] [place], [place] [address]    Report Status PENDING  Incomplete  Culture, blood  (Routine X 2) w Reflex to ID Panel     Status: None (Preliminary result)   Collection Time: 01/04/21  1:51 PM   Specimen: BLOOD RIGHT HAND  Result Value Ref Range Status   Specimen Description BLOOD RIGHT HAND  Final   Special Requests   Final    BOTTLES DRAWN AEROBIC AND ANAEROBIC Blood Culture results may not be optimal due to an excessive volume of blood received in culture bottles   Culture   Final    NO GROWTH 3 DAYS Performed at [facility], [address], [place], [place] [address]    Report Status PENDING  Incomplete     Scheduled Meds:  pantoprazole  40 mg Oral Q1200   sodium chloride flush  3 mL Intravenous Q12H   Continuous Infusions:  sodium chloride 50 mL/hr at 01/07/21 1155   ceFEPime (MAXIPIME) IV 2 g (01/07/21 1042)   metronidazole 500 mg (01/07/21 0425)   vancomycin 1,000 mg (01/07/21 1154)    Procedures/Studies: CT HEAD WO CONTRAST  Result Date: 01/02/2021 CLINICAL DATA:  Mental status change, unknown cause EXAM: CT HEAD WITHOUT CONTRAST TECHNIQUE: Contiguous axial images were obtained from the base of the skull through the vertex without intravenous contrast. COMPARISON:  2013 FINDINGS: Brain: There is no acute intracranial hemorrhage, mass effect, or edema. Gray-white differentiation is preserved. There is no extra-axial fluid collection. Prominence of the ventricles and sulci reflects parenchymal volume loss. Patchy hypoattenuation in the supratentorial white matter is nonspecific but probably reflects moderate chronic microvascular ischemic changes. Vascular: There is atherosclerotic calcification at the skull base. Skull: Calvarium is unremarkable. Sinuses/Orbits: No acute finding. Other: None. IMPRESSION: No acute intracranial abnormality. Chronic microvascular ischemic changes. Electronically Signed   By: Macy Mis M.D.   On: 01/02/2021 15:34   CT Abdomen Pelvis W Contrast  Result Date: 12/30/2020 CLINICAL DATA:  Right-sided abdominal pain and nausea for  3 days. Prior surgery for bladder carcinoma. EXAM: CT ABDOMEN AND PELVIS WITH CONTRAST TECHNIQUE: Multidetector CT imaging of the abdomen and pelvis was performed using the standard protocol following bolus administration of intravenous contrast. CONTRAST:  167mL OMNIPAQUE IOHEXOL 300 MG/ML  SOLN COMPARISON:  04/12/2017 from [facility] FINDINGS: Lower Chest: No acute findings. Hepatobiliary: No hepatic masses identified. Sub-cm cyst noted in the anterior right hepatic lobe. Distended gallbladder is seen with 4 mm gallstone in the gallbladder neck. Mild diffuse gallbladder wall thickening and pericholecystic soft tissue stranding are also seen, consistent with acute cholecystitis. No evidence of biliary ductal dilatation. Pancreas:  No mass or inflammatory changes. Spleen: Within normal limits in size and appearance. Adrenals/Urinary Tract: No masses identified. Small bilateral renal cysts are noted. No evidence of ureteral calculi or hydronephrosis. Stable postop changes from cystectomy with ileal loop diversion. Stomach/Bowel: Increased size of small to moderate hiatal hernia. No evidence of obstruction, inflammatory process or abnormal fluid collections. Diverticulosis is seen mainly involving the descending and sigmoid colon, however there is no evidence of diverticulitis. 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 Vascular/Lymphatic: No pathologically enlarged lymph nodes. No acute vascular findings. Aortic atherosclerotic calcification noted. Reproductive:  Stable postop changes from cystoprostatectomy. Other: 2 small left paraumbilical ventral hernias are again seen, both containing only fat and without significant change compared to prior exam. Musculoskeletal:  No suspicious bone lesions identified. IMPRESSION:  Findings consistent with acute cholecystitis. No evidence of biliary obstruction or other complication. Stable postop changes from cystoprostatectomy  with ileal loop diversion. No evidence of recurrent or metastatic carcinoma. Increased size of small to moderate hiatal hernia. Stable small left paraumbilical ventral hernias, which contain only fat. Colonic diverticulosis, without radiographic evidence of diverticulitis. Aortic Atherosclerosis (ICD10-I70.0). Electronically Signed   By: Marlaine Hind M.D.   On: 12/30/2020 11:55   NM Hepato W/EF  Result Date: 01/05/2021 CLINICAL DATA:  RIGHT upper quadrant abdominal pain, nausea, and vomiting; thickened gallbladder by CT and ultrasound, gallstone at lower gallbladder segment by CT, question cholecystitis EXAM: NUCLEAR MEDICINE HEPATOBILIARY IMAGING TECHNIQUE: Sequential images of the abdomen were obtained out to 60 minutes following intravenous administration of radiopharmaceutical. RADIOPHARMACEUTICALS:  5 mCi Tc-36m  Choletec IV COMPARISON:  CT abdomen and pelvis 12/30/2020, ultrasound abdomen RIGHT upper quadrant 01/02/2021 FINDINGS: Normal tracer extraction from bloodstream indicating normal hepatocellular function. Excretion of tracer into biliary tree with visualization of small-bowel at 12 minutes. At 1 hour, gallbladder had not visualized, though a photopenic region is seen at the gallbladder fossa. Delayed image at 4 hours was performed, since patient has a narcotic allergy to oxycodone and morphine was not administered. Gallbladder failed to visualized on delayed imaging consistent with cystic duct obstruction and acute cholecystitis. IMPRESSION: Failure of the gallbladder to visualize at 4 hours consistent with cystic duct obstruction and acute cholecystitis. Note that patients who are prolonged NPO can have false-positive HIDA scans for cholecystitis. Electronically Signed   By: Lavonia Dana M.D.   On: 01/05/2021 13:23   DG CHEST PORT 1 VIEW  Result Date: 01/04/2021 CLINICAL DATA:  Shortness of breath.  Fever. EXAM: PORTABLE CHEST 1 VIEW COMPARISON:  January 02, 2021 FINDINGS: Cardiomediastinal  silhouette is stable. No pneumothorax. A right-sided pleural effusion with underlying opacity remains. Left basilar opacity in the retrocardiac region remains. IMPRESSION: 1. Right pleural effusion with underlying opacity, possibly atelectasis. 2. Suspected retrocardiac opacity could represent atelectasis or infiltrate. 3. No other changes. Electronically Signed   By: Dorise Bullion III M.D.   On: 01/04/2021 14:51   DG Chest Port 1 View  Result Date: 01/02/2021 CLINICAL DATA:  Altered mental status, confusion, lethargy EXAM: PORTABLE CHEST 1 VIEW COMPARISON:  04/14/2018 FINDINGS: Lower lung volumes and elevated right hemidiaphragm. Increased streaky bibasilar opacities favored to be atelectasis, less likely pneumonia. Pleural thickening on the right. No large effusion or pneumothorax. Trachea midline. Mild cardiac enlargement without CHF. Bones are osteopenic. No acute osseous finding. Aorta atherosclerotic. IMPRESSION: Lower lung volumes with elevated right hemidiaphragm. Associated increased basilar atelectasis. Electronically Signed   By: Jerilynn Mages.  Shick M.D.   On: 01/02/2021 15:02   US Abdomen Limited RUQ (LIVER/GB)  Result Date: 01/02/2021 CLINICAL DATA:  Questionable gallstones on CT EXAM: ULTRASOUND ABDOMEN LIMITED RIGHT UPPER QUADRANT COMPARISON:  Ultrasound 12/30/2020, CT 12/30/2020 FINDINGS: Gallbladder: The gallbladder is borderline dilated with prominent intraluminal sludge. There are areas of gallbladder wall thickening. The stone seen on recent CT is not visualized sonographically. Common bile duct: Diameter: 4.8 mm Liver: No focal lesion identified. Within normal limits in parenchymal echogenicity. Portal vein is patent on color Doppler imaging with normal direction of blood flow towards the liver. Other: None. IMPRESSION: Borderline dilated gallbladder with prominent intraluminal sludge and areas of gallbladder wall thickening. Gallstone seen on recent CT is not well seen sonographically. No  common bile duct dilation. Given appearance on recent CT and ultrasound 3 days ago, findings are again concerning for  acute cholecystitis. Correlate with exam and laboratory findings. Electronically Signed   By: Maurine Simmering M.D.   On: 01/02/2021 15:41   US Abdomen Limited RUQ (LIVER/GB)  Result Date: 12/30/2020 CLINICAL DATA:  Right upper quadrant abdominal pain and nausea for 3 days. Gallstone in the neck of the gallbladder on CT scan today. EXAM: ULTRASOUND ABDOMEN LIMITED RIGHT UPPER QUADRANT COMPARISON:  CT scan 12/30/2020 FINDINGS: Gallbladder: Mild gallbladder wall thickening varying between 3-5 mm. There is sludge in the gallbladder but the stone seen in the neck of the gallbladder on CT is difficult to visualize on today's ultrasound. Sonographic Percell Miller sign is documented as being present. Trace pericholecystic fluid. Common bile duct: Diameter: 0.2 cm Liver: Equivocal accentuated hepatic echogenicity. No focal hepatic lesion identified. No intrahepatic biliary dilatation. Portal vein is patent on color Doppler imaging with normal direction of blood flow towards the liver. Other: None. IMPRESSION: 1. Gallbladder wall thickening with mild pericholecystic fluid and sonographic Murphy's sign present. The small stone in the neck of the gallbladder seen on CT is not as well appreciated on today's ultrasound. There is no CBD dilatation. Correlate clinically in assessing for acute cholecystitis. 2. Nonspecific and equivocal accentuated hepatic echogenicity diffusely, without focal hepatic lesion. Electronically Signed   By: Van Clines M.D.   On: 12/30/2020 13:54    Irwin Brakeman, MD   [facility]  If 7PM-7AM, please contact night-coverage [email or web address] Password [email or web address] 01/07/2021, 1:34 PM   LOS: 5 days

## 2021-01-08 DIAGNOSIS — I10 Essential (primary) hypertension: Secondary | ICD-10-CM | POA: Diagnosis not present

## 2021-01-08 DIAGNOSIS — K810 Acute cholecystitis: Secondary | ICD-10-CM | POA: Diagnosis not present

## 2021-01-08 LAB — COMPREHENSIVE METABOLIC PANEL
ALT: 19 U/L (ref 0–44)
AST: 18 U/L (ref 15–41)
Albumin: 2.3 g/dL — ABNORMAL LOW (ref 3.5–5.0)
Alkaline Phosphatase: 66 U/L (ref 38–126)
Anion gap: 8 (ref 5–15)
BUN: 15 mg/dL (ref 8–23)
CO2: 22 mmol/L (ref 22–32)
Calcium: 7.8 mg/dL — ABNORMAL LOW (ref 8.9–10.3)
Chloride: 107 mmol/L (ref 98–111)
Creatinine, Ser: 0.98 mg/dL (ref 0.61–1.24)
GFR, Estimated: >60 mL/min (ref >60)
Glucose, Bld: 159 mg/dL — ABNORMAL HIGH (ref 70–99)
Potassium: 3.8 mmol/L (ref 3.5–5.1)
Sodium: 137 mmol/L (ref 135–145)
Total Bilirubin: 0.6 mg/dL (ref 0.3–1.2)
Total Protein: 5.4 g/dL — ABNORMAL LOW (ref 6.5–8.1)

## 2021-01-08 LAB — MAGNESIUM: Magnesium: 1.9 mg/dL (ref 1.7–2.4)

## 2021-01-08 LAB — CBC
HCT: 33 % — ABNORMAL LOW (ref 39.0–52.0)
Hemoglobin: 11 g/dL — ABNORMAL LOW (ref 13.0–17.0)
MCH: 32 pg (ref 26.0–34.0)
MCHC: 33.3 g/dL (ref 30.0–36.0)
MCV: 95.9 fL (ref 80.0–100.0)
Platelets: 383 10*3/uL (ref 150–400)
RBC: 3.44 MIL/uL — ABNORMAL LOW (ref 4.22–5.81)
RDW: 14.7 % (ref 11.5–15.5)
WBC: 14.9 10*3/uL — ABNORMAL HIGH (ref 4.0–10.5)
nRBC: 0 % (ref 0.0–0.2)

## 2021-01-08 LAB — PHOSPHORUS: Phosphorus: 2 mg/dL — ABNORMAL LOW (ref 2.5–4.6)

## 2021-01-08 LAB — VANCOMYCIN, TROUGH: Vancomycin Tr: 23 ug/mL (ref 15–20)

## 2021-01-08 LAB — SURGICAL PATHOLOGY

## 2021-01-08 MED ORDER — AMOXICILLIN-POT CLAVULANATE 875-125 MG PO TABS: 1.0000 | ORAL_TABLET | Freq: Two times a day (BID) | ORAL | 0 refills | Status: AC

## 2021-01-08 NOTE — Discharge Summary (Signed)
Physician Discharge Summary  Benjamin Yang DQQ:[id number] DOB: [birth year] DOA: 01/02/2021  PCP: Monico Blitz, MD Surgery: Dr. Arnoldo Morale   Admit date: 01/02/2021 Discharge date: 01/08/2021  Admitted From:  HOME  Disposition: HOME with [facility]   Recommendations for Outpatient Follow-up:  Follow up with Dr. Arnoldo Morale in 1 weeks Follow up with PCP in 2 weeks    Home Health:  PT   Discharge Condition: STABLE   CODE STATUS: FULL  DIET: soft and heart healthy recommended    Brief Hospitalization Summary: Please see all hospital notes, images, labs for full details of the hospitalization. ADMISSION HPI:    Benjamin Yang is a 85 y.o. male with hx of hypertension, bladder cancer status post robotic cystoprostatectomy with node dissection and ileal conduit urinary diversion, who presents with abdominal pain.   Patient was seen in the [facility], ED 3 days prior, at that time he noted right-sided abdominal pain for several days.  He underwent a CT scan that showed findings concerning for acute cholecystitis.  He improved while in the ED, and plan was made for him to follow-up outpatient with general surgery (Dr. Arnoldo Morale) and he was discharged home.   Today patient reports he has been having intermittent right upper quadrant pain since he left.  Prior to coming to the hospital he had a meal of fried fish, cake, and a shot of whiskey which induced intense right upper quadrant pain that would bring him to his knees and radiated down his right side.  Presently he feels much better and his pain is mostly gone.  He is wondering if he may have peritonitis related to his urostomy.  He endorses being feverish earlier but denies feeling feverish at present, no nausea or vomiting presently.   In the ED initial vital signs notable for fever to 103.9 Fahrenheit, and increased respirations in the mid 20s.  Lab work showed white count of 12.6 which was mildly improved from 15.2 three days prior, remainder of CBC  unremarkable.  CMP showed mild hyponatremia to 129, creatinine of 1.4 up from 1.0 three days prior, AST/ALT were normal, T bili was elevated at 1.5 and had been 1.1 at prior ED visit.  UA showed positive nitrite and leuks, however this has consistently been the case reviewing prior results.  Lactic acid was normal.  COVID and influenza respiratory panel was negative.  Chest x-ray with no obvious consolidations, CT head without acute findings, and right upper quadrant ultrasound showed ongoing findings concerning for acute cholecystitis.  Patient was started on cefepime, Flagyl, vancomycin, given IV fluid resuscitation, and admitted for further management.  In addition Dr. Arnoldo Morale from general surgery evaluated the patient, not totally convinced of diagnosis of cholecystitis and recommended ongoing work-up for etiology of fever and no need for urgent surgical intervention.   HOSPITAL COURSE BY PROBLEM LIST   Sepsis - RESOLVED  -Present on admission -due to cholecystitis -Presented with fever, tachycardia, and leukocytosis -Urine cultures of uncertain significance given they were collected from the patient's ileal conduit --initially on cefepime and metronidazole -Patient continued to be febrile up to 103.2 on 11/5 -Personally reviewed chest x-ray--no infiltrates or consolidations -11/4 and 11/6 blood cultures remain neg -11/6 spike another fever with elevated lactate>>2.5 L fluid and started vanc -11/6--personally reviewed CXR--bibasilar atelectasis -sepsis physiology now resolved   Acute Gangrenous cholecystitis -Appreciate general Surgery consult -Continue cefepime and metronidazole -Continue IV fluids -01/02/2021 RUQ US--borderline dilated GB, prominent sludge, areas of wall thickening, no CBD dilatation. -11/7  HIDA--Failure of the gallbladder to visualize at 4 hours consistent with cystic duct obstruction and acute cholecystitis -Post op s/p Lap cholecystectomy 11/8 -treated with IV  antibiotics  -DC home on oral augmentin x 1 week -Follow up with Dr. Arnoldo Morale in 1 week in the office    Proteus / Klebsiella Bacteruria -dubious clinical significance as urine is collected from ileal conduit   Essential hypertension -resume ARB therapy    Anxiety -Continue home dose lorazepam    Hyponatremia - RESOLVED  -due to volume depletion   Hypokalemia -repleted  -check mag--1.7   Deconditioning -PT eval>>HHPT    Status is: Inpatient   Remains inpatient appropriate because: Severity of illness requiring IV antibiotics and possible surgical intervention      Family Communication:   spouse updated at bedside 11/9, 11/10   Consultants: General surgery   Code Status:  FULL   DVT Prophylaxis:  [place] Heparin      Procedures: As Listed in Progress Note Above   Antibiotics: Cefepime 11/5>> Flagyl 11/5>> -vanc 11/6>>   Discharge Diagnoses:  Active Problems:   Essential hypertension   Acute cholecystitis   Empyema of gallbladder   Discharge Instructions:  Allergies as of 01/08/2021       Reactions   Percocet [oxycodone-acetaminophen]    Patient says he can take this for pain-just bothered his stomach a little bit before        Medication List     STOP taking these medications    azithromycin 250 MG tablet Commonly known as: Zithromax Z-Pak   cefdinir 300 MG capsule Commonly known as: OMNICEF   HYDROcodone-homatropine 5-1.5 MG/5ML syrup Commonly known as: HYCODAN   meclizine 25 MG tablet Commonly known as: ANTIVERT   predniSONE 10 MG (21) Tbpk tablet Commonly known as: STERAPRED UNI-PAK 21 TAB       TAKE these medications    amoxicillin-clavulanate 875-125 MG tablet Commonly known as: Augmentin Take 1 tablet by mouth 2 (two) times daily for 7 days. Start taking on: January 09, 2021   HYDROcodone-acetaminophen 5-325 MG tablet Commonly known as: NORCO/VICODIN Take 1 tablet by mouth every 6 (six) hours as needed for moderate  pain.   ibuprofen 200 MG tablet Commonly known as: ADVIL Take 600 mg by mouth every 6 (six) hours as needed.   LORazepam 1 MG tablet Commonly known as: ATIVAN Take 1 tablet by mouth 2 (two) times daily as needed.   ondansetron 4 MG disintegrating tablet Commonly known as: Zofran ODT 4mg  ODT q4 hours prn nausea/vomit What changed: Another medication with the same name was removed. Continue taking this medication, and follow the directions you see here.   telmisartan 80 MG tablet Commonly known as: MICARDIS Take 80 mg by mouth daily.        Follow-up Information     Aviva Signs, MD. Schedule an appointment as soon as possible for a visit on 01/15/2021.   Specialty: General Surgery Contact information: [address] [place] [address] [phone]         Monico Blitz, MD. Schedule an appointment as soon as possible for a visit in 2 week(s).   Specialty: Internal Medicine Why: Hospital Follow Up Contact information: [place] [address] [phone]                Allergies  Allergen Reactions   Percocet [Oxycodone-Acetaminophen]     Patient says he can take this for pain-just bothered his stomach a little bit before   Allergies  as of 01/08/2021       Reactions   Percocet [oxycodone-acetaminophen]    Patient says he can take this for pain-just bothered his stomach a little bit before        Medication List     STOP taking these medications    azithromycin 250 MG tablet Commonly known as: Zithromax Z-Pak   cefdinir 300 MG capsule Commonly known as: OMNICEF   HYDROcodone-homatropine 5-1.5 MG/5ML syrup Commonly known as: HYCODAN   meclizine 25 MG tablet Commonly known as: ANTIVERT   predniSONE 10 MG (21) Tbpk tablet Commonly known as: STERAPRED UNI-PAK 21 TAB       TAKE these medications    amoxicillin-clavulanate 875-125 MG tablet Commonly known as: Augmentin Take 1 tablet by mouth 2 (two) times daily for 7  days. Start taking on: January 09, 2021   HYDROcodone-acetaminophen 5-325 MG tablet Commonly known as: NORCO/VICODIN Take 1 tablet by mouth every 6 (six) hours as needed for moderate pain.   ibuprofen 200 MG tablet Commonly known as: ADVIL Take 600 mg by mouth every 6 (six) hours as needed.   LORazepam 1 MG tablet Commonly known as: ATIVAN Take 1 tablet by mouth 2 (two) times daily as needed.   ondansetron 4 MG disintegrating tablet Commonly known as: Zofran ODT 4mg  ODT q4 hours prn nausea/vomit What changed: Another medication with the same name was removed. Continue taking this medication, and follow the directions you see here.   telmisartan 80 MG tablet Commonly known as: MICARDIS Take 80 mg by mouth daily.        Procedures/Studies: CT HEAD WO CONTRAST  Result Date: 01/02/2021 CLINICAL DATA:  Mental status change, unknown cause EXAM: CT HEAD WITHOUT CONTRAST TECHNIQUE: Contiguous axial images were obtained from the base of the skull through the vertex without intravenous contrast. COMPARISON:  2013 FINDINGS: Brain: There is no acute intracranial hemorrhage, mass effect, or edema. Gray-white differentiation is preserved. There is no extra-axial fluid collection. Prominence of the ventricles and sulci reflects parenchymal volume loss. Patchy hypoattenuation in the supratentorial white matter is nonspecific but probably reflects moderate chronic microvascular ischemic changes. Vascular: There is atherosclerotic calcification at the skull base. Skull: Calvarium is unremarkable. Sinuses/Orbits: No acute finding. Other: None. IMPRESSION: No acute intracranial abnormality. Chronic microvascular ischemic changes. Electronically Signed   By: Macy Mis M.D.   On: 01/02/2021 15:34   CT Abdomen Pelvis W Contrast  Result Date: 12/30/2020 CLINICAL DATA:  Right-sided abdominal pain and nausea for 3 days. Prior surgery for bladder carcinoma. EXAM: CT ABDOMEN AND PELVIS WITH CONTRAST  TECHNIQUE: Multidetector CT imaging of the abdomen and pelvis was performed using the standard protocol following bolus administration of intravenous contrast. CONTRAST:  149mL OMNIPAQUE IOHEXOL 300 MG/ML  SOLN COMPARISON:  04/12/2017 from [facility] FINDINGS: Lower Chest: No acute findings. Hepatobiliary: No hepatic masses identified. Sub-cm cyst noted in the anterior right hepatic lobe. Distended gallbladder is seen with 4 mm gallstone in the gallbladder neck. Mild diffuse gallbladder wall thickening and pericholecystic soft tissue stranding are also seen, consistent with acute cholecystitis. No evidence of biliary ductal dilatation. Pancreas:  No mass or inflammatory changes. Spleen: Within normal limits in size and appearance. Adrenals/Urinary Tract: No masses identified. Small bilateral renal cysts are noted. No evidence of ureteral calculi or hydronephrosis. Stable postop changes from cystectomy with ileal loop diversion. Stomach/Bowel: Increased size of small to moderate hiatal hernia. No evidence of obstruction, inflammatory process or abnormal fluid collections. Diverticulosis is seen mainly involving  the descending and sigmoid colon, however there is no evidence of diverticulitis. 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 Vascular/Lymphatic: No pathologically enlarged lymph nodes. No acute vascular findings. Aortic atherosclerotic calcification noted. Reproductive:  Stable postop changes from cystoprostatectomy. Other: 2 small left paraumbilical ventral hernias are again seen, both containing only fat and without significant change compared to prior exam. Musculoskeletal:  No suspicious bone lesions identified. IMPRESSION: Findings consistent with acute cholecystitis. No evidence of biliary obstruction or other complication. Stable postop changes from cystoprostatectomy with ileal loop diversion. No evidence of recurrent or metastatic carcinoma. Increased  size of small to moderate hiatal hernia. Stable small left paraumbilical ventral hernias, which contain only fat. Colonic diverticulosis, without radiographic evidence of diverticulitis. Aortic Atherosclerosis (ICD10-I70.0). Electronically Signed   By: Marlaine Hind M.D.   On: 12/30/2020 11:55   NM Hepato W/EF  Result Date: 01/05/2021 CLINICAL DATA:  RIGHT upper quadrant abdominal pain, nausea, and vomiting; thickened gallbladder by CT and ultrasound, gallstone at lower gallbladder segment by CT, question cholecystitis EXAM: NUCLEAR MEDICINE HEPATOBILIARY IMAGING TECHNIQUE: Sequential images of the abdomen were obtained out to 60 minutes following intravenous administration of radiopharmaceutical. RADIOPHARMACEUTICALS:  5 mCi Tc-87m  Choletec IV COMPARISON:  CT abdomen and pelvis 12/30/2020, ultrasound abdomen RIGHT upper quadrant 01/02/2021 FINDINGS: Normal tracer extraction from bloodstream indicating normal hepatocellular function. Excretion of tracer into biliary tree with visualization of small-bowel at 12 minutes. At 1 hour, gallbladder had not visualized, though a photopenic region is seen at the gallbladder fossa. Delayed image at 4 hours was performed, since patient has a narcotic allergy to oxycodone and morphine was not administered. Gallbladder failed to visualized on delayed imaging consistent with cystic duct obstruction and acute cholecystitis. IMPRESSION: Failure of the gallbladder to visualize at 4 hours consistent with cystic duct obstruction and acute cholecystitis. Note that patients who are prolonged NPO can have false-positive HIDA scans for cholecystitis. Electronically Signed   By: Lavonia Dana M.D.   On: 01/05/2021 13:23   DG CHEST PORT 1 VIEW  Result Date: 01/04/2021 CLINICAL DATA:  Shortness of breath.  Fever. EXAM: PORTABLE CHEST 1 VIEW COMPARISON:  January 02, 2021 FINDINGS: Cardiomediastinal silhouette is stable. No pneumothorax. A right-sided pleural effusion with underlying  opacity remains. Left basilar opacity in the retrocardiac region remains. IMPRESSION: 1. Right pleural effusion with underlying opacity, possibly atelectasis. 2. Suspected retrocardiac opacity could represent atelectasis or infiltrate. 3. No other changes. Electronically Signed   By: Dorise Bullion III M.D.   On: 01/04/2021 14:51   DG Chest Port 1 View  Result Date: 01/02/2021 CLINICAL DATA:  Altered mental status, confusion, lethargy EXAM: PORTABLE CHEST 1 VIEW COMPARISON:  04/14/2018 FINDINGS: Lower lung volumes and elevated right hemidiaphragm. Increased streaky bibasilar opacities favored to be atelectasis, less likely pneumonia. Pleural thickening on the right. No large effusion or pneumothorax. Trachea midline. Mild cardiac enlargement without CHF. Bones are osteopenic. No acute osseous finding. Aorta atherosclerotic. IMPRESSION: Lower lung volumes with elevated right hemidiaphragm. Associated increased basilar atelectasis. Electronically Signed   By: Jerilynn Mages.  Shick M.D.   On: 01/02/2021 15:02   US Abdomen Limited RUQ (LIVER/GB)  Result Date: 01/02/2021 CLINICAL DATA:  Questionable gallstones on CT EXAM: ULTRASOUND ABDOMEN LIMITED RIGHT UPPER QUADRANT COMPARISON:  Ultrasound 12/30/2020, CT 12/30/2020 FINDINGS: Gallbladder: The gallbladder is borderline dilated with prominent intraluminal sludge. There are areas of gallbladder wall thickening. The stone seen on recent CT is not visualized sonographically. Common bile duct: Diameter: 4.8 mm  Liver: No focal lesion identified. Within normal limits in parenchymal echogenicity. Portal vein is patent on color Doppler imaging with normal direction of blood flow towards the liver. Other: None. IMPRESSION: Borderline dilated gallbladder with prominent intraluminal sludge and areas of gallbladder wall thickening. Gallstone seen on recent CT is not well seen sonographically. No common bile duct dilation. Given appearance on recent CT and ultrasound 3 days ago,  findings are again concerning for acute cholecystitis. Correlate with exam and laboratory findings. Electronically Signed   By: Maurine Simmering M.D.   On: 01/02/2021 15:41   US Abdomen Limited RUQ (LIVER/GB)  Result Date: 12/30/2020 CLINICAL DATA:  Right upper quadrant abdominal pain and nausea for 3 days. Gallstone in the neck of the gallbladder on CT scan today. EXAM: ULTRASOUND ABDOMEN LIMITED RIGHT UPPER QUADRANT COMPARISON:  CT scan 12/30/2020 FINDINGS: Gallbladder: Mild gallbladder wall thickening varying between 3-5 mm. There is sludge in the gallbladder but the stone seen in the neck of the gallbladder on CT is difficult to visualize on today's ultrasound. Sonographic Percell Miller sign is documented as being present. Trace pericholecystic fluid. Common bile duct: Diameter: 0.2 cm Liver: Equivocal accentuated hepatic echogenicity. No focal hepatic lesion identified. No intrahepatic biliary dilatation. Portal vein is patent on color Doppler imaging with normal direction of blood flow towards the liver. Other: None. IMPRESSION: 1. Gallbladder wall thickening with mild pericholecystic fluid and sonographic Murphy's sign present. The small stone in the neck of the gallbladder seen on CT is not as well appreciated on today's ultrasound. There is no CBD dilatation. Correlate clinically in assessing for acute cholecystitis. 2. Nonspecific and equivocal accentuated hepatic echogenicity diffusely, without focal hepatic lesion. Electronically Signed   By: Van Clines M.D.   On: 12/30/2020 13:54     Subjective: Pt reports that he is feeling much better and wants to go home.  He is tolerating his diet well.  He is having bowel movements.    Discharge Exam: Vitals:   01/07/21 2052 01/08/21 0535  BP: (!) 156/77 (!) 150/79  Pulse: 81 81  Resp: 20 18  Temp: 97.8 F (36.6 C) 98.5 F (36.9 C)  SpO2: 97% 95%   Vitals:   01/07/21 0414 01/07/21 1346 01/07/21 2052 01/08/21 0535  BP: (!) 158/85 (!) 146/77 (!)  156/77 (!) 150/79  Pulse: 73 75 81 81  Resp: 19 18 20 18   Temp: 98.2 F (36.8 C) 98.1 F (36.7 C) 97.8 F (36.6 C) 98.5 F (36.9 C)  TempSrc:  Oral Oral Oral  SpO2: 96% 96% 97% 95%  Weight:      Height:       General: Pt is alert, awake, not in acute distress Cardiovascular: normal S1/S2 +, no rubs, no gallops Respiratory: CTA bilaterally, no wheezing, no rhonchi Abdominal: Soft, NT, ND, bowel sounds + Extremities: no edema, no cyanosis   The results of significant diagnostics from this hospitalization (including imaging, microbiology, ancillary and laboratory) are listed below for reference.     Microbiology: Recent Results (from the past 240 hour(s))  Urine Culture     Status: Abnormal   Collection Time: 12/30/20 12:03 PM   Specimen: Urine, Clean Catch  Result Value Ref Range Status   Specimen Description   Final    URINE, CLEAN CATCH Performed at [facility], [address], [place], [place] [address]    Special Requests   Final    NONE Performed at [facility], [address], [place], [place] [address]    Culture (  A)  Final    >=100,000 COLONIES/mL PROTEUS MIRABILIS >=100,000 COLONIES/mL KLEBSIELLA OXYTOCA    Report Status 01/02/2021 FINAL  Final   Organism ID, Bacteria PROTEUS MIRABILIS (A)  Final   Organism ID, Bacteria KLEBSIELLA OXYTOCA (A)  Final      Susceptibility   Klebsiella oxytoca - MIC*    AMPICILLIN RESISTANT Resistant     CEFAZOLIN 16 SENSITIVE Sensitive     CEFEPIME <=0.12 SENSITIVE Sensitive     CEFTRIAXONE <=0.25 SENSITIVE Sensitive     CIPROFLOXACIN <=0.25 SENSITIVE Sensitive     GENTAMICIN <=1 SENSITIVE Sensitive     IMIPENEM <=0.25 SENSITIVE Sensitive     NITROFURANTOIN <=16 SENSITIVE Sensitive     TRIMETH/SULFA <=20 SENSITIVE Sensitive     AMPICILLIN/SULBACTAM 4 SENSITIVE Sensitive     PIP/TAZO <=4 SENSITIVE Sensitive     * >=100,000 COLONIES/mL KLEBSIELLA OXYTOCA   Proteus mirabilis - MIC*    AMPICILLIN <=2 SENSITIVE Sensitive      CEFAZOLIN <=4 SENSITIVE Sensitive     CEFEPIME <=0.12 SENSITIVE Sensitive     CEFTRIAXONE <=0.25 SENSITIVE Sensitive     CIPROFLOXACIN <=0.25 SENSITIVE Sensitive     GENTAMICIN <=1 SENSITIVE Sensitive     IMIPENEM <=0.25 SENSITIVE Sensitive     NITROFURANTOIN RESISTANT Resistant     TRIMETH/SULFA <=20 SENSITIVE Sensitive     AMPICILLIN/SULBACTAM <=2 SENSITIVE Sensitive     PIP/TAZO <=4 SENSITIVE Sensitive     * >=100,000 COLONIES/mL PROTEUS MIRABILIS  Resp Panel by RT-PCR (Flu A&B, Covid) Urine, Clean Catch     Status: None   Collection Time: 01/02/21  2:54 PM   Specimen: Urine, Clean Catch; Nasopharyngeal(NP) swabs in vial transport medium  Result Value Ref Range Status   SARS Coronavirus 2 by RT PCR NEGATIVE NEGATIVE Final    Comment: (NOTE) SARS-CoV-2 target nucleic acids are NOT DETECTED.  The SARS-CoV-2 RNA is generally detectable in upper respiratory specimens during the acute phase of infection. The lowest concentration of SARS-CoV-2 viral copies this assay can detect is 138 copies/mL. A negative result does not preclude SARS-Cov-2 infection and should not be used as the sole basis for treatment or other patient management decisions. A negative result may occur with  improper specimen collection/handling, submission of specimen other than nasopharyngeal swab, presence of viral mutation(s) within the areas targeted by this assay, and inadequate number of viral copies(<138 copies/mL). A negative result must be combined with clinical observations, patient history, and epidemiological information. The expected result is Negative.  Fact Sheet for Patients:  EntrepreneurPulse.com.au  Fact Sheet for Healthcare Providers:  IncredibleEmployment.be  This test is no t yet approved or cleared by the [place] FDA and  has been authorized for detection and/or diagnosis of SARS-CoV-2 by FDA under an Emergency Use Authorization (EUA). This EUA  will remain  in effect (meaning this test can be used) for the duration of the COVID-19 declaration under Section 564(b)(1) of the Act, 21 U.S.C.section 360bbb-3(b)(1), unless the authorization is terminated  or revoked sooner.       Influenza A by PCR NEGATIVE NEGATIVE Final   Influenza B by PCR NEGATIVE NEGATIVE Final    Comment: (NOTE) The Xpert Xpress SARS-CoV-2/FLU/RSV plus assay is intended as an aid in the diagnosis of influenza from Nasopharyngeal swab specimens and should not be used as a sole basis for treatment. Nasal washings and aspirates are unacceptable for Xpert Xpress SARS-CoV-2/FLU/RSV testing.  Fact Sheet for Patients: EntrepreneurPulse.com.au  Fact Sheet for Healthcare Providers: IncredibleEmployment.be  This test  is not yet approved or cleared by the [place] and has been authorized for detection and/or diagnosis of SARS-CoV-2 by FDA under an Emergency Use Authorization (EUA). This EUA will remain in effect (meaning this test can be used) for the duration of the COVID-19 declaration under Section 564(b)(1) of the Act, 21 U.S.C. section 360bbb-3(b)(1), unless the authorization is terminated or revoked.  Performed at [facility], [address] [place], [place] [address]   Blood Culture (routine x 2)     Status: None   Collection Time: 01/02/21  3:45 PM   Specimen: BLOOD RIGHT ARM  Result Value Ref Range Status   Specimen Description BLOOD RIGHT ARM  Final   Special Requests   Final    Blood Culture adequate volume BOTTLES DRAWN AEROBIC AND ANAEROBIC   Culture   Final    NO GROWTH 5 DAYS Performed at [facility], [address], [place], [place] [address]    Report Status 01/07/2021 FINAL  Final  Blood Culture (routine x 2)     Status: None   Collection Time: 01/02/21  3:49 PM   Specimen: Right Antecubital; Blood  Result Value Ref Range Status   Specimen Description RIGHT ANTECUBITAL  Final   Special  Requests   Final    Blood Culture results may not be optimal due to an excessive volume of blood received in culture bottles BOTTLES DRAWN AEROBIC AND ANAEROBIC   Culture   Final    NO GROWTH 5 DAYS Performed at [facility], [address], [place], [place] [address]    Report Status 01/07/2021 FINAL  Final  Urine Culture     Status: Abnormal   Collection Time: 01/02/21  8:25 PM   Specimen: Urine, Clean Catch  Result Value Ref Range Status   Specimen Description   Final    URINE, CLEAN CATCH Performed at [facility], [address], [place], [place] [address]    Special Requests   Final    NONE Performed at [facility], [address], [place], [place] [address]    Culture >=100,000 COLONIES/mL PROTEUS MIRABILIS (A)  Final   Report Status 01/05/2021 FINAL  Final   Organism ID, Bacteria PROTEUS MIRABILIS (A)  Final      Susceptibility   Proteus mirabilis - MIC*    AMPICILLIN <=2 SENSITIVE Sensitive     CEFAZOLIN <=4 SENSITIVE Sensitive     CEFEPIME <=0.12 SENSITIVE Sensitive     CEFTRIAXONE <=0.25 SENSITIVE Sensitive     CIPROFLOXACIN <=0.25 SENSITIVE Sensitive     GENTAMICIN <=1 SENSITIVE Sensitive     IMIPENEM <=0.25 SENSITIVE Sensitive     NITROFURANTOIN RESISTANT Resistant     TRIMETH/SULFA <=20 SENSITIVE Sensitive     AMPICILLIN/SULBACTAM <=2 SENSITIVE Sensitive     PIP/TAZO <=4 SENSITIVE Sensitive     * >=100,000 COLONIES/mL PROTEUS MIRABILIS  Culture, blood (Routine X 2) w Reflex to ID Panel     Status: None (Preliminary result)   Collection Time: 01/04/21  1:41 PM   Specimen: Right Antecubital; Blood  Result Value Ref Range Status   Specimen Description RIGHT ANTECUBITAL  Final   Special Requests   Final    BOTTLES DRAWN AEROBIC AND ANAEROBIC Blood Culture adequate volume   Culture   Final    NO GROWTH 4 DAYS Performed at [facility], [address], [place],  [address]    Report Status PENDING  Incomplete  Culture, blood (Routine X 2) w Reflex to ID  Panel  Status: None (Preliminary result)   Collection Time: 01/04/21  1:51 PM   Specimen: BLOOD RIGHT HAND  Result Value Ref Range Status   Specimen Description BLOOD RIGHT HAND  Final   Special Requests   Final    BOTTLES DRAWN AEROBIC AND ANAEROBIC Blood Culture results may not be optimal due to an excessive volume of blood received in culture bottles   Culture   Final    NO GROWTH 4 DAYS Performed at [facility], [address] [place], [place] [address]    Report Status PENDING  Incomplete     Labs: BNP (last 3 results) No results for input(s): BNP in the last 8760 hours. Basic Metabolic Panel: Recent Labs  Lab 01/04/21 0433 01/05/21 0421 01/06/21 0525 01/07/21 0513 01/08/21 0534  NA 133* 134* 135 136 137  K 3.5 3.4* 3.6 3.6 3.8  CL 103 104 107 104 107  CO2 22 22 19* 23 22  GLUCOSE 196* 130* 133* 166* 159*  BUN 23 22 18 18 15   CREATININE 1.03 1.01 0.84 0.93 0.98  CALCIUM 8.1* 7.9* 7.8* 7.6* 7.8*  MG  --  1.9 1.7 2.0 1.9  PHOS  --   --   --  2.6 2.0*   Liver Function Tests: Recent Labs  Lab 01/04/21 0433 01/05/21 0421 01/06/21 0525 01/07/21 0513 01/08/21 0534  AST 23 19 17 19 18   ALT 22 21 19 20 19   ALKPHOS 66 67 76 75 66  BILITOT 1.1 0.9 1.2 1.2 0.6  PROT 5.6* 5.3* 5.4* 5.6* 5.4*  ALBUMIN 2.3* 2.1* 2.1* 2.2* 2.3*   Recent Labs  Lab 01/02/21 1454  LIPASE 18   No results for input(s): AMMONIA in the last 168 hours. CBC: Recent Labs  Lab 01/02/21 1454 01/03/21 0457 01/04/21 0433 01/05/21 0421 01/06/21 0525 01/07/21 0513 01/08/21 0534  WBC 12.6*   < > 11.1* 12.5* 11.1* 15.3* 14.9*  NEUTROABS 10.4*  --   --   --  8.9*  --   --   HGB 12.3*   < > 11.2* 10.5* 11.3* 11.2* 11.0*  HCT 35.6*   < > 32.8* 31.7* 33.2* 32.2* 33.0*  MCV 94.9   < > 94.5 95.8 93.0 91.7 95.9  PLT 246   < > 204 183 240 313 383   < > = values in this interval not displayed.   Cardiac Enzymes: No results for input(s): CKTOTAL, CKMB, CKMBINDEX, TROPONINI in the last 168  hours. BNP: Invalid input(s): POCBNP CBG: No results for input(s): GLUCAP in the last 168 hours. D-Dimer No results for input(s): DDIMER in the last 72 hours. Hgb A1c No results for input(s): HGBA1C in the last 72 hours. Lipid Profile No results for input(s): CHOL, HDL, LDLCALC, TRIG, CHOLHDL, LDLDIRECT in the last 72 hours. Thyroid function studies No results for input(s): TSH, T4TOTAL, T3FREE, THYROIDAB in the last 72 hours.  Invalid input(s): FREET3 Anemia work up No results for input(s): VITAMINB12, FOLATE, FERRITIN, TIBC, IRON, RETICCTPCT in the last 72 hours. Urinalysis    Component Value Date/Time   COLORURINE YELLOW 01/02/2021 1454   APPEARANCEUR HAZY (A) 01/02/2021 1454   LABSPEC 1.012 01/02/2021 1454   PHURINE 9.0 (H) 01/02/2021 1454   GLUCOSEU NEGATIVE 01/02/2021 1454   HGBUR NEGATIVE 01/02/2021 1454   BILIRUBINUR NEGATIVE 01/02/2021 1454   KETONESUR NEGATIVE 01/02/2021 1454   PROTEINUR 100 (A) 01/02/2021 1454   UROBILINOGEN 0.2 04/21/2012 0200   NITRITE POSITIVE (A) 01/02/2021 1454   LEUKOCYTESUR TRACE (A) 01/02/2021 1454  Sepsis Labs Invalid input(s): PROCALCITONIN,  WBC,  LACTICIDVEN Microbiology Recent Results (from the past 240 hour(s))  Urine Culture     Status: Abnormal   Collection Time: 12/30/20 12:03 PM   Specimen: Urine, Clean Catch  Result Value Ref Range Status   Specimen Description   Final    URINE, CLEAN CATCH Performed at [facility], [address] [place], [place] [address]    Special Requests   Final    NONE Performed at [facility], [address] [place], [place] [address]    Culture (A)  Final    >=100,000 COLONIES/mL PROTEUS MIRABILIS >=100,000 COLONIES/mL KLEBSIELLA OXYTOCA    Report Status 01/02/2021 FINAL  Final   Organism ID, Bacteria PROTEUS MIRABILIS (A)  Final   Organism ID, Bacteria KLEBSIELLA OXYTOCA (A)  Final      Susceptibility   Klebsiella oxytoca - MIC*    AMPICILLIN RESISTANT Resistant     CEFAZOLIN 16  SENSITIVE Sensitive     CEFEPIME <=0.12 SENSITIVE Sensitive     CEFTRIAXONE <=0.25 SENSITIVE Sensitive     CIPROFLOXACIN <=0.25 SENSITIVE Sensitive     GENTAMICIN <=1 SENSITIVE Sensitive     IMIPENEM <=0.25 SENSITIVE Sensitive     NITROFURANTOIN <=16 SENSITIVE Sensitive     TRIMETH/SULFA <=20 SENSITIVE Sensitive     AMPICILLIN/SULBACTAM 4 SENSITIVE Sensitive     PIP/TAZO <=4 SENSITIVE Sensitive     * >=100,000 COLONIES/mL KLEBSIELLA OXYTOCA   Proteus mirabilis - MIC*    AMPICILLIN <=2 SENSITIVE Sensitive     CEFAZOLIN <=4 SENSITIVE Sensitive     CEFEPIME <=0.12 SENSITIVE Sensitive     CEFTRIAXONE <=0.25 SENSITIVE Sensitive     CIPROFLOXACIN <=0.25 SENSITIVE Sensitive     GENTAMICIN <=1 SENSITIVE Sensitive     IMIPENEM <=0.25 SENSITIVE Sensitive     NITROFURANTOIN RESISTANT Resistant     TRIMETH/SULFA <=20 SENSITIVE Sensitive     AMPICILLIN/SULBACTAM <=2 SENSITIVE Sensitive     PIP/TAZO <=4 SENSITIVE Sensitive     * >=100,000 COLONIES/mL PROTEUS MIRABILIS  Resp Panel by RT-PCR (Flu A&B, Covid) Urine, Clean Catch     Status: None   Collection Time: 01/02/21  2:54 PM   Specimen: Urine, Clean Catch; Nasopharyngeal(NP) swabs in vial transport medium  Result Value Ref Range Status   SARS Coronavirus 2 by RT PCR NEGATIVE NEGATIVE Final    Comment: (NOTE) SARS-CoV-2 target nucleic acids are NOT DETECTED.  The SARS-CoV-2 RNA is generally detectable in upper respiratory specimens during the acute phase of infection. The lowest concentration of SARS-CoV-2 viral copies this assay can detect is 138 copies/mL. A negative result does not preclude SARS-Cov-2 infection and should not be used as the sole basis for treatment or other patient management decisions. A negative result may occur with  improper specimen collection/handling, submission of specimen other than nasopharyngeal swab, presence of viral mutation(s) within the areas targeted by this assay, and inadequate number of  viral copies(<138 copies/mL). A negative result must be combined with clinical observations, patient history, and epidemiological information. The expected result is Negative.  Fact Sheet for Patients:  EntrepreneurPulse.com.au  Fact Sheet for Healthcare Providers:  IncredibleEmployment.be  This test is no t yet approved or cleared by the [place] FDA and  has been authorized for detection and/or diagnosis of SARS-CoV-2 by FDA under an Emergency Use Authorization (EUA). This EUA will remain  in effect (meaning this test can be used) for the duration of the COVID-19 declaration under Section 564(b)(1) of the Act, 21  U.S.C.section 360bbb-3(b)(1), unless the authorization is terminated  or revoked sooner.       Influenza A by PCR NEGATIVE NEGATIVE Final   Influenza B by PCR NEGATIVE NEGATIVE Final    Comment: (NOTE) The Xpert Xpress SARS-CoV-2/FLU/RSV plus assay is intended as an aid in the diagnosis of influenza from Nasopharyngeal swab specimens and should not be used as a sole basis for treatment. Nasal washings and aspirates are unacceptable for Xpert Xpress SARS-CoV-2/FLU/RSV testing.  Fact Sheet for Patients: EntrepreneurPulse.com.au  Fact Sheet for Healthcare Providers: IncredibleEmployment.be  This test is not yet approved or cleared by the [place] FDA and has been authorized for detection and/or diagnosis of SARS-CoV-2 by FDA under an Emergency Use Authorization (EUA). This EUA will remain in effect (meaning this test can be used) for the duration of the COVID-19 declaration under Section 564(b)(1) of the Act, 21 U.S.C. section 360bbb-3(b)(1), unless the authorization is terminated or revoked.  Performed at [facility], [address] [place], [place] [address]   Blood Culture (routine x 2)     Status: None   Collection Time: 01/02/21  3:45 PM   Specimen: BLOOD RIGHT ARM  Result  Value Ref Range Status   Specimen Description BLOOD RIGHT ARM  Final   Special Requests   Final    Blood Culture adequate volume BOTTLES DRAWN AEROBIC AND ANAEROBIC   Culture   Final    NO GROWTH 5 DAYS Performed at [facility], [address], [place], [place] [address]    Report Status 01/07/2021 FINAL  Final  Blood Culture (routine x 2)     Status: None   Collection Time: 01/02/21  3:49 PM   Specimen: Right Antecubital; Blood  Result Value Ref Range Status   Specimen Description RIGHT ANTECUBITAL  Final   Special Requests   Final    Blood Culture results may not be optimal due to an excessive volume of blood received in culture bottles BOTTLES DRAWN AEROBIC AND ANAEROBIC   Culture   Final    NO GROWTH 5 DAYS Performed at [facility], [address], [place], [place] [address]    Report Status 01/07/2021 FINAL  Final  Urine Culture     Status: Abnormal   Collection Time: 01/02/21  8:25 PM   Specimen: Urine, Clean Catch  Result Value Ref Range Status   Specimen Description   Final    URINE, CLEAN CATCH Performed at [facility], [address], [place], [place] [address]    Special Requests   Final    NONE Performed at [facility], [address], [place], [place] [address]    Culture >=100,000 COLONIES/mL PROTEUS MIRABILIS (A)  Final   Report Status 01/05/2021 FINAL  Final   Organism ID, Bacteria PROTEUS MIRABILIS (A)  Final      Susceptibility   Proteus mirabilis - MIC*    AMPICILLIN <=2 SENSITIVE Sensitive     CEFAZOLIN <=4 SENSITIVE Sensitive     CEFEPIME <=0.12 SENSITIVE Sensitive     CEFTRIAXONE <=0.25 SENSITIVE Sensitive     CIPROFLOXACIN <=0.25 SENSITIVE Sensitive     GENTAMICIN <=1 SENSITIVE Sensitive     IMIPENEM <=0.25 SENSITIVE Sensitive     NITROFURANTOIN RESISTANT Resistant     TRIMETH/SULFA <=20 SENSITIVE Sensitive     AMPICILLIN/SULBACTAM <=2 SENSITIVE Sensitive     PIP/TAZO <=4 SENSITIVE Sensitive     * >=100,000 COLONIES/mL PROTEUS MIRABILIS   Culture, blood (Routine X 2) w Reflex to ID Panel  Status: None (Preliminary result)   Collection Time: 01/04/21  1:41 PM   Specimen: Right Antecubital; Blood  Result Value Ref Range Status   Specimen Description RIGHT ANTECUBITAL  Final   Special Requests   Final    BOTTLES DRAWN AEROBIC AND ANAEROBIC Blood Culture adequate volume   Culture   Final    NO GROWTH 4 DAYS Performed at [facility], [address] [place], [place] [address]    Report Status PENDING  Incomplete  Culture, blood (Routine X 2) w Reflex to ID Panel     Status: None (Preliminary result)   Collection Time: 01/04/21  1:51 PM   Specimen: BLOOD RIGHT HAND  Result Value Ref Range Status   Specimen Description BLOOD RIGHT HAND  Final   Special Requests   Final    BOTTLES DRAWN AEROBIC AND ANAEROBIC Blood Culture results may not be optimal due to an excessive volume of blood received in culture bottles   Culture   Final    NO GROWTH 4 DAYS Performed at [facility], [address], [place], [place] [address]    Report Status PENDING  Incomplete    Time coordinating discharge: 35 mins   SIGNED:  Irwin Brakeman, MD  [facility] 01/08/2021, 10:03 AM How to contact the [facility] Attending or Consulting provider [place] or covering provider during after hours [place], for this patient?  Check the care team in [facility] and look for a) attending/consulting TRH provider listed and b) the [facility], The team listed Log into [email or web address] and use [place] universal password to access. If you do not have the password, please contact the hospital operator. Locate the [facility] provider you are looking for under [facility] and page to a number that you can be directly reached. If you still have difficulty reaching the provider, please page the [facility] (Director on Call) for the Hospitalists listed on amion for assistance.

## 2021-01-08 NOTE — Discharge Instructions (Signed)

## 2021-01-08 NOTE — Progress Notes (Signed)
Discharge instructions, RXs and follow appts explained and provided to patient and wife verbalized understanding. Home health ordered  patient didn't want to wait for SW to set up, let SW know and provided with contact information. Patient left floor via wheelchair accompanied by staff.  Glenola Wheat, Tivis Ringer, RN

## 2021-01-08 NOTE — TOC Transition Note (Signed)
Transition of Care [facility]) - CM/SW Discharge Note   Patient Details  Name: Benjamin Yang MRN: [id number] Date of Birth: 1935-06-24  [facility] CM/SW Contact:  Natasha Bence, LCSW Phone Number: 01/08/2021, 4:18 PM   Clinical Narrative:    CSW notified of patient's readiness for discharge and [place] needs. CSW referred patient to [place] with Alvis Lemmings. Georgina Snell agreeable to take patient. TOC signing off.    Final next level of care: [place] Barriers to Discharge: Barriers Resolved   Patient Goals and CMS Choice Patient states their goals for this hospitalization and ongoing recovery are:: Return home with [facility] CMS Medicare.gov Compare Post Acute Care list provided to:: Patient Choice offered to / list presented to : Patient  Discharge Placement                    Patient and family notified of of transfer: 01/08/21  Discharge Plan and Services                          HH Arranged: RN, PT [facility] Agency: [place] Date [place]: 01/08/21 Time [place]: 0735 Representative spoke with at [place]: [place] ([place]) Interventions     Readmission Risk Interventions No flowsheet data found.

## 2021-01-08 NOTE — Plan of Care (Signed)
  Problem: Education: Goal: Knowledge of General Education information will improve Description: Including pain rating scale, medication(s)/side effects and non-pharmacologic comfort measures Outcome: Adequate for Discharge   Problem: Health Behavior/Discharge Planning: Goal: Ability to manage health-related needs will improve Outcome: Adequate for Discharge   Problem: Clinical Measurements: Goal: Ability to maintain clinical measurements within normal limits will improve Outcome: Adequate for Discharge Goal: Will remain free from infection Outcome: Adequate for Discharge Goal: Diagnostic test results will improve Outcome: Adequate for Discharge Goal: Respiratory complications will improve Outcome: Adequate for Discharge Goal: Cardiovascular complication will be avoided Outcome: Adequate for Discharge   Problem: Activity: Goal: Risk for activity intolerance will decrease Outcome: Adequate for Discharge   Problem: Nutrition: Goal: Adequate nutrition will be maintained Outcome: Adequate for Discharge   Problem: Coping: Goal: Level of anxiety will decrease Outcome: Adequate for Discharge   Problem: Elimination: Goal: Will not experience complications related to bowel motility Outcome: Adequate for Discharge Goal: Will not experience complications related to urinary retention Outcome: Adequate for Discharge   Problem: Pain Managment: Goal: General experience of comfort will improve Outcome: Adequate for Discharge   Problem: Safety: Goal: Ability to remain free from injury will improve Outcome: Adequate for Discharge   Problem: Skin Integrity: Goal: Risk for impaired skin integrity will decrease Outcome: Adequate for Discharge   Problem: Acute Rehab PT Goals(only PT should resolve) Goal: Pt Will Go Supine/Side To Sit Outcome: Adequate for Discharge Goal: Pt Will Go Sit To Supine/Side Outcome: Adequate for Discharge Goal: Patient Will Transfer Sit To/From  Stand Outcome: Adequate for Discharge Goal: Pt Will Ambulate Outcome: Adequate for Discharge   

## 2021-01-08 NOTE — Plan of Care (Signed)

## 2021-01-08 NOTE — Progress Notes (Signed)
2 Days Post-Op  Subjective: Feels much better and is tolerating diet well.  Has had multiple bowel movements after MiraLAX.  Objective: Vital signs in last 24 hours: Temp:  [97.8 F (36.6 C)-98.5 F (36.9 C)] 98.5 F (36.9 C) (11/10 0535) Pulse Rate:  [75-81] 81 (11/10 0535) Resp:  [18-20] 18 (11/10 0535) BP: (146-156)/(77-79) 150/79 (11/10 0535) SpO2:  [95 %-97 %] 95 % (11/10 0535) Last BM Date: 01/07/21  Intake/Output from previous day: 11/09 0701 - 11/10 0700 In: 1965 [P.O.:820; IV Piggyback:1145] Out: 2705 [Urine:2625; Drains:80] Intake/Output this shift: No intake/output data recorded.  General appearance: alert, cooperative, and no distress GI: Soft, incisions healing well.  JP drainage serosanguineous in nature.  Lab Results:  Recent Labs    01/07/21 0513 01/08/21 0534  WBC 15.3* 14.9*  HGB 11.2* 11.0*  HCT 32.2* 33.0*  PLT 313 383   BMET Recent Labs    01/07/21 0513 01/08/21 0534  NA 136 137  K 3.6 3.8  CL 104 107  CO2 23 22  GLUCOSE 166* 159*  BUN 18 15  CREATININE 0.93 0.98  CALCIUM 7.6* 7.8*   PT/INR No results for input(s): LABPROT, INR in the last 72 hours.  Studies/Results: No results found.  Anti-infectives: Anti-infectives (From admission, onward)    Start     Dose/Rate Route Frequency Ordered Stop   01/06/21 1106  vancomycin HCl (VANCOREADY) 1000 MG/200ML IVPB       Note to Pharmacy: Charm Barges   : cabinet override      01/06/21 1106 01/06/21 2211   01/05/21 1000  vancomycin (VANCOREADY) IVPB 1000 mg/200 mL        1,000 mg 200 mL/hr over 60 Minutes Intravenous Every 12 hours 01/04/21 1912     01/04/21 2000  vancomycin (VANCOREADY) IVPB 1500 mg/300 mL        1,500 mg 150 mL/hr over 120 Minutes Intravenous  Once 01/04/21 1908 01/04/21 2350   01/03/21 0600  vancomycin (VANCOREADY) IVPB 1250 mg/250 mL  Status:  Discontinued        1,250 mg 166.7 mL/hr over 90 Minutes Intravenous Every 18 hours 01/02/21 1605 01/02/21 2026    01/03/21 0500  metroNIDAZOLE (FLAGYL) IVPB 500 mg        500 mg 100 mL/hr over 60 Minutes Intravenous Every 12 hours 01/02/21 2016     01/03/21 0100  ceFEPIme (MAXIPIME) 2 g in sodium chloride 0.9 % 100 mL IVPB        2 g 200 mL/hr over 30 Minutes Intravenous Every 8 hours 01/02/21 1605     01/02/21 1530  ceFEPIme (MAXIPIME) 2 g in sodium chloride 0.9 % 100 mL IVPB        2 g 200 mL/hr over 30 Minutes Intravenous  Once 01/02/21 1527 01/02/21 1638   01/02/21 1530  metroNIDAZOLE (FLAGYL) IVPB 500 mg        500 mg 100 mL/hr over 60 Minutes Intravenous  Once 01/02/21 1527 01/02/21 1718   01/02/21 1530  vancomycin (VANCOREADY) IVPB 1000 mg/200 mL        1,000 mg 200 mL/hr over 60 Minutes Intravenous  Once 01/02/21 1527 01/02/21 1744       Assessment/Plan: s/p Procedure(s): LAPAROSCOPIC CHOLECYSTECTOMY Impression: Stable on postoperative day 2.  Is ambulating with some assistance.  Tolerating diet well.  Okay for discharge from surgery standpoint.  I will see him in my office in 1 week.  Discussed with Dr. Wynetta Emery.  Would continue Augmentin for 1 week due  to empyema of the gallbladder.  LOS: 6 days    Aviva Signs 01/08/2021

## 2021-01-09 ENCOUNTER — Encounter: Payer: Self-pay | Admitting: Family Medicine

## 2021-01-09 LAB — CULTURE, BLOOD (ROUTINE X 2)
Culture: NO GROWTH
Culture: NO GROWTH
Special Requests: ADEQUATE

## 2021-01-09 NOTE — Telephone Encounter (Signed)
This encounter was created in error - please disregard.

## 2021-01-11 DIAGNOSIS — B964 Proteus (mirabilis) (morganii) as the cause of diseases classified elsewhere: Secondary | ICD-10-CM | POA: Diagnosis not present

## 2021-01-11 DIAGNOSIS — Z936 Other artificial openings of urinary tract status: Secondary | ICD-10-CM | POA: Diagnosis not present

## 2021-01-11 DIAGNOSIS — E876 Hypokalemia: Secondary | ICD-10-CM | POA: Diagnosis not present

## 2021-01-11 DIAGNOSIS — I10 Essential (primary) hypertension: Secondary | ICD-10-CM | POA: Diagnosis not present

## 2021-01-11 DIAGNOSIS — F419 Anxiety disorder, unspecified: Secondary | ICD-10-CM | POA: Diagnosis not present

## 2021-01-11 DIAGNOSIS — G629 Polyneuropathy, unspecified: Secondary | ICD-10-CM | POA: Diagnosis not present

## 2021-01-11 DIAGNOSIS — M549 Dorsalgia, unspecified: Secondary | ICD-10-CM | POA: Diagnosis not present

## 2021-01-11 DIAGNOSIS — R8271 Bacteriuria: Secondary | ICD-10-CM | POA: Diagnosis not present

## 2021-01-11 DIAGNOSIS — B961 Klebsiella pneumoniae [K. pneumoniae] as the cause of diseases classified elsewhere: Secondary | ICD-10-CM | POA: Diagnosis not present

## 2021-01-11 DIAGNOSIS — Z9049 Acquired absence of other specified parts of digestive tract: Secondary | ICD-10-CM | POA: Diagnosis not present

## 2021-01-11 DIAGNOSIS — Z48815 Encounter for surgical aftercare following surgery on the digestive system: Secondary | ICD-10-CM | POA: Diagnosis not present

## 2021-01-11 DIAGNOSIS — Z9181 History of falling: Secondary | ICD-10-CM | POA: Diagnosis not present

## 2021-01-11 DIAGNOSIS — Z8551 Personal history of malignant neoplasm of bladder: Secondary | ICD-10-CM | POA: Diagnosis not present

## 2021-01-11 DIAGNOSIS — I700 Atherosclerosis of aorta: Secondary | ICD-10-CM | POA: Diagnosis not present

## 2021-01-11 DIAGNOSIS — Z9079 Acquired absence of other genital organ(s): Secondary | ICD-10-CM | POA: Diagnosis not present

## 2021-01-13 DIAGNOSIS — F419 Anxiety disorder, unspecified: Secondary | ICD-10-CM | POA: Diagnosis not present

## 2021-01-13 DIAGNOSIS — Z48815 Encounter for surgical aftercare following surgery on the digestive system: Secondary | ICD-10-CM | POA: Diagnosis not present

## 2021-01-13 DIAGNOSIS — B961 Klebsiella pneumoniae [K. pneumoniae] as the cause of diseases classified elsewhere: Secondary | ICD-10-CM | POA: Diagnosis not present

## 2021-01-13 DIAGNOSIS — I10 Essential (primary) hypertension: Secondary | ICD-10-CM | POA: Diagnosis not present

## 2021-01-13 DIAGNOSIS — R8271 Bacteriuria: Secondary | ICD-10-CM | POA: Diagnosis not present

## 2021-01-13 DIAGNOSIS — B964 Proteus (mirabilis) (morganii) as the cause of diseases classified elsewhere: Secondary | ICD-10-CM | POA: Diagnosis not present

## 2021-01-15 ENCOUNTER — Encounter: Payer: Self-pay | Admitting: General Surgery

## 2021-01-15 ENCOUNTER — Other Ambulatory Visit: Payer: Self-pay

## 2021-01-15 ENCOUNTER — Ambulatory Visit (INDEPENDENT_AMBULATORY_CARE_PROVIDER_SITE_OTHER): Payer: Medicare Other | Admitting: General Surgery

## 2021-01-15 VITALS — BP 147/77 | HR 72 | Temp 98.3°F | Resp 16 | Ht 74.0 in | Wt 195.0 lb

## 2021-01-15 DIAGNOSIS — Z09 Encounter for follow-up examination after completed treatment for conditions other than malignant neoplasm: Secondary | ICD-10-CM

## 2021-01-15 NOTE — Progress Notes (Signed)
Subjective:     Benjamin Yang  Patient here for postoperative visit, status post laparoscopic cholecystectomy with drain placement for empyema of the gallbladder.  He states he is doing well.  His energy level is slowly improving.  He is getting home physical therapy.  He denies any fever or chills.  The drainage from his JP drain is minimal and serous in nature. Objective:    BP (!) 147/77   Pulse 72   Temp 98.3 F (36.8 C) (Oral)   Resp 16   Ht 6\' 2"  (1.88 m)   Wt 195 lb (88.5 kg)   SpO2 97%   BMI 25.04 kg/m   General:  alert, cooperative, and no distress  Abdomen soft, incisions healing well.  Staples removed.  JP drain removed.     Assessment:    Doing well postoperatively.    Plan:   Increase activity as able.  Follow-up here as needed.

## 2021-01-16 DIAGNOSIS — B964 Proteus (mirabilis) (morganii) as the cause of diseases classified elsewhere: Secondary | ICD-10-CM | POA: Diagnosis not present

## 2021-01-16 DIAGNOSIS — F419 Anxiety disorder, unspecified: Secondary | ICD-10-CM | POA: Diagnosis not present

## 2021-01-16 DIAGNOSIS — B961 Klebsiella pneumoniae [K. pneumoniae] as the cause of diseases classified elsewhere: Secondary | ICD-10-CM | POA: Diagnosis not present

## 2021-01-16 DIAGNOSIS — Z48815 Encounter for surgical aftercare following surgery on the digestive system: Secondary | ICD-10-CM | POA: Diagnosis not present

## 2021-01-16 DIAGNOSIS — R8271 Bacteriuria: Secondary | ICD-10-CM | POA: Diagnosis not present

## 2021-01-16 DIAGNOSIS — I10 Essential (primary) hypertension: Secondary | ICD-10-CM | POA: Diagnosis not present

## 2021-01-19 DIAGNOSIS — E1165 Type 2 diabetes mellitus with hyperglycemia: Secondary | ICD-10-CM | POA: Diagnosis not present

## 2021-01-19 DIAGNOSIS — Z9049 Acquired absence of other specified parts of digestive tract: Secondary | ICD-10-CM | POA: Diagnosis not present

## 2021-01-19 DIAGNOSIS — Z299 Encounter for prophylactic measures, unspecified: Secondary | ICD-10-CM | POA: Diagnosis not present

## 2021-01-19 DIAGNOSIS — E7800 Pure hypercholesterolemia, unspecified: Secondary | ICD-10-CM | POA: Diagnosis not present

## 2021-01-19 DIAGNOSIS — I10 Essential (primary) hypertension: Secondary | ICD-10-CM | POA: Diagnosis not present

## 2021-01-20 DIAGNOSIS — R8271 Bacteriuria: Secondary | ICD-10-CM | POA: Diagnosis not present

## 2021-01-20 DIAGNOSIS — Z48815 Encounter for surgical aftercare following surgery on the digestive system: Secondary | ICD-10-CM | POA: Diagnosis not present

## 2021-01-20 DIAGNOSIS — F419 Anxiety disorder, unspecified: Secondary | ICD-10-CM | POA: Diagnosis not present

## 2021-01-20 DIAGNOSIS — B964 Proteus (mirabilis) (morganii) as the cause of diseases classified elsewhere: Secondary | ICD-10-CM | POA: Diagnosis not present

## 2021-01-20 DIAGNOSIS — B961 Klebsiella pneumoniae [K. pneumoniae] as the cause of diseases classified elsewhere: Secondary | ICD-10-CM | POA: Diagnosis not present

## 2021-01-20 DIAGNOSIS — I10 Essential (primary) hypertension: Secondary | ICD-10-CM | POA: Diagnosis not present

## 2021-01-21 DIAGNOSIS — B964 Proteus (mirabilis) (morganii) as the cause of diseases classified elsewhere: Secondary | ICD-10-CM | POA: Diagnosis not present

## 2021-01-21 DIAGNOSIS — I10 Essential (primary) hypertension: Secondary | ICD-10-CM | POA: Diagnosis not present

## 2021-01-21 DIAGNOSIS — B961 Klebsiella pneumoniae [K. pneumoniae] as the cause of diseases classified elsewhere: Secondary | ICD-10-CM | POA: Diagnosis not present

## 2021-01-21 DIAGNOSIS — R8271 Bacteriuria: Secondary | ICD-10-CM | POA: Diagnosis not present

## 2021-01-27 DIAGNOSIS — Z48815 Encounter for surgical aftercare following surgery on the digestive system: Secondary | ICD-10-CM | POA: Diagnosis not present

## 2021-01-27 DIAGNOSIS — R8271 Bacteriuria: Secondary | ICD-10-CM | POA: Diagnosis not present

## 2021-01-27 DIAGNOSIS — B961 Klebsiella pneumoniae [K. pneumoniae] as the cause of diseases classified elsewhere: Secondary | ICD-10-CM | POA: Diagnosis not present

## 2021-01-27 DIAGNOSIS — I10 Essential (primary) hypertension: Secondary | ICD-10-CM | POA: Diagnosis not present

## 2021-01-27 DIAGNOSIS — F419 Anxiety disorder, unspecified: Secondary | ICD-10-CM | POA: Diagnosis not present

## 2021-01-27 DIAGNOSIS — B964 Proteus (mirabilis) (morganii) as the cause of diseases classified elsewhere: Secondary | ICD-10-CM | POA: Diagnosis not present

## 2021-01-29 DIAGNOSIS — B961 Klebsiella pneumoniae [K. pneumoniae] as the cause of diseases classified elsewhere: Secondary | ICD-10-CM | POA: Diagnosis not present

## 2021-01-29 DIAGNOSIS — R8271 Bacteriuria: Secondary | ICD-10-CM | POA: Diagnosis not present

## 2021-01-29 DIAGNOSIS — B964 Proteus (mirabilis) (morganii) as the cause of diseases classified elsewhere: Secondary | ICD-10-CM | POA: Diagnosis not present

## 2021-01-29 DIAGNOSIS — F419 Anxiety disorder, unspecified: Secondary | ICD-10-CM | POA: Diagnosis not present

## 2021-01-29 DIAGNOSIS — Z48815 Encounter for surgical aftercare following surgery on the digestive system: Secondary | ICD-10-CM | POA: Diagnosis not present

## 2021-01-29 DIAGNOSIS — I10 Essential (primary) hypertension: Secondary | ICD-10-CM | POA: Diagnosis not present

## 2021-02-03 DIAGNOSIS — F419 Anxiety disorder, unspecified: Secondary | ICD-10-CM | POA: Diagnosis not present

## 2021-02-03 DIAGNOSIS — I10 Essential (primary) hypertension: Secondary | ICD-10-CM | POA: Diagnosis not present

## 2021-02-03 DIAGNOSIS — B964 Proteus (mirabilis) (morganii) as the cause of diseases classified elsewhere: Secondary | ICD-10-CM | POA: Diagnosis not present

## 2021-02-03 DIAGNOSIS — R8271 Bacteriuria: Secondary | ICD-10-CM | POA: Diagnosis not present

## 2021-02-03 DIAGNOSIS — B961 Klebsiella pneumoniae [K. pneumoniae] as the cause of diseases classified elsewhere: Secondary | ICD-10-CM | POA: Diagnosis not present

## 2021-02-03 DIAGNOSIS — Z48815 Encounter for surgical aftercare following surgery on the digestive system: Secondary | ICD-10-CM | POA: Diagnosis not present

## 2021-02-05 DIAGNOSIS — I10 Essential (primary) hypertension: Secondary | ICD-10-CM | POA: Diagnosis not present

## 2021-02-05 DIAGNOSIS — B964 Proteus (mirabilis) (morganii) as the cause of diseases classified elsewhere: Secondary | ICD-10-CM | POA: Diagnosis not present

## 2021-02-05 DIAGNOSIS — B961 Klebsiella pneumoniae [K. pneumoniae] as the cause of diseases classified elsewhere: Secondary | ICD-10-CM | POA: Diagnosis not present

## 2021-02-05 DIAGNOSIS — Z48815 Encounter for surgical aftercare following surgery on the digestive system: Secondary | ICD-10-CM | POA: Diagnosis not present

## 2021-02-05 DIAGNOSIS — R8271 Bacteriuria: Secondary | ICD-10-CM | POA: Diagnosis not present

## 2021-02-05 DIAGNOSIS — F419 Anxiety disorder, unspecified: Secondary | ICD-10-CM | POA: Diagnosis not present

## 2021-02-09 DIAGNOSIS — B961 Klebsiella pneumoniae [K. pneumoniae] as the cause of diseases classified elsewhere: Secondary | ICD-10-CM | POA: Diagnosis not present

## 2021-02-09 DIAGNOSIS — R8271 Bacteriuria: Secondary | ICD-10-CM | POA: Diagnosis not present

## 2021-02-09 DIAGNOSIS — F419 Anxiety disorder, unspecified: Secondary | ICD-10-CM | POA: Diagnosis not present

## 2021-02-09 DIAGNOSIS — I10 Essential (primary) hypertension: Secondary | ICD-10-CM | POA: Diagnosis not present

## 2021-02-09 DIAGNOSIS — Z48815 Encounter for surgical aftercare following surgery on the digestive system: Secondary | ICD-10-CM | POA: Diagnosis not present

## 2021-02-09 DIAGNOSIS — B964 Proteus (mirabilis) (morganii) as the cause of diseases classified elsewhere: Secondary | ICD-10-CM | POA: Diagnosis not present

## 2021-02-10 DIAGNOSIS — I10 Essential (primary) hypertension: Secondary | ICD-10-CM | POA: Diagnosis not present

## 2021-02-10 DIAGNOSIS — Z9049 Acquired absence of other specified parts of digestive tract: Secondary | ICD-10-CM | POA: Diagnosis not present

## 2021-02-10 DIAGNOSIS — E876 Hypokalemia: Secondary | ICD-10-CM | POA: Diagnosis not present

## 2021-02-10 DIAGNOSIS — B961 Klebsiella pneumoniae [K. pneumoniae] as the cause of diseases classified elsewhere: Secondary | ICD-10-CM | POA: Diagnosis not present

## 2021-02-10 DIAGNOSIS — Z936 Other artificial openings of urinary tract status: Secondary | ICD-10-CM | POA: Diagnosis not present

## 2021-02-10 DIAGNOSIS — Z9181 History of falling: Secondary | ICD-10-CM | POA: Diagnosis not present

## 2021-02-10 DIAGNOSIS — G629 Polyneuropathy, unspecified: Secondary | ICD-10-CM | POA: Diagnosis not present

## 2021-02-10 DIAGNOSIS — I700 Atherosclerosis of aorta: Secondary | ICD-10-CM | POA: Diagnosis not present

## 2021-02-10 DIAGNOSIS — Z9079 Acquired absence of other genital organ(s): Secondary | ICD-10-CM | POA: Diagnosis not present

## 2021-02-10 DIAGNOSIS — F419 Anxiety disorder, unspecified: Secondary | ICD-10-CM | POA: Diagnosis not present

## 2021-02-10 DIAGNOSIS — B964 Proteus (mirabilis) (morganii) as the cause of diseases classified elsewhere: Secondary | ICD-10-CM | POA: Diagnosis not present

## 2021-02-10 DIAGNOSIS — Z8551 Personal history of malignant neoplasm of bladder: Secondary | ICD-10-CM | POA: Diagnosis not present

## 2021-02-10 DIAGNOSIS — M549 Dorsalgia, unspecified: Secondary | ICD-10-CM | POA: Diagnosis not present

## 2021-02-10 DIAGNOSIS — R8271 Bacteriuria: Secondary | ICD-10-CM | POA: Diagnosis not present

## 2021-02-10 DIAGNOSIS — Z48815 Encounter for surgical aftercare following surgery on the digestive system: Secondary | ICD-10-CM | POA: Diagnosis not present

## 2021-02-16 DIAGNOSIS — R8271 Bacteriuria: Secondary | ICD-10-CM | POA: Diagnosis not present

## 2021-02-16 DIAGNOSIS — B964 Proteus (mirabilis) (morganii) as the cause of diseases classified elsewhere: Secondary | ICD-10-CM | POA: Diagnosis not present

## 2021-02-16 DIAGNOSIS — Z48815 Encounter for surgical aftercare following surgery on the digestive system: Secondary | ICD-10-CM | POA: Diagnosis not present

## 2021-02-16 DIAGNOSIS — B961 Klebsiella pneumoniae [K. pneumoniae] as the cause of diseases classified elsewhere: Secondary | ICD-10-CM | POA: Diagnosis not present

## 2021-02-16 DIAGNOSIS — F419 Anxiety disorder, unspecified: Secondary | ICD-10-CM | POA: Diagnosis not present

## 2021-02-16 DIAGNOSIS — I10 Essential (primary) hypertension: Secondary | ICD-10-CM | POA: Diagnosis not present

## 2021-02-26 DIAGNOSIS — I10 Essential (primary) hypertension: Secondary | ICD-10-CM | POA: Diagnosis not present

## 2021-02-26 DIAGNOSIS — B964 Proteus (mirabilis) (morganii) as the cause of diseases classified elsewhere: Secondary | ICD-10-CM | POA: Diagnosis not present

## 2021-02-26 DIAGNOSIS — B961 Klebsiella pneumoniae [K. pneumoniae] as the cause of diseases classified elsewhere: Secondary | ICD-10-CM | POA: Diagnosis not present

## 2021-02-26 DIAGNOSIS — F419 Anxiety disorder, unspecified: Secondary | ICD-10-CM | POA: Diagnosis not present

## 2021-02-26 DIAGNOSIS — Z48815 Encounter for surgical aftercare following surgery on the digestive system: Secondary | ICD-10-CM | POA: Diagnosis not present

## 2021-02-26 DIAGNOSIS — R8271 Bacteriuria: Secondary | ICD-10-CM | POA: Diagnosis not present

## 2021-03-04 DIAGNOSIS — B961 Klebsiella pneumoniae [K. pneumoniae] as the cause of diseases classified elsewhere: Secondary | ICD-10-CM | POA: Diagnosis not present

## 2021-03-04 DIAGNOSIS — R8271 Bacteriuria: Secondary | ICD-10-CM | POA: Diagnosis not present

## 2021-03-04 DIAGNOSIS — B964 Proteus (mirabilis) (morganii) as the cause of diseases classified elsewhere: Secondary | ICD-10-CM | POA: Diagnosis not present

## 2021-03-04 DIAGNOSIS — I10 Essential (primary) hypertension: Secondary | ICD-10-CM | POA: Diagnosis not present

## 2021-03-04 DIAGNOSIS — Z48815 Encounter for surgical aftercare following surgery on the digestive system: Secondary | ICD-10-CM | POA: Diagnosis not present

## 2021-03-04 DIAGNOSIS — F419 Anxiety disorder, unspecified: Secondary | ICD-10-CM | POA: Diagnosis not present

## 2021-03-13 DIAGNOSIS — Z789 Other specified health status: Secondary | ICD-10-CM | POA: Diagnosis not present

## 2021-03-13 DIAGNOSIS — Z299 Encounter for prophylactic measures, unspecified: Secondary | ICD-10-CM | POA: Diagnosis not present

## 2021-03-13 DIAGNOSIS — Z6825 Body mass index (BMI) 25.0-25.9, adult: Secondary | ICD-10-CM | POA: Diagnosis not present

## 2021-03-13 DIAGNOSIS — I10 Essential (primary) hypertension: Secondary | ICD-10-CM | POA: Diagnosis not present

## 2021-03-13 DIAGNOSIS — Z936 Other artificial openings of urinary tract status: Secondary | ICD-10-CM | POA: Diagnosis not present

## 2021-03-13 DIAGNOSIS — K5900 Constipation, unspecified: Secondary | ICD-10-CM | POA: Diagnosis not present

## 2021-03-13 DIAGNOSIS — E1165 Type 2 diabetes mellitus with hyperglycemia: Secondary | ICD-10-CM | POA: Diagnosis not present

## 2021-05-15 DIAGNOSIS — Z20822 Contact with and (suspected) exposure to covid-19: Secondary | ICD-10-CM | POA: Diagnosis not present

## 2021-06-03 DIAGNOSIS — Z299 Encounter for prophylactic measures, unspecified: Secondary | ICD-10-CM | POA: Diagnosis not present

## 2021-06-03 DIAGNOSIS — I700 Atherosclerosis of aorta: Secondary | ICD-10-CM | POA: Diagnosis not present

## 2021-06-03 DIAGNOSIS — E1129 Type 2 diabetes mellitus with other diabetic kidney complication: Secondary | ICD-10-CM | POA: Diagnosis not present

## 2021-06-03 DIAGNOSIS — I10 Essential (primary) hypertension: Secondary | ICD-10-CM | POA: Diagnosis not present

## 2021-06-03 DIAGNOSIS — E1142 Type 2 diabetes mellitus with diabetic polyneuropathy: Secondary | ICD-10-CM | POA: Diagnosis not present

## 2021-06-03 DIAGNOSIS — E1165 Type 2 diabetes mellitus with hyperglycemia: Secondary | ICD-10-CM | POA: Diagnosis not present

## 2021-06-03 DIAGNOSIS — Z789 Other specified health status: Secondary | ICD-10-CM | POA: Diagnosis not present

## 2021-06-03 DIAGNOSIS — Z6825 Body mass index (BMI) 25.0-25.9, adult: Secondary | ICD-10-CM | POA: Diagnosis not present

## 2021-06-03 DIAGNOSIS — R809 Proteinuria, unspecified: Secondary | ICD-10-CM | POA: Diagnosis not present

## 2021-06-22 DIAGNOSIS — R5383 Other fatigue: Secondary | ICD-10-CM | POA: Diagnosis not present

## 2021-06-23 DIAGNOSIS — R059 Cough, unspecified: Secondary | ICD-10-CM | POA: Diagnosis not present

## 2021-06-23 DIAGNOSIS — R051 Acute cough: Secondary | ICD-10-CM | POA: Diagnosis not present

## 2021-06-23 DIAGNOSIS — Z20822 Contact with and (suspected) exposure to covid-19: Secondary | ICD-10-CM | POA: Diagnosis not present

## 2021-07-04 DIAGNOSIS — Z23 Encounter for immunization: Secondary | ICD-10-CM | POA: Diagnosis not present

## 2021-07-08 DIAGNOSIS — M549 Dorsalgia, unspecified: Secondary | ICD-10-CM | POA: Diagnosis not present

## 2021-07-08 DIAGNOSIS — E1165 Type 2 diabetes mellitus with hyperglycemia: Secondary | ICD-10-CM | POA: Diagnosis not present

## 2021-07-08 DIAGNOSIS — I10 Essential (primary) hypertension: Secondary | ICD-10-CM | POA: Diagnosis not present

## 2021-07-08 DIAGNOSIS — I700 Atherosclerosis of aorta: Secondary | ICD-10-CM | POA: Diagnosis not present

## 2021-07-08 DIAGNOSIS — Z299 Encounter for prophylactic measures, unspecified: Secondary | ICD-10-CM | POA: Diagnosis not present

## 2021-09-07 DIAGNOSIS — Z6825 Body mass index (BMI) 25.0-25.9, adult: Secondary | ICD-10-CM | POA: Diagnosis not present

## 2021-09-07 DIAGNOSIS — E1165 Type 2 diabetes mellitus with hyperglycemia: Secondary | ICD-10-CM | POA: Diagnosis not present

## 2021-09-07 DIAGNOSIS — Z0000 Encounter for general adult medical examination without abnormal findings: Secondary | ICD-10-CM | POA: Diagnosis not present

## 2021-09-07 DIAGNOSIS — Z79899 Other long term (current) drug therapy: Secondary | ICD-10-CM | POA: Diagnosis not present

## 2021-09-07 DIAGNOSIS — E7800 Pure hypercholesterolemia, unspecified: Secondary | ICD-10-CM | POA: Diagnosis not present

## 2021-09-07 DIAGNOSIS — R5383 Other fatigue: Secondary | ICD-10-CM | POA: Diagnosis not present

## 2021-09-07 DIAGNOSIS — Z1331 Encounter for screening for depression: Secondary | ICD-10-CM | POA: Diagnosis not present

## 2021-09-07 DIAGNOSIS — I10 Essential (primary) hypertension: Secondary | ICD-10-CM | POA: Diagnosis not present

## 2021-09-07 DIAGNOSIS — Z125 Encounter for screening for malignant neoplasm of prostate: Secondary | ICD-10-CM | POA: Diagnosis not present

## 2021-09-07 DIAGNOSIS — Z7189 Other specified counseling: Secondary | ICD-10-CM | POA: Diagnosis not present

## 2021-09-07 DIAGNOSIS — Z299 Encounter for prophylactic measures, unspecified: Secondary | ICD-10-CM | POA: Diagnosis not present

## 2021-09-07 DIAGNOSIS — Z1339 Encounter for screening examination for other mental health and behavioral disorders: Secondary | ICD-10-CM | POA: Diagnosis not present

## 2021-11-11 ENCOUNTER — Telehealth: Payer: Self-pay | Admitting: *Deleted

## 2021-11-11 NOTE — Chronic Care Management (AMB) (Signed)
  Care Coordination  Outreach Note  11/11/2021 Name: [name] MRN: [id number] DOB: 1935-04-22   Care Coordination Outreach Attempts: An unsuccessful telephone outreach was attempted today to offer the patient information about available care coordination services as a benefit of their health plan.   Follow Up Plan:  Additional outreach attempts will be made to offer the patient care coordination information and services.   Encounter Outcome:  No Answer  [place]  Direct Dial: [phone]

## 2021-11-16 NOTE — Chronic Care Management (AMB) (Signed)
  Care Coordination  Outreach Note  11/16/2021 Name: Benjamin Yang MRN: [id number] DOB: Apr 29, 1935   Care Coordination Outreach Attempts: A second unsuccessful outreach was attempted today to offer the patient with information about available care coordination services as a benefit of their health plan.     Follow Up Plan:  Additional outreach attempts will be made to offer the patient care coordination information and services.   Encounter Outcome:  No Answer  [place]  Direct Dial: [phone]

## 2021-11-19 DIAGNOSIS — Z23 Encounter for immunization: Secondary | ICD-10-CM | POA: Diagnosis not present

## 2021-11-23 NOTE — Chronic Care Management (AMB) (Unsigned)
  Care Coordination  Outreach Note  11/23/2021 Name: Benjamin Yang MRN: [id number] DOB: 05/16/1935   Care Coordination Outreach Attempts: An unsuccessful telephone outreach was attempted today to offer the patient information about available care coordination services as a benefit of their health plan.   Follow Up Plan:  Additional outreach attempts will be made to offer the patient care coordination information and services.   Encounter Outcome:  Pt. Request to Call [place]: [phone]

## 2021-11-25 NOTE — Chronic Care Management (AMB) (Signed)
  Care Coordination   Note   11/25/2021 Name: Benjamin Yang MRN: [id number] DOB: 1935-06-11  Benjamin Yang is a 86 y.o. year old male who sees Monico Blitz, MD for primary care. I reached out to Corrinne Eagle by phone today to offer care coordination services.  [name] was given information about Care Coordination services today including:   The Care Coordination services include support from the care team which includes your Nurse Coordinator, Clinical Social Worker, or Pharmacist.  The Care Coordination team is here to help remove barriers to the health concerns and goals most important to you. Care Coordination services are voluntary, and the patient may decline or stop services at any time by request to their care team member.   Care Coordination Consent Status: Patient spouse Benjamin Yang agreed to services and verbal consent obtained.   Follow up plan:  Telephone appointment with care coordination team member scheduled for:  12/09/21  Encounter Outcome:  Pt. Scheduled  [place]  Direct Dial: [phone]

## 2021-12-09 ENCOUNTER — Ambulatory Visit: Payer: Self-pay | Admitting: *Deleted

## 2021-12-09 NOTE — Patient Outreach (Signed)
  Care Coordination   Initial Visit Note   12/11/2021 Name: Benjamin Yang MRN: [id number] DOB: [birth year]  Benjamin Yang is a 86 y.o. year old male who sees Benjamin Blitz, MD for primary care. I spoke with  Benjamin Yang by phone today.  What matters to the patients health and wellness today?  Report having issues with dizziness lately, issues with balance.  Has history of vertigo.  State he is also having problems with sleeping due to anxiety.  Agrees to speak with CSW but would also like to have a face to Higher education careers adviser.  Denies any urgent concerns, encouraged to contact this care manager with questions.      Goals Addressed             This Visit's Progress    Balance issues, fall risk       Care Coordination Interventions: Evaluation of current treatment plan related to Balance issues, possibly vertigo and patient's adherence to plan as established by provider Advised patient to discuss outpatient PT for vertigo Provided education to patient re: vertigo Reviewed medications with patient and discussed affordability Reviewed scheduled/upcoming provider appointments including PCP on 10/16 Social Work referral for anxiety, telephone visit scheduled for 10/13 Discussed plans with patient for ongoing care management follow up and provided patient with direct contact information for care management team Assessed social determinant of health barriers Call placed to PCP office, request made to discuss balance and dizziness issues as it presents fall risk         SDOH assessments and interventions completed:  Yes  SDOH Interventions Today    Flowsheet Row Most Recent Value  SDOH Interventions   Food Insecurity Interventions Intervention Not Indicated  Housing Interventions Intervention Not Indicated  Transportation Interventions Intervention Not Indicated  Utilities Interventions Intervention Not Indicated        Care Coordination Interventions Activated:  Yes  Care  Coordination Interventions:  Yes, provided   Follow up plan: Follow up call scheduled for 11/8    Encounter Outcome:  Pt. Visit Completed   Benjamin David, RN, MSN, [place] Care Management Care Management Coordinator [phone]

## 2021-12-11 ENCOUNTER — Encounter: Payer: Self-pay | Admitting: *Deleted

## 2021-12-11 ENCOUNTER — Ambulatory Visit: Payer: Self-pay | Admitting: *Deleted

## 2021-12-11 NOTE — Patient Instructions (Signed)
Visit Information  Thank you for taking time to visit with me today. Please don't hesitate to contact me if I can be of assistance to you before our next scheduled telephone appointment.  Following are the goals we discussed today:  Discuss outpatient therapy for vertigo   Our next appointment is by telephone on 11/8  Please call the care guide team at [phone] if you need to cancel or reschedule your appointment.   Please call the Suicide and Crisis Lifeline: 988 call the [place] National Suicide Prevention Lifeline: 403-051-6983 or TTY: (802)148-3037 TTY (417) 871-2722) to talk to a trained counselor call 1-800-273-TALK (toll free, 24 hour hotline) call the [facility]: (630)716-7858 call 911 if you are experiencing a Mental Health or [place] or need someone to talk to.  Patient verbalizes understanding of instructions and care plan provided today and agrees to view in [place]. Active MyChart status and patient understanding of how to access instructions and care plan via MyChart confirmed with patient.     The patient has been provided with contact information for the care management team and has been advised to call with any health related questions or concerns.   Valente David, RN, MSN, [place] Care Management Care Management Coordinator [phone]

## 2021-12-12 ENCOUNTER — Encounter: Payer: Self-pay | Admitting: *Deleted

## 2021-12-12 NOTE — Patient Instructions (Signed)
Visit Information  Thank you for taking time to visit with me today. Please don't hesitate to contact me if I can be of assistance to you.   Following are the goals we discussed today:   Goals Addressed               This Visit's Progress     Reduce and Manage Symptoms of Anxiety and Sleeplessness. (pt-stated)   On track     Care Coordination Interventions:  Assessed Social Determinant of Health Barriers. Discussed Plans with Patient for Ongoing Care Management Follow Up. Provided Patient with Direct Contact Information for Care Management Team. Screened Patient for Signs & Symptoms of Depression Related to Chronic Disease State.  YFV4/BSW9 Depression Screen Completed & Results Reviewed with Patient.  Suicidal Ideation/Homicidal Ideation Assessed - None Present. Solution-Focused Strategies Employed. Deep Breathing Exercises, Relaxation Techniques & Mindfulness Meditation Strategies Taught & Encouraged Daily.   Active Listening/Reflection Utilized.  Emotional Support Provided. Verbalization of Feelings Encouraged.  Caregiver Stress Acknowledged. Problem Solving/Task-Centered Solutions Developed.   Psychoeducation for Mental Health Concerns Initiated. Reviewed Mental Health Medications & Discussed Importance of Compliance. Quality of Sleep Assessed & Sleep Hygiene Techniques Promoted. Participation in Counseling Encouraged. Discussed Referral to Psychiatrist for Psychotropic Medication Management. Discussed Referral to Therapist for Psychotherapeutic Counseling Services.          Our next appointment is by telephone on 12/25/2021 at 9:15 am.  Please call the care guide team at [phone] if you need to cancel or reschedule your appointment.   If you are experiencing a Mental Health or [place] or need someone to talk to, please call the Suicide and Crisis Lifeline: 988 call the [place] National Suicide Prevention Lifeline: 561-426-7083 or TTY: 718-481-9977  TTY 8657482011) to talk to a trained counselor call 1-800-273-TALK (toll free, 24 hour hotline) go to [facility] [address] [place] [phone]) call the [place]: [phone] call 911  Patient verbalizes understanding of instructions and care plan provided today and agrees to view in [place]. Active MyChart status and patient understanding of how to access instructions and care plan via MyChart confirmed with patient.     Telephone follow up appointment with care management team member scheduled for:  12/25/2021 at 9:15 am.  Nat Christen, BSW, MSW, [place]  Licensed Clinical Social Worker  [place]  Mailing [place]. [address] [place], [place] [address] Physical Address-[address], [place], [place] [address] Toll Free Main # [phone] Fax # [phone] Cell # [phone] Di [email or web address]

## 2021-12-12 NOTE — Patient Outreach (Signed)
  Care Coordination   Initial Visit Note   12/12/2021  Name: GIUSEPPE DUCHEMIN MRN: [id number] DOB: January 27, 1936  SARAH ZERBY is a 86 y.o. year old male who sees Monico Blitz, MD for primary care. I spoke with Corrinne Eagle by phone today.  What matters to the patients health and wellness today?  Reduce and Manage Symptoms of Anxiety and Sleeplessness.    Goals Addressed               This Visit's Progress     Reduce and Manage Symptoms of Anxiety and Sleeplessness. (pt-stated)   On track     Care Coordination Interventions:  Assessed Social Determinant of Health Barriers. Discussed Plans with Patient for Ongoing Care Management Follow Up. Provided Patient with Direct Contact Information for Care Management Team. Screened Patient for Signs & Symptoms of Depression Related to Chronic Disease State.  KZL9/JTT0 Depression Screen Completed & Results Reviewed with Patient.  Suicidal Ideation/Homicidal Ideation Assessed - None Present. Solution-Focused Strategies Employed. Deep Breathing Exercises, Relaxation Techniques & Mindfulness Meditation Strategies Taught & Encouraged Daily.   Active Listening/Reflection Utilized.  Emotional Support Provided. Verbalization of Feelings Encouraged.  Caregiver Stress Acknowledged. Problem Solving/Task-Centered Solutions Developed.   Psychoeducation for Mental Health Concerns Initiated. Reviewed Mental Health Medications & Discussed Importance of Compliance. Quality of Sleep Assessed & Sleep Hygiene Techniques Promoted. Participation in Counseling Encouraged. Discussed Referral to Psychiatrist for Psychotropic Medication Management. Discussed Referral to Therapist for Psychotherapeutic Counseling Services.          SDOH assessments and interventions completed:  Yes.  SDOH Interventions Today    Flowsheet Row Most Recent Value  SDOH Interventions   Food Insecurity Interventions Intervention Not Indicated  Housing Interventions  Intervention Not Indicated  Transportation Interventions Intervention Not Indicated  Utilities Interventions Intervention Not Indicated  Alcohol Usage Interventions Intervention Not Indicated (Score <7)  Financial Strain Interventions Intervention Not Indicated  Physical Activity Interventions Patient Refused  Stress Interventions Offered [employer], Provide Counseling, Other (Comment)  [Provided Counseling Agencies and Resources]  Social Connections Interventions Intervention Not Indicated        Care Coordination Interventions Activated:  Yes.   Care Coordination Interventions:  Yes, provided.   Follow up plan: Follow up call scheduled for 12/25/2021 at 9:15 am.  Encounter Outcome:  Pt. Visit Completed.   Nat Christen, BSW, MSW, LCSW  Licensed Education officer, environmental Health System  Mailing [place] [address], [place], [place] [address] Physical Address-[address], [place], [place] [address] Toll Free Main # [phone] Fax # [phone] Cell # [phone] Di [email or web address]

## 2021-12-14 DIAGNOSIS — I10 Essential (primary) hypertension: Secondary | ICD-10-CM | POA: Diagnosis not present

## 2021-12-14 DIAGNOSIS — Z6825 Body mass index (BMI) 25.0-25.9, adult: Secondary | ICD-10-CM | POA: Diagnosis not present

## 2021-12-14 DIAGNOSIS — R42 Dizziness and giddiness: Secondary | ICD-10-CM | POA: Diagnosis not present

## 2021-12-14 DIAGNOSIS — Z299 Encounter for prophylactic measures, unspecified: Secondary | ICD-10-CM | POA: Diagnosis not present

## 2021-12-14 DIAGNOSIS — E1165 Type 2 diabetes mellitus with hyperglycemia: Secondary | ICD-10-CM | POA: Diagnosis not present

## 2021-12-14 DIAGNOSIS — F419 Anxiety disorder, unspecified: Secondary | ICD-10-CM | POA: Diagnosis not present

## 2021-12-21 DIAGNOSIS — R42 Dizziness and giddiness: Secondary | ICD-10-CM | POA: Diagnosis not present

## 2021-12-21 DIAGNOSIS — Z299 Encounter for prophylactic measures, unspecified: Secondary | ICD-10-CM | POA: Diagnosis not present

## 2021-12-21 DIAGNOSIS — Z713 Dietary counseling and surveillance: Secondary | ICD-10-CM | POA: Diagnosis not present

## 2021-12-21 DIAGNOSIS — I10 Essential (primary) hypertension: Secondary | ICD-10-CM | POA: Diagnosis not present

## 2021-12-21 DIAGNOSIS — Z6825 Body mass index (BMI) 25.0-25.9, adult: Secondary | ICD-10-CM | POA: Diagnosis not present

## 2021-12-25 ENCOUNTER — Encounter: Payer: Self-pay | Admitting: *Deleted

## 2021-12-25 ENCOUNTER — Ambulatory Visit: Payer: Self-pay | Admitting: *Deleted

## 2021-12-25 NOTE — Patient Outreach (Signed)
  Care Coordination   Follow Up Visit Note   12/25/2021  Name: Benjamin Yang MRN: [id number] DOB: May 18, 1935  Benjamin Yang is a 86 y.o. year old male who sees Monico Blitz, MD for primary care. I spoke with patient's wife, Benjamin Yang by phone today.  What matters to the patients health and wellness today?   Reduce and Manage Symptoms of Anxiety and Sleeplessness.   Goals Addressed               This Visit's Progress     COMPLETED: Reduce and Manage Symptoms of Anxiety and Sleeplessness. (pt-stated)   On track     Care Coordination Interventions:  Deep Breathing Exercises, Relaxation Techniques & Mindfulness Meditation Strategies Reviewed & Encouraged Daily.   Active Listening Utilized.  Verbalization of Feelings Encouraged. Emotional Support Provided. Caregiver Stress Acknowledged. Caregiver Support Offered. Caregiver Resources Discussed. Task-Centered Solutions Employed.   Participation in Counseling Emphasized. Please Contact CSW Directly (# [phone]), If You Change Your Mind About Wanting to Receive Counseling and Supportive Services, In An Effort to Reduce and Manage Symptoms of Anxiety and Sleeplessness.        SDOH assessments and interventions completed:  Yes.  Care Coordination Interventions Activated:  Yes.   Care Coordination Interventions:  Yes, provided.   Follow up plan: No further intervention required.   Encounter Outcome:  Pt. Visit Completed.   Nat Christen, BSW, MSW, LCSW  Licensed Education officer, environmental Health System  Mailing [place] [address] [place] [address], [place], [place] [address] Physical Address-[address], [place], [place] [address] Toll Free Main # [phone] Fax # [phone] Cell # [phone] [email or web address]

## 2021-12-25 NOTE — Patient Outreach (Signed)
  Care Coordination   12/25/2021  Name: Benjamin Yang MRN: [id number] DOB: 1936/01/03   Care Coordination Outreach Attempts:  An unsuccessful telephone outreach was attempted today to offer the patient information about available care coordination services as a benefit of their health plan. HIPAA compliant message left on voicemail, providing contact information for CSW, encouraging patient to return CSW's call at his earliest convenience.   Follow Up Plan:  Additional outreach attempts will be made to offer the patient care coordination information and services.   Encounter Outcome:  No Answer.   Care Coordination Interventions Activated:  No.    Care Coordination Interventions:  No, not indicated.    Nat Christen, BSW, MSW, LCSW  Licensed Education officer, environmental Health System  Mailing [place] [address] [place], [place] [address] Physical Address-[address] [place], [place] [address] Toll Free Main # [phone] Fax # [phone] Cell # [phone] Di [email or web address]

## 2021-12-25 NOTE — Patient Instructions (Signed)
Visit Information  Thank you for taking time to visit with me today. Please don't hesitate to contact me if I can be of assistance to you.   Following are the goals we discussed today:   Goals Addressed               This Visit's Progress     COMPLETED: Reduce and Manage Symptoms of Anxiety and Sleeplessness. (pt-stated)   On track     Care Coordination Interventions:  Deep Breathing Exercises, Relaxation Techniques & Mindfulness Meditation Strategies Reviewed & Encouraged Daily.   Active Listening Utilized.  Verbalization of Feelings Encouraged. Emotional Support Provided. Caregiver Stress Acknowledged. Caregiver Support Offered. Caregiver Resources Discussed. Task-Centered Solutions Employed.   Participation in Counseling Emphasized. Please Contact CSW Directly (# [phone]), If You Change Your Mind About Wanting to Receive Counseling and Supportive Services, In An Effort to Reduce and Manage Symptoms of Anxiety and Sleeplessness.        Please call the care guide team at [phone] if you need to cancel or reschedule your appointment.   If you are experiencing a Mental Health or [place] or need someone to talk to, please call the Suicide and Crisis Lifeline: 988 call the [place] National Suicide Prevention Lifeline: 949-179-2238 or TTY: 567-245-7024 TTY (774)380-8013) to talk to a trained counselor call 1-800-273-TALK (toll free, 24 hour hotline) go to [facility] [address], [place] [phone]) call the [place]: [phone] call 911  Patient verbalizes understanding of instructions and care plan provided today and agrees to view in [place]. Active MyChart status and patient understanding of how to access instructions and care plan via MyChart confirmed with patient.     No further follow up required.  Nat Christen, BSW, MSW, LCSW  Licensed [facility]  Mailing [place] [address], [place], [place] [address] Physical Address-[address] [place], [place] [address] Toll Free Main # [phone] Fax # [phone] Cell # [phone] Di [email or web address]

## 2021-12-28 DIAGNOSIS — R42 Dizziness and giddiness: Secondary | ICD-10-CM | POA: Diagnosis not present

## 2022-01-04 DIAGNOSIS — R531 Weakness: Secondary | ICD-10-CM | POA: Diagnosis not present

## 2022-01-04 DIAGNOSIS — G319 Degenerative disease of nervous system, unspecified: Secondary | ICD-10-CM | POA: Diagnosis not present

## 2022-01-04 DIAGNOSIS — R42 Dizziness and giddiness: Secondary | ICD-10-CM | POA: Diagnosis not present

## 2022-01-04 DIAGNOSIS — R200 Anesthesia of skin: Secondary | ICD-10-CM | POA: Diagnosis not present

## 2022-01-06 ENCOUNTER — Ambulatory Visit: Payer: Self-pay | Admitting: *Deleted

## 2022-01-06 ENCOUNTER — Encounter: Payer: Self-pay | Admitting: *Deleted

## 2022-01-06 NOTE — Patient Outreach (Signed)
  Care Coordination   Follow Up Visit Note   01/06/2022 Name: Benjamin Yang MRN: [id number] DOB: 1935/12/04  Benjamin Yang is a 86 y.o. year old male who sees Monico Blitz, MD for primary care. I spoke with  Corrinne Eagle and his wife by phone today.  What matters to the patients health and wellness today?  Finding cause of dizziness and working to improve it. Improve balance and decrease fall risk.    Goals Addressed             This Visit's Progress    Balance issues, fall risk       Care Coordination Interventions: Evaluation of current treatment plan related to dizziness and balance concerns and patient's adherence to plan as established by provider Provided education to patient re: changes in the inner ear that can cause vertigo and dizziness Reviewed scheduled/upcoming provider appointments including ENT on 02/10/22 Discussed plans with patient for ongoing care management follow up and provided patient with direct contact information for care management team Advised patient to discuss results of MRI and vestibular exercises/PT with provider Assessed social determinant of health barriers Assessed fall risk Educated on fall prevention strategies Reviewed recent head CT normal results Provided with [facility] telephone number and encouraged to reach out as needed         SDOH assessments and interventions completed:  Yes  SDOH Interventions Today    Flowsheet Row Most Recent Value  SDOH Interventions   Transportation Interventions Intervention Not Indicated  Financial Strain Interventions Intervention Not Indicated        Care Coordination Interventions Activated:  Yes  Care Coordination Interventions:  Yes, provided   Follow up plan: Follow up call scheduled for 02/17/22    Encounter Outcome:  Pt. Visit Completed   Chong Sicilian, BSN, RN-BC RN Care Coordinator [place]: [phone] Main #: [phone]

## 2022-01-27 DIAGNOSIS — F419 Anxiety disorder, unspecified: Secondary | ICD-10-CM | POA: Diagnosis not present

## 2022-01-27 DIAGNOSIS — J309 Allergic rhinitis, unspecified: Secondary | ICD-10-CM | POA: Diagnosis not present

## 2022-01-27 DIAGNOSIS — Z299 Encounter for prophylactic measures, unspecified: Secondary | ICD-10-CM | POA: Diagnosis not present

## 2022-01-27 DIAGNOSIS — I10 Essential (primary) hypertension: Secondary | ICD-10-CM | POA: Diagnosis not present

## 2022-01-27 DIAGNOSIS — R42 Dizziness and giddiness: Secondary | ICD-10-CM | POA: Diagnosis not present

## 2022-02-10 DIAGNOSIS — H838X3 Other specified diseases of inner ear, bilateral: Secondary | ICD-10-CM | POA: Diagnosis not present

## 2022-02-10 DIAGNOSIS — R42 Dizziness and giddiness: Secondary | ICD-10-CM | POA: Diagnosis not present

## 2022-02-10 DIAGNOSIS — H903 Sensorineural hearing loss, bilateral: Secondary | ICD-10-CM | POA: Diagnosis not present

## 2022-02-10 DIAGNOSIS — H6123 Impacted cerumen, bilateral: Secondary | ICD-10-CM | POA: Diagnosis not present

## 2022-02-17 ENCOUNTER — Ambulatory Visit: Payer: Self-pay | Admitting: *Deleted

## 2022-02-17 NOTE — Patient Outreach (Signed)
  Care Coordination   Follow Up Visit Note   02/17/2022 Name: Benjamin Yang MRN: [id number] DOB: April 12, 1935  Benjamin Yang is a 86 y.o. year old male who sees Monico Blitz, MD for primary care. I spoke with  Corrinne Eagle by phone today.  What matters to the patients health and wellness today?  Balance and fall prevention    Goals Addressed             This Visit's Progress    Balance issues, fall risk       Care Coordination Interventions: Evaluation of current treatment plan related to dizziness and balance concerns and patient's adherence to plan as established by provider Discussed plans with patient for ongoing care management follow up and provided patient with direct contact information for care management team Assessed social determinant of health barriers Fall Assessment performed. No falls since last telephone visit. He moves carefully to avoid falls Reviewed recent imaging studies and discussed visit with ENT and PCP Discussed upcoming appointment for additional testing on his middle ear on 02/23/22 Assessed for additional Care Coordination or Resource needs. Denies any at this time.  Encouraged to reach out to [facility] as needed         SDOH assessments and interventions completed:  Yes  SDOH Interventions Today    Flowsheet Row Most Recent Value  SDOH Interventions   Housing Interventions Intervention Not Indicated  Transportation Interventions Intervention Not Indicated        Care Coordination Interventions:  Yes, provided   Follow up plan: Follow up call scheduled for 03/19/22    Encounter Outcome:  Pt. Visit Completed   Chong Sicilian, BSN, RN-BC RN Care Coordinator [place]: [phone] Main #: [phone]

## 2022-02-23 DIAGNOSIS — R42 Dizziness and giddiness: Secondary | ICD-10-CM | POA: Diagnosis not present

## 2022-03-03 DIAGNOSIS — H903 Sensorineural hearing loss, bilateral: Secondary | ICD-10-CM | POA: Diagnosis not present

## 2022-03-03 DIAGNOSIS — R42 Dizziness and giddiness: Secondary | ICD-10-CM | POA: Diagnosis not present

## 2022-03-10 DIAGNOSIS — Z789 Other specified health status: Secondary | ICD-10-CM | POA: Diagnosis not present

## 2022-03-10 DIAGNOSIS — I700 Atherosclerosis of aorta: Secondary | ICD-10-CM | POA: Diagnosis not present

## 2022-03-10 DIAGNOSIS — R42 Dizziness and giddiness: Secondary | ICD-10-CM | POA: Diagnosis not present

## 2022-03-10 DIAGNOSIS — Z299 Encounter for prophylactic measures, unspecified: Secondary | ICD-10-CM | POA: Diagnosis not present

## 2022-03-10 DIAGNOSIS — Z6825 Body mass index (BMI) 25.0-25.9, adult: Secondary | ICD-10-CM | POA: Diagnosis not present

## 2022-03-10 DIAGNOSIS — I10 Essential (primary) hypertension: Secondary | ICD-10-CM | POA: Diagnosis not present

## 2022-03-17 ENCOUNTER — Ambulatory Visit (HOSPITAL_COMMUNITY): Payer: Medicare Other | Admitting: Clinical

## 2022-03-19 ENCOUNTER — Ambulatory Visit: Payer: Self-pay | Admitting: *Deleted

## 2022-03-19 NOTE — Patient Outreach (Signed)
  Care Coordination   Follow Up Visit Note   03/19/2022 Name: Benjamin Yang MRN: [id number] DOB: Sep 26, 1935  Benjamin Yang is a 87 y.o. year old male who sees Monico Blitz, MD for primary care. I  spoke with wife, Benjamin Yang, by telephone today.  What matters to the patients health and wellness today?  Ongoing self health management. Patient will follow-up with [facility] and RN through [facility].     Goals Addressed             This Visit's Progress    COMPLETED: Balance issues, fall risk       Care Coordination Interventions: Evaluation of current treatment plan related to dizziness and balance concerns and patient's adherence to plan as established by provider Discussed plans with patient for ongoing care management follow up and provided patient with direct contact information for care management team Assessed social determinant of health barriers Fall Assessment performed. No falls since last telephone visit. He moves carefully to avoid falls Reviewed recent imaging studies and discussed visit with ENT and PCP Discussed upcoming appointment for additional testing on his middle ear on 02/23/22 Assessed for additional Care Coordination or Resource needs. Denies any at this time.  Encouraged to reach out to [facility] as needed  Closing out Care Coordination services. Patient is working with [facility] and Nurse through [facility].          SDOH assessments and interventions completed:  No     Care Coordination Interventions:  No, not indicated   Follow up plan: No further intervention required.   Encounter Outcome:  Pt. Visit Completed   Chong Sicilian, BSN, RN-BC RN Care Coordinator [place] Direct Dial: [phone] Main #: [phone]

## 2022-03-20 ENCOUNTER — Inpatient Hospital Stay (HOSPITAL_COMMUNITY)
Admission: EM | Admit: 2022-03-20 | Discharge: 2022-03-23 | DRG: 690 | Disposition: A | Discharge status: Home / Self Care | Payer: Medicare Other | Attending: Internal Medicine | Admitting: Internal Medicine

## 2022-03-20 ENCOUNTER — Emergency Department (HOSPITAL_COMMUNITY): Payer: Medicare Other

## 2022-03-20 ENCOUNTER — Other Ambulatory Visit: Payer: Self-pay

## 2022-03-20 ENCOUNTER — Encounter (HOSPITAL_COMMUNITY): Payer: Self-pay

## 2022-03-20 DIAGNOSIS — F419 Anxiety disorder, unspecified: Secondary | ICD-10-CM | POA: Diagnosis not present

## 2022-03-20 DIAGNOSIS — Z79899 Other long term (current) drug therapy: Secondary | ICD-10-CM | POA: Diagnosis not present

## 2022-03-20 DIAGNOSIS — Z8551 Personal history of malignant neoplasm of bladder: Secondary | ICD-10-CM

## 2022-03-20 DIAGNOSIS — R739 Hyperglycemia, unspecified: Secondary | ICD-10-CM | POA: Diagnosis not present

## 2022-03-20 DIAGNOSIS — N136 Pyonephrosis: Principal | ICD-10-CM | POA: Diagnosis present

## 2022-03-20 DIAGNOSIS — I959 Hypotension, unspecified: Secondary | ICD-10-CM | POA: Diagnosis not present

## 2022-03-20 DIAGNOSIS — N132 Hydronephrosis with renal and ureteral calculous obstruction: Principal | ICD-10-CM | POA: Diagnosis present

## 2022-03-20 DIAGNOSIS — E871 Hypo-osmolality and hyponatremia: Secondary | ICD-10-CM | POA: Diagnosis present

## 2022-03-20 DIAGNOSIS — R54 Age-related physical debility: Secondary | ICD-10-CM | POA: Diagnosis present

## 2022-03-20 DIAGNOSIS — N12 Tubulo-interstitial nephritis, not specified as acute or chronic: Secondary | ICD-10-CM

## 2022-03-20 DIAGNOSIS — R1012 Left upper quadrant pain: Secondary | ICD-10-CM | POA: Diagnosis not present

## 2022-03-20 DIAGNOSIS — N281 Cyst of kidney, acquired: Secondary | ICD-10-CM | POA: Diagnosis not present

## 2022-03-20 DIAGNOSIS — K5730 Diverticulosis of large intestine without perforation or abscess without bleeding: Secondary | ICD-10-CM | POA: Diagnosis not present

## 2022-03-20 DIAGNOSIS — N179 Acute kidney failure, unspecified: Secondary | ICD-10-CM | POA: Diagnosis not present

## 2022-03-20 DIAGNOSIS — Z1152 Encounter for screening for COVID-19: Secondary | ICD-10-CM | POA: Diagnosis not present

## 2022-03-20 DIAGNOSIS — N390 Urinary tract infection, site not specified: Secondary | ICD-10-CM | POA: Diagnosis present

## 2022-03-20 DIAGNOSIS — I10 Essential (primary) hypertension: Secondary | ICD-10-CM | POA: Diagnosis present

## 2022-03-20 DIAGNOSIS — Z66 Do not resuscitate: Secondary | ICD-10-CM | POA: Diagnosis present

## 2022-03-20 DIAGNOSIS — R7303 Prediabetes: Secondary | ICD-10-CM | POA: Diagnosis present

## 2022-03-20 DIAGNOSIS — N201 Calculus of ureter: Secondary | ICD-10-CM | POA: Diagnosis not present

## 2022-03-20 DIAGNOSIS — Z885 Allergy status to narcotic agent status: Secondary | ICD-10-CM | POA: Diagnosis not present

## 2022-03-20 DIAGNOSIS — M549 Dorsalgia, unspecified: Secondary | ICD-10-CM | POA: Diagnosis not present

## 2022-03-20 DIAGNOSIS — N1330 Unspecified hydronephrosis: Secondary | ICD-10-CM | POA: Diagnosis not present

## 2022-03-20 LAB — CBC WITH DIFFERENTIAL/PLATELET
Abs Immature Granulocytes: 0.06 10*3/uL (ref 0.00–0.07)
Basophils Absolute: 0 10*3/uL (ref 0.0–0.1)
Basophils Relative: 0 %
Eosinophils Absolute: 0 10*3/uL (ref 0.0–0.5)
Eosinophils Relative: 0 %
HCT: 44.4 % (ref 39.0–52.0)
Hemoglobin: 14.8 g/dL (ref 13.0–17.0)
Immature Granulocytes: 0 %
Lymphocytes Relative: 1 %
Lymphs Abs: 0.2 10*3/uL — ABNORMAL LOW (ref 0.7–4.0)
MCH: 32 pg (ref 26.0–34.0)
MCHC: 33.3 g/dL (ref 30.0–36.0)
MCV: 95.9 fL (ref 80.0–100.0)
Monocytes Absolute: 1.4 10*3/uL — ABNORMAL HIGH (ref 0.1–1.0)
Monocytes Relative: 9 %
Neutro Abs: 14.3 10*3/uL — ABNORMAL HIGH (ref 1.7–7.7)
Neutrophils Relative %: 90 %
Platelets: 186 10*3/uL (ref 150–400)
RBC: 4.63 MIL/uL (ref 4.22–5.81)
RDW: 12.6 % (ref 11.5–15.5)
WBC: 16 10*3/uL — ABNORMAL HIGH (ref 4.0–10.5)
nRBC: 0 % (ref 0.0–0.2)

## 2022-03-20 LAB — COMPREHENSIVE METABOLIC PANEL
ALT: 21 U/L (ref 0–44)
AST: 22 U/L (ref 15–41)
Albumin: 3.6 g/dL (ref 3.5–5.0)
Alkaline Phosphatase: 60 U/L (ref 38–126)
Anion gap: 10 (ref 5–15)
BUN: 17 mg/dL (ref 8–23)
CO2: 21 mmol/L — ABNORMAL LOW (ref 22–32)
Calcium: 8.5 mg/dL — ABNORMAL LOW (ref 8.9–10.3)
Chloride: 102 mmol/L (ref 98–111)
Creatinine, Ser: 1.13 mg/dL (ref 0.61–1.24)
GFR, Estimated: >60 mL/min (ref >60)
Glucose, Bld: 198 mg/dL — ABNORMAL HIGH (ref 70–99)
Potassium: 4.2 mmol/L (ref 3.5–5.1)
Sodium: 133 mmol/L — ABNORMAL LOW (ref 135–145)
Total Bilirubin: 1 mg/dL (ref 0.3–1.2)
Total Protein: 6.7 g/dL (ref 6.5–8.1)

## 2022-03-20 LAB — LIPASE, BLOOD: Lipase: 25 U/L (ref 11–51)

## 2022-03-20 LAB — URINALYSIS, ROUTINE W REFLEX MICROSCOPIC
Bacteria, UA: NONE SEEN
Bilirubin Urine: NEGATIVE
Glucose, UA: NEGATIVE mg/dL
Ketones, ur: 5 mg/dL — AB
Nitrite: POSITIVE — AB
Protein, ur: 100 mg/dL — AB
Specific Gravity, Urine: 1.017 (ref 1.005–1.030)
WBC, UA: >50 WBC/hpf — ABNORMAL HIGH (ref 0–5)
pH: 7 (ref 5.0–8.0)

## 2022-03-20 LAB — RESP PANEL BY RT-PCR (RSV, FLU A&B, COVID)  RVPGX2
Influenza A by PCR: NEGATIVE
Influenza B by PCR: NEGATIVE
Resp Syncytial Virus by PCR: NEGATIVE
SARS Coronavirus 2 by RT PCR: NEGATIVE

## 2022-03-20 MED ORDER — ONDANSETRON HCL 4 MG/2ML IJ SOLN: 4.0000 mg | Freq: Four times a day (QID) | INTRAMUSCULAR | Status: DC | PRN

## 2022-03-20 MED ORDER — SODIUM CHLORIDE 0.9 % IV BOLUS
1000.0000 mL | Freq: Once | INTRAVENOUS | Status: AC
  Administered 2022-03-20: 1000 mL via INTRAVENOUS

## 2022-03-20 MED ORDER — IRBESARTAN 150 MG PO TABS: 300.0000 mg | ORAL_TABLET | Freq: Every day | ORAL | Status: DC

## 2022-03-20 MED ORDER — KETOROLAC TROMETHAMINE 15 MG/ML IJ SOLN
15.0000 mg | Freq: Four times a day (QID) | INTRAMUSCULAR | Status: DC | PRN
  Administered 2022-03-21 – 2022-03-22 (×2): 15 mg via INTRAVENOUS
  Filled 2022-03-20 (×2): qty 1

## 2022-03-20 MED ORDER — ACETAMINOPHEN 650 MG RE SUPP: 650.0000 mg | Freq: Four times a day (QID) | RECTAL | Status: DC | PRN

## 2022-03-20 MED ORDER — IOHEXOL 300 MG/ML  SOLN
100.0000 mL | Freq: Once | INTRAMUSCULAR | Status: AC | PRN
  Administered 2022-03-20: 100 mL via INTRAVENOUS

## 2022-03-20 MED ORDER — LORAZEPAM 1 MG PO TABS: 1.0000 mg | ORAL_TABLET | Freq: Two times a day (BID) | ORAL | Status: DC | PRN

## 2022-03-20 MED ORDER — SODIUM CHLORIDE 0.9 % IV SOLN
1.0000 g | Freq: Once | INTRAVENOUS | Status: AC
  Administered 2022-03-20: 1 g via INTRAVENOUS
  Filled 2022-03-20: qty 10

## 2022-03-20 MED ORDER — ONDANSETRON HCL 4 MG/2ML IJ SOLN
4.0000 mg | Freq: Once | INTRAMUSCULAR | Status: AC
  Administered 2022-03-20: 4 mg via INTRAVENOUS
  Filled 2022-03-20: qty 2

## 2022-03-20 MED ORDER — OXYCODONE HCL 5 MG PO TABS: 5.0000 mg | ORAL_TABLET | ORAL | Status: DC | PRN

## 2022-03-20 MED ORDER — ONDANSETRON HCL 4 MG PO TABS: 4.0000 mg | ORAL_TABLET | Freq: Four times a day (QID) | ORAL | Status: DC | PRN

## 2022-03-20 MED ORDER — HEPARIN SODIUM (PORCINE) 5000 UNIT/ML IJ SOLN
5000.0000 [IU] | Freq: Three times a day (TID) | INTRAMUSCULAR | Status: DC
  Administered 2022-03-21 – 2022-03-23 (×6): 5000 [IU] via SUBCUTANEOUS
  Filled 2022-03-20 (×7): qty 1

## 2022-03-20 MED ORDER — ACETAMINOPHEN 325 MG PO TABS
650.0000 mg | ORAL_TABLET | Freq: Four times a day (QID) | ORAL | Status: DC | PRN
  Administered 2022-03-21 – 2022-03-23 (×3): 650 mg via ORAL
  Filled 2022-03-20 (×3): qty 2

## 2022-03-20 NOTE — ED Triage Notes (Signed)
Pt reports intermittent LLQ pain with nausea for several months, has not sought medical treatment until today.  EMS reports pt took some of wife's hydrocodone.

## 2022-03-20 NOTE — ED Provider Notes (Signed)
[facility] Provider Note   CSN: [id number] Arrival date & time: 03/20/22  1759     History  Chief Complaint  Patient presents with   Abdominal Pain    Benjamin Yang is a 87 y.o. male with history significant for s/p cystoprostatectomy, hypertension, and bladder cancer presents to the ED by EMS complaining of intermittent LLQ pain with nausea that began on Friday around midnight.  EMS reports patient took some of wife's hydrocodone without relief of symptoms.  Patient states he has also had some diarrhea, but no vomiting.  Patient's wife at bedside reports patient has been weaker than his baseline.  Denies fever, chills, dizziness, lightheadedness, syncope, abdominal distension.        Home Medications Prior to Admission medications   Medication Sig Start Date End Date Taking? Authorizing Provider  HYDROcodone-acetaminophen (NORCO/VICODIN) 5-325 MG tablet Take 1 tablet by mouth every 6 (six) hours as needed for moderate pain. 12/30/20   Fredia Sorrow, MD  ibuprofen (ADVIL) 200 MG tablet Take 600 mg by mouth every 6 (six) hours as needed.    [provider]  LORazepam (ATIVAN) 1 MG tablet Take 1 tablet by mouth 2 (two) times daily as needed. 03/18/17   [provider]  ondansetron (ZOFRAN ODT) 4 MG disintegrating tablet '4mg'$  ODT q4 hours prn nausea/vomit 03/18/17   Milton Ferguson, MD  telmisartan (MICARDIS) 80 MG tablet Take 80 mg by mouth daily.    [provider]      Allergies    Percocet [oxycodone-acetaminophen]    Review of Systems   Review of Systems  Constitutional:  Negative for chills and fever.  Gastrointestinal:  Positive for abdominal pain, diarrhea and nausea. Negative for abdominal distention and vomiting.  Neurological:  Positive for weakness (generalized). Negative for dizziness, syncope and light-headedness.    Physical Exam Updated Vital Signs BP 123/62   Pulse 94   Temp 98.3 F  (36.8 C) (Oral)   Resp 20   Ht '6\' 2"'$  (1.88 m)   Wt 88.5 kg   SpO2 93%   BMI 25.05 kg/m  Physical Exam Vitals and nursing note reviewed.  Constitutional:      General: He is not in acute distress.    Appearance: He is not ill-appearing.  HENT:     Mouth/Throat:     Mouth: Mucous membranes are moist.     Pharynx: Oropharynx is clear.  Cardiovascular:     Rate and Rhythm: Normal rate and regular rhythm.     Pulses: Normal pulses.     Heart sounds: Normal heart sounds.  Pulmonary:     Effort: Pulmonary effort is normal. No respiratory distress.     Breath sounds: Normal breath sounds and air entry.  Abdominal:     General: Abdomen is flat. Bowel sounds are normal. There is no distension.     Palpations: Abdomen is soft.     Tenderness: There is abdominal tenderness in the left upper quadrant and left lower quadrant.     Comments: Mild tenderness to palpation of left abdominal quadrants.    Skin:    General: Skin is warm and dry.     Capillary Refill: Capillary refill takes less than 2 seconds.  Neurological:     Mental Status: He is alert. Mental status is at baseline.  Psychiatric:        Mood and Affect: Mood normal.        Behavior: Behavior normal.  ED Results / Procedures / Treatments   Labs (all labs ordered are listed, but only abnormal results are displayed) Labs Reviewed  COMPREHENSIVE METABOLIC PANEL - Abnormal; Notable for the following components:      Result Value   Sodium 133 (*)    CO2 21 (*)    Glucose, Bld 198 (*)    Calcium 8.5 (*)    All other components within normal limits  CBC WITH DIFFERENTIAL/PLATELET - Abnormal; Notable for the following components:   WBC 16.0 (*)    Neutro Abs 14.3 (*)    Lymphs Abs 0.2 (*)    Monocytes Absolute 1.4 (*)    All other components within normal limits  URINALYSIS, ROUTINE W REFLEX MICROSCOPIC - Abnormal; Notable for the following components:   APPearance CLOUDY (*)    Hgb urine dipstick SMALL (*)     Ketones, ur 5 (*)    Protein, ur 100 (*)    Nitrite POSITIVE (*)    Leukocytes,Ua LARGE (*)    WBC, UA >50 (*)    All other components within normal limits  RESP PANEL BY RT-PCR (RSV, FLU A&B, COVID)  [id number]  URINE CULTURE  LIPASE, BLOOD  CBC WITH DIFFERENTIAL/PLATELET    EKG EKG Interpretation  Date/Time:  Saturday March 20 2022 19:01:36 EST Ventricular Rate:  98 PR Interval:  190 QRS Duration: 85 QT Interval:  328 QTC Calculation: 419 R Axis:   -9 Text Interpretation: Sinus rhythm Low voltage, precordial leads Borderline T abnormalities, lateral leads Confirmed by Godfrey Pick ([id number]) on 03/20/2022 9:14:10 PM  Radiology CT Abdomen Pelvis W Contrast  Result Date: 03/20/2022 CLINICAL DATA:  Left lower quadrant abdominal pain EXAM: CT ABDOMEN AND PELVIS WITH CONTRAST TECHNIQUE: Multidetector CT imaging of the abdomen and pelvis was performed using the standard protocol following bolus administration of intravenous contrast. RADIATION DOSE REDUCTION: This exam was performed according to the departmental dose-optimization program which includes automated exposure control, adjustment of the mA and/or kV according to patient size and/or use of iterative reconstruction technique. CONTRAST:  133m OMNIPAQUE IOHEXOL 300 MG/ML  SOLN COMPARISON:  12/30/2020 FINDINGS: Lower chest: Extensive multi-vessel coronary artery calcification. Global cardiac size within normal limits. No pericardial effusion. Stable bibasilar pulmonary parenchymal scarring. Moderate hiatal hernia. Hepatobiliary: No focal liver abnormality is seen. Interval cholecystectomy. No biliary dilatation. Pancreas: Unremarkable Spleen: Unremarkable Adrenals/Urinary Tract: The adrenal glands are unremarkable. The kidneys are normal in size and position. Multiple simple cortical cysts are seen within the kidneys bilaterally. No follow-up imaging is recommended for these lesions. There is mild left hydronephrosis and moderate left asymmetric  perinephric stranding secondary to an obstructing 3 mm calculus within the mid left ureter at axial image # 75/2. An additional 3 mm calculus is seen within the distal left ureter just proximal to the ureteral anastomosis. No additional renal or ureteral calculi. No hydronephrosis on the right. Cystectomy and ileal conduit formation has been performed with the urostomy noted within the right mid abdomen. Small associated fat containing parastomal hernia. 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 Severe sigmoid diverticulosis without superimposed acute inflammatory change. The stomach, small bowel, and large bowel are otherwise unremarkable. Appendix absent. No free intraperitoneal gas or fluid. Vascular/Lymphatic: Moderate aortoiliac atherosclerotic calcification. No aortic aneurysm. No pathologic adenopathy within the abdomen and pelvis. Reproductive: Status post prostatectomy. Other: Fat containing left parasagittal moderate supraumbilical and small infraumbilical ventral hernias are identified. Musculoskeletal: No acute bone abnormality. No lytic or blastic bone  lesion. IMPRESSION: 1. Obstructing 3 mm calculus within the mid left ureter resulting in mild left hydronephrosis and moderate left perinephric stranding. Superimposed nonobstructing 3 mm calculus within the distal left ureter just proximal to the ureteral anastomosis. 2. Extensive multi-vessel coronary artery calcification. 3. Moderate hiatal hernia. 4. Severe sigmoid diverticulosis without superimposed acute inflammatory change. 5. Status post cystoprostatectomy and ileal conduit formation with urostomy noted within the right mid abdomen. Small associated fat containing parastomal hernia. 6. Fat containing left parasagittal moderate supraumbilical and small infraumbilical ventral hernias. Aortic Atherosclerosis (ICD10-I70.0). Electronically Signed   By: Fidela Salisbury M.D.   On: 03/20/2022 20:39     Procedures Procedures    Medications Ordered in ED Medications  cefTRIAXone (ROCEPHIN) 1 g in sodium chloride 0.9 % 100 mL IVPB (1 g Intravenous New Bag/Given 03/20/22 2306)  sodium chloride 0.9 % bolus 1,000 mL (0 mLs Intravenous Stopped 03/20/22 2006)  ondansetron (ZOFRAN) injection 4 mg (4 mg Intravenous Given 03/20/22 1906)  iohexol (OMNIPAQUE) 300 MG/ML solution 100 mL (100 mLs Intravenous Contrast Given 03/20/22 2018)  sodium chloride 0.9 % bolus 1,000 mL (0 mLs Intravenous Stopped 03/20/22 2308)    ED Course/ Medical Decision Making/ A&P                             Medical Decision Making Amount and/or Complexity of Data Reviewed Labs: ordered. Radiology: ordered.  Risk Prescription drug management.   This patient presents to the ED with chief complaint(s) of generalized weakness, LLQ abdominal pain and nausea with pertinent past medical history of cystoprostatectomy with ileal conduit, hypertension, bladder cancer .The complaint involves an extensive differential diagnosis and also carries with it a high risk of complications and morbidity.    The differential diagnosis includes diverticulitis, nephrolithiasis, ureterolithiasis, pyelonephritis,  gastroenteritis, bowel obstruction, COVID, influenza, other viral etiology  The initial plan is to obtain baseline labs and CT abdomen pelvis  Additional history obtained: Additional history obtained from spouse Records reviewed  internal medicine  Initial Assessment:   On exam, patient is ill-appearing but non toxic.  Heart rate is 100 with regular rhythm.  Lungs clear to auscultation bilaterally.  Skin is warm and dry.  Abdomen is soft with tenderness in the left upper and lower quadrant.  No suprapubic tenderness.  Bowel sounds are normal. There is a urostomy bag present with urine.    Independent ECG/labs interpretation:  The following labs were independently interpreted:  CBC significant for leukocytosis, WB of 16.0 with  neutrophilia.  No evidence of anemia.  CMP with hyponatremia, hyperglycemia, and hypocalcemia.  Cr is within normal range.  Negative for COVID, influenza, and RSV.  UA signifcant for cloudy, nitrite positive, large leukocyte urine.  This is to be expected per urology due to patient having a cystoprostatectomy with ileal conduit.   Independent visualization and interpretation of imaging: I independently visualized the following imaging with scope of interpretation limited to determining acute life threatening conditions related to emergency care: CT abdomen pelvis, which revealed Obstructing 3 mm calculus within the mid left ureter resulting in mild left hydronephrosis and moderate left perinephric stranding, which may be suggestive of pyelonephritis.  There are also ventral hernias that do not appear strangulated.  I agree with radiologist interpretation.    Treatment and Reassessment: Patient's nausea was treated with Zofran and he was given IV fluids, which he states made him feel better.  Will treat with Rocephin to cover for possible pyelonephritis.  Discussed work up with patient and his wife who are in agreement with hospital admission for treatment.    Consultations obtained: I requested consultation with on-call urology provider and spoke with Dr. Jeffie Pollock who recommended hospital admission at [facility] for possible nephrostomy tube placement.   Spoke with on-call hospitalist provider, Dr. Carlynn Purl who agreed with hospitalization.   Disposition:   Patient to be admitted to [facility] per Dr. Ralene Muskrat recommendation.          Final Clinical Impression(s) / ED Diagnoses Final diagnoses:  Hydronephrosis with obstructing calculus  Pyelonephritis    Rx / DC Orders ED Discharge Orders     None         Pat Kocher, PA 03/20/22 2312    Godfrey Pick, MD 03/24/22 [phone]

## 2022-03-21 ENCOUNTER — Inpatient Hospital Stay (HOSPITAL_COMMUNITY): Payer: Medicare Other

## 2022-03-21 DIAGNOSIS — N132 Hydronephrosis with renal and ureteral calculous obstruction: Secondary | ICD-10-CM | POA: Diagnosis not present

## 2022-03-21 DIAGNOSIS — I10 Essential (primary) hypertension: Secondary | ICD-10-CM

## 2022-03-21 DIAGNOSIS — F419 Anxiety disorder, unspecified: Secondary | ICD-10-CM

## 2022-03-21 DIAGNOSIS — R739 Hyperglycemia, unspecified: Secondary | ICD-10-CM | POA: Diagnosis not present

## 2022-03-21 DIAGNOSIS — N390 Urinary tract infection, site not specified: Secondary | ICD-10-CM

## 2022-03-21 LAB — CBC WITH DIFFERENTIAL/PLATELET
Abs Immature Granulocytes: 0.07 10*3/uL (ref 0.00–0.07)
Basophils Absolute: 0 10*3/uL (ref 0.0–0.1)
Basophils Relative: 0 %
Eosinophils Absolute: 0 10*3/uL (ref 0.0–0.5)
Eosinophils Relative: 0 %
HCT: 41.3 % (ref 39.0–52.0)
Hemoglobin: 13.7 g/dL (ref 13.0–17.0)
Immature Granulocytes: 1 %
Lymphocytes Relative: 2 %
Lymphs Abs: 0.3 10*3/uL — ABNORMAL LOW (ref 0.7–4.0)
MCH: 31.4 pg (ref 26.0–34.0)
MCHC: 33.2 g/dL (ref 30.0–36.0)
MCV: 94.5 fL (ref 80.0–100.0)
Monocytes Absolute: 1 10*3/uL (ref 0.1–1.0)
Monocytes Relative: 7 %
Neutro Abs: 13.4 10*3/uL — ABNORMAL HIGH (ref 1.7–7.7)
Neutrophils Relative %: 90 %
Platelets: 189 10*3/uL (ref 150–400)
RBC: 4.37 MIL/uL (ref 4.22–5.81)
RDW: 12.9 % (ref 11.5–15.5)
WBC: 14.8 10*3/uL — ABNORMAL HIGH (ref 4.0–10.5)
nRBC: 0 % (ref 0.0–0.2)

## 2022-03-21 LAB — COMPREHENSIVE METABOLIC PANEL
ALT: 17 U/L (ref 0–44)
AST: 20 U/L (ref 15–41)
Albumin: 3.2 g/dL — ABNORMAL LOW (ref 3.5–5.0)
Alkaline Phosphatase: 54 U/L (ref 38–126)
Anion gap: 9 (ref 5–15)
BUN: 18 mg/dL (ref 8–23)
CO2: 23 mmol/L (ref 22–32)
Calcium: 8.1 mg/dL — ABNORMAL LOW (ref 8.9–10.3)
Chloride: 105 mmol/L (ref 98–111)
Creatinine, Ser: 1.19 mg/dL (ref 0.61–1.24)
GFR, Estimated: >60 mL/min (ref >60)
Glucose, Bld: 145 mg/dL — ABNORMAL HIGH (ref 70–99)
Potassium: 3.9 mmol/L (ref 3.5–5.1)
Sodium: 137 mmol/L (ref 135–145)
Total Bilirubin: 0.8 mg/dL (ref 0.3–1.2)
Total Protein: 6.3 g/dL — ABNORMAL LOW (ref 6.5–8.1)

## 2022-03-21 LAB — HEMOGLOBIN A1C
Hgb A1c MFr Bld: 6.1 % — ABNORMAL HIGH (ref 4.8–5.6)
Mean Plasma Glucose: 128.37 mg/dL

## 2022-03-21 LAB — MAGNESIUM: Magnesium: 1.7 mg/dL (ref 1.7–2.4)

## 2022-03-21 MED ORDER — POLYETHYLENE GLYCOL 3350 17 G PO PACK
17.0000 g | PACK | Freq: Once | ORAL | Status: AC
  Administered 2022-03-21: 17 g via ORAL
  Filled 2022-03-21: qty 1

## 2022-03-21 MED ORDER — METRONIDAZOLE 500 MG/100ML IV SOLN
500.0000 mg | Freq: Two times a day (BID) | INTRAVENOUS | Status: DC
  Administered 2022-03-21 – 2022-03-23 (×4): 500 mg via INTRAVENOUS
  Filled 2022-03-21 (×4): qty 100

## 2022-03-21 MED ORDER — IRBESARTAN 75 MG PO TABS: 75.0000 mg | ORAL_TABLET | Freq: Every day | ORAL | Status: DC

## 2022-03-21 MED ORDER — FLUTICASONE PROPIONATE 50 MCG/ACT NA SUSP
2.0000 | Freq: Every day | NASAL | Status: DC
  Administered 2022-03-21 – 2022-03-23 (×3): 2 via NASAL
  Filled 2022-03-21 (×2): qty 16

## 2022-03-21 MED ORDER — SENNOSIDES-DOCUSATE SODIUM 8.6-50 MG PO TABS
1.0000 | ORAL_TABLET | Freq: Two times a day (BID) | ORAL | Status: DC
  Administered 2022-03-21 – 2022-03-23 (×4): 1 via ORAL
  Filled 2022-03-21 (×5): qty 1

## 2022-03-21 MED ORDER — LORAZEPAM 1 MG PO TABS
1.0000 mg | ORAL_TABLET | Freq: Three times a day (TID) | ORAL | Status: DC | PRN
  Administered 2022-03-21 – 2022-03-23 (×4): 1 mg via ORAL
  Filled 2022-03-21 (×5): qty 1

## 2022-03-21 MED ORDER — SODIUM CHLORIDE 0.9 % IV SOLN
2.0000 g | INTRAVENOUS | Status: DC
  Administered 2022-03-21 – 2022-03-22 (×2): 2 g via INTRAVENOUS
  Filled 2022-03-21 (×2): qty 20

## 2022-03-21 MED ORDER — HYDRALAZINE HCL 20 MG/ML IJ SOLN: 5.0000 mg | Freq: Four times a day (QID) | INTRAMUSCULAR | Status: DC | PRN

## 2022-03-21 MED ORDER — IRBESARTAN 75 MG PO TABS
75.0000 mg | ORAL_TABLET | Freq: Every day | ORAL | Status: DC
  Administered 2022-03-22: 75 mg via ORAL
  Filled 2022-03-21: qty 1

## 2022-03-21 NOTE — Assessment & Plan Note (Signed)
-  As seen on CT -Urology plans for possible nephrostomy tube -Admit to [facility] -There is perinephric stranding, concerning for pyelonephritis -Continue Rocephin -Pain control with pain scale -NPO for likely procedure -Continue to monitor

## 2022-03-21 NOTE — Assessment & Plan Note (Signed)
-  Continue Ativan

## 2022-03-21 NOTE — Assessment & Plan Note (Signed)
-  UA indicative of UTI -Continue Rocephin -Urine culture pending -Previous micro - Proteus Mirabilis that is sensitive to Rocephin among other antibiotics -Continue to monitor

## 2022-03-21 NOTE — Assessment & Plan Note (Signed)
-  No official documented history of diabetes mellitus. - Check hemoglobin A1c

## 2022-03-21 NOTE — Plan of Care (Signed)
  Problem: Education: Goal: Knowledge of General Education information will improve Description: Including pain rating scale, medication(s)/side effects and non-pharmacologic comfort measures Outcome: Progressing   Problem: Activity: Goal: Risk for activity intolerance will decrease Outcome: Progressing   Problem: Nutrition: Goal: Adequate nutrition will be maintained Outcome: Progressing

## 2022-03-21 NOTE — Consult Note (Addendum)
Subjective:    Consult requested by Dr. Florencia Reasons.  Benjamin Yang is an 87 yo male with a history of bladder cancer managed with cystectomy and ileal conduit diversion by Dr. Tresa Moore in 2014.  He had the onset Friday of left flank pain that progressed and he came to the ER yesterday with more severe flank pain and nausea.   He was found on CT to have a 55m left mid ureteral stone with mild hydronephrosis and perinephric stranding concerning for pyelonephritis.  He has had proteus on cultures in the past.  He has a leukocytosis but is afebrile.  He continues to have pain this morning.   ROS:  Review of Systems  Constitutional:  Positive for malaise/fatigue. Negative for chills and fever.  Gastrointestinal:  Positive for abdominal pain and nausea.  Genitourinary:  Positive for flank pain.  Neurological:  Positive for weakness.  All other systems reviewed and are negative.   Allergies  Allergen Reactions   Percocet [Oxycodone-Acetaminophen]     Patient says he can take this for pain-just bothered his stomach a little bit before    Past Medical History:  Diagnosis Date   Cancer ([place]    bladder   Hypertension    Prostatitis     Past Surgical History:  Procedure Laterality Date   BACK SURGERY     CHOLECYSTECTOMY N/A 01/06/2021   Procedure: LAPAROSCOPIC CHOLECYSTECTOMY;  Surgeon: JAviva Signs MD;  Location: [facility];  Service: General;  Laterality: N/A;   CYSTOSCOPY N/A 09/15/2012   Procedure: CYSTOSCOPY;  Surgeon: TAlexis Frock MD;  Location: [facility];  Service: Urology;  Laterality: N/A;   LYMPHADENECTOMY Bilateral 09/15/2012   Procedure:  Bilateral Pelvic Lymph Node Dissection;  Surgeon: TAlexis Frock MD;  Location: [facility];  Service: Urology;  Laterality: Bilateral;   ROBOT ASSISTED LAPAROSCOPIC COMPLETE CYSTECT ILEAL CONDUIT N/A 09/15/2012   Procedure: Robotic Cystoprostatectomy, Open Ileal Conduit Diversion,  Indocyanine Green Dye Injection into Bladder ;  Surgeon: TAlexis Frock MD;   Location: [facility];  Service: Urology;  Laterality: N/A;  Robotic Cystoprostatectomy, Bilateral Pelvic Lymph Node Dissection, Open Ileal Conduit Diversion, Cysto, Indocyanine Green Dye Injection into Bladder     ROBOTIC ASSISTED LAPAROSCOPIC LYSIS OF ADHESION N/A 09/15/2012   Procedure: ROBOTIC ASSISTED LAPAROSCOPIC LYSIS OF ADHESION;  Surgeon: TAlexis Frock MD;  Location: [facility];  Service: Urology;  Laterality: N/A;    Social History   Socioeconomic History   Marital status: Married    Spouse name: DRosary Lively  Number of children: Not on file   Years of education: 12   Highest education level: 12th grade  Occupational History   Not on file  Tobacco Use   Smoking status: Never    Passive exposure: Never   Smokeless tobacco: Never  Vaping Use   Vaping Use: Never used  Substance and Sexual Activity   Alcohol use: Yes    Comment: 2-3 oz of liquour daily   Drug use: No   Sexual activity: Not Currently    Partners: Female  Other Topics Concern   Not on file  Social History Narrative   Not on file   Social Determinants of Health   Financial Resource Strain: Low Risk  (01/06/2022)   Overall Financial Resource Strain (CARDIA)    Difficulty of Paying Living Expenses: Not hard at all  Food Insecurity: No Food Insecurity (03/21/2022)   Hunger Vital Sign    Worried About Running Out of Food in the Last Year: Never true  Ran Out of Food in the Last Year: Never true  Transportation Needs: No Transportation Needs (03/21/2022)   PRAPARE - Hydrologist (Medical): No    Lack of Transportation (Non-Medical): No  Physical Activity: Inactive (12/12/2021)   Exercise Vital Sign    Days of Exercise per Week: 0 days    Minutes of Exercise per Session: 0 min  Stress: Stress Concern Present (12/12/2021)   [place]    Feeling of Stress : To some extent  Social Connections: Socially  Integrated (12/12/2021)   Social Connection and Isolation Panel [NHANES]    Frequency of Communication with Friends and Family: More than three times a week    Frequency of Social Gatherings with Friends and Family: More than three times a week    Attends Religious Services: More than 4 times per year    Active Member of [employer] or Organizations: Yes    Attends Archivist Meetings: More than 4 times per year    Marital Status: Married  Human resources officer Violence: Not At Risk (03/21/2022)   Humiliation, Afraid, Rape, and Kick questionnaire    Fear of Current or Ex-Partner: No    Emotionally Abused: No    Physically Abused: No    Sexually Abused: No    History reviewed. No pertinent family history.  Anti-infectives: Anti-infectives (From admission, onward)    Start     Dose/Rate Route Frequency Ordered Stop   03/20/22 2245  cefTRIAXone (ROCEPHIN) 1 g in sodium chloride 0.9 % 100 mL IVPB        1 g 200 mL/hr over 30 Minutes Intravenous  Once 03/20/22 2242 03/21/22 0003       Current Facility-Administered Medications  Medication Dose Route Frequency Provider Last Rate Last Admin   acetaminophen (TYLENOL) tablet 650 mg  650 mg Oral Q6H PRN Zierle-Ghosh, Asia B, DO       Or   acetaminophen (TYLENOL) suppository 650 mg  650 mg Rectal Q6H PRN Zierle-Ghosh, Asia B, DO       fluticasone (FLONASE) 50 MCG/ACT nasal spray 2 spray  2 spray Each Nare Daily Florencia Reasons, MD       heparin injection 5,000 Units  5,000 Units Subcutaneous Q8H Zierle-Ghosh, Asia B, DO   5,000 Units at 03/21/22 0002   irbesartan (AVAPRO) tablet 300 mg  300 mg Oral Daily Zierle-Ghosh, Asia B, DO       ketorolac (TORADOL) 15 MG/ML injection 15 mg  15 mg Intravenous Q6H PRN Zierle-Ghosh, Asia B, DO       LORazepam (ATIVAN) tablet 1 mg  1 mg Oral Q8H PRN Florencia Reasons, MD       ondansetron [facility]) tablet 4 mg  4 mg Oral Q6H PRN Zierle-Ghosh, Asia B, DO       Or   ondansetron (ZOFRAN) injection 4 mg  4 mg Intravenous Q6H  PRN Zierle-Ghosh, Asia B, DO       oxyCODONE (Oxy IR/ROXICODONE) immediate release tablet 5 mg  5 mg Oral Q4H PRN Zierle-Ghosh, Asia B, DO         Objective: Vital signs in last 24 hours: BP 123/73 (BP Location: Left Arm)   Pulse 80   Temp 98.6 F (37 C) (Oral)   Resp 20   Ht '6\' 2"'$  (1.88 m)   Wt 88.5 kg   SpO2 96%   BMI 25.05 kg/m   Intake/Output from previous day: 01/20 0701 - 01/21  0700 In: 1100 [IV Piggyback:1100] Out: -  Intake/Output this shift: Total I/O In: -  Out: 175 [Urine:175]   Physical Exam Vitals reviewed.  Constitutional:      Appearance: He is well-developed.  Cardiovascular:     Rate and Rhythm: Normal rate and regular rhythm.  Pulmonary:     Effort: Pulmonary effort is normal. No respiratory distress.     Breath sounds: Normal breath sounds.  Abdominal:     General: Abdomen is flat. Bowel sounds are normal.     Palpations: Abdomen is soft.     Tenderness: There is abdominal tenderness in the left upper quadrant.     Comments: There is a pink, productive urostomy in the RLQ and a well healed midline wound.   Neurological:     Mental Status: He is alert.     Lab Results:  Results for orders placed or performed during the hospital encounter of 03/20/22 (from the past 24 hour(s))  Resp panel by RT-PCR (RSV, Flu A&B, Covid) Anterior Nasal Swab     Status: None   Collection Time: 03/20/22  7:02 PM   Specimen: Anterior Nasal Swab  Result Value Ref Range   SARS Coronavirus 2 by RT PCR NEGATIVE NEGATIVE   Influenza A by PCR NEGATIVE NEGATIVE   Influenza B by PCR NEGATIVE NEGATIVE   Resp Syncytial Virus by PCR NEGATIVE NEGATIVE  Lipase, blood     Status: None   Collection Time: 03/20/22  7:11 PM  Result Value Ref Range   Lipase 25 11 - 51 U/L  Comprehensive metabolic panel     Status: Abnormal   Collection Time: 03/20/22  7:11 PM  Result Value Ref Range   Sodium 133 (L) 135 - 145 mmol/L   Potassium 4.2 3.5 - 5.1 mmol/L   Chloride 102 98 - 111  mmol/L   CO2 21 (L) 22 - 32 mmol/L   Glucose, Bld 198 (H) 70 - 99 mg/dL   BUN 17 8 - 23 mg/dL   Creatinine, Ser 1.13 0.61 - 1.24 mg/dL   Calcium 8.5 (L) 8.9 - 10.3 mg/dL   Total Protein 6.7 6.5 - 8.1 g/dL   Albumin 3.6 3.5 - 5.0 g/dL   AST 22 15 - 41 U/L   ALT 21 0 - 44 U/L   Alkaline Phosphatase 60 38 - 126 U/L   Total Bilirubin 1.0 0.3 - 1.2 mg/dL   GFR, Estimated >60 >60 mL/min   Anion gap 10 5 - 15  CBC with Differential/Platelet     Status: Abnormal   Collection Time: 03/20/22  7:37 PM  Result Value Ref Range   WBC 16.0 (H) 4.0 - 10.5 K/uL   RBC 4.63 4.22 - 5.81 MIL/uL   Hemoglobin 14.8 13.0 - 17.0 g/dL   HCT 44.4 39.0 - 52.0 %   MCV 95.9 80.0 - 100.0 fL   MCH 32.0 26.0 - 34.0 pg   MCHC 33.3 30.0 - 36.0 g/dL   RDW 12.6 11.5 - 15.5 %   Platelets 186 150 - 400 K/uL   nRBC 0.0 0.0 - 0.2 %   Neutrophils Relative % 90 %   Neutro Abs 14.3 (H) 1.7 - 7.7 K/uL   Lymphocytes Relative 1 %   Lymphs Abs 0.2 (L) 0.7 - 4.0 K/uL   Monocytes Relative 9 %   Monocytes Absolute 1.4 (H) 0.1 - 1.0 K/uL   Eosinophils Relative 0 %   Eosinophils Absolute 0.0 0.0 - 0.5 K/uL   Basophils Relative 0 %  Basophils Absolute 0.0 0.0 - 0.1 K/uL   Immature Granulocytes 0 %   Abs Immature Granulocytes 0.06 0.00 - 0.07 K/uL  Urinalysis, Routine w reflex microscopic Urine, Unspecified Source     Status: Abnormal   Collection Time: 03/20/22  9:15 PM  Result Value Ref Range   Color, Urine YELLOW YELLOW   APPearance CLOUDY (A) CLEAR   Specific Gravity, Urine 1.017 1.005 - 1.030   pH 7.0 5.0 - 8.0   Glucose, UA NEGATIVE NEGATIVE mg/dL   Hgb urine dipstick SMALL (A) NEGATIVE   Bilirubin Urine NEGATIVE NEGATIVE   Ketones, ur 5 (A) NEGATIVE mg/dL   Protein, ur 100 (A) NEGATIVE mg/dL   Nitrite POSITIVE (A) NEGATIVE   Leukocytes,Ua LARGE (A) NEGATIVE   RBC / HPF 11-20 0 - 5 RBC/hpf   WBC, UA >50 (H) 0 - 5 WBC/hpf   Bacteria, UA NONE SEEN NONE SEEN   Squamous Epithelial / HPF 0-5 0 - 5 /HPF   WBC  Clumps PRESENT    Mucus PRESENT    Budding Yeast PRESENT   CBC with Differential/Platelet     Status: Abnormal   Collection Time: 03/21/22  4:06 AM  Result Value Ref Range   WBC 14.8 (H) 4.0 - 10.5 K/uL   RBC 4.37 4.22 - 5.81 MIL/uL   Hemoglobin 13.7 13.0 - 17.0 g/dL   HCT 41.3 39.0 - 52.0 %   MCV 94.5 80.0 - 100.0 fL   MCH 31.4 26.0 - 34.0 pg   MCHC 33.2 30.0 - 36.0 g/dL   RDW 12.9 11.5 - 15.5 %   Platelets 189 150 - 400 K/uL   nRBC 0.0 0.0 - 0.2 %   Neutrophils Relative % 90 %   Neutro Abs 13.4 (H) 1.7 - 7.7 K/uL   Lymphocytes Relative 2 %   Lymphs Abs 0.3 (L) 0.7 - 4.0 K/uL   Monocytes Relative 7 %   Monocytes Absolute 1.0 0.1 - 1.0 K/uL   Eosinophils Relative 0 %   Eosinophils Absolute 0.0 0.0 - 0.5 K/uL   Basophils Relative 0 %   Basophils Absolute 0.0 0.0 - 0.1 K/uL   Immature Granulocytes 1 %   Abs Immature Granulocytes 0.07 0.00 - 0.07 K/uL    BMET Recent Labs    03/20/22 1911  NA 133*  K 4.2  CL 102  CO2 21*  GLUCOSE 198*  BUN 17  CREATININE 1.13  CALCIUM 8.5*   PT/INR No results for input(s): "LABPROT", "INR" in the last 72 hours. ABG No results for input(s): "PHART", "HCO3" in the last 72 hours.  Invalid input(s): "PCO2", "PO2"  Studies/Results: CT Abdomen Pelvis W Contrast  Result Date: 03/20/2022 CLINICAL DATA:  Left lower quadrant abdominal pain EXAM: CT ABDOMEN AND PELVIS WITH CONTRAST TECHNIQUE: Multidetector CT imaging of the abdomen and pelvis was performed using the standard protocol following bolus administration of intravenous contrast. RADIATION DOSE REDUCTION: This exam was performed according to the departmental dose-optimization program which includes automated exposure control, adjustment of the mA and/or kV according to patient size and/or use of iterative reconstruction technique. CONTRAST:  165m OMNIPAQUE IOHEXOL 300 MG/ML  SOLN COMPARISON:  12/30/2020 FINDINGS: Lower chest: Extensive multi-vessel coronary artery calcification. Global  cardiac size within normal limits. No pericardial effusion. Stable bibasilar pulmonary parenchymal scarring. Moderate hiatal hernia. Hepatobiliary: No focal liver abnormality is seen. Interval cholecystectomy. No biliary dilatation. Pancreas: Unremarkable Spleen: Unremarkable Adrenals/Urinary Tract: The adrenal glands are unremarkable. The kidneys are normal in size and position. Multiple  simple cortical cysts are seen within the kidneys bilaterally. No follow-up imaging is recommended for these lesions. There is mild left hydronephrosis and moderate left asymmetric perinephric stranding secondary to an obstructing 3 mm calculus within the mid left ureter at axial image # 75/2. An additional 3 mm calculus is seen within the distal left ureter just proximal to the ureteral anastomosis. No additional renal or ureteral calculi. No hydronephrosis on the right. Cystectomy and ileal conduit formation has been performed with the urostomy noted within the right mid abdomen. Small associated fat containing parastomal hernia. 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 Severe sigmoid diverticulosis without superimposed acute inflammatory change. The stomach, small bowel, and large bowel are otherwise unremarkable. Appendix absent. No free intraperitoneal gas or fluid. Vascular/Lymphatic: Moderate aortoiliac atherosclerotic calcification. No aortic aneurysm. No pathologic adenopathy within the abdomen and pelvis. Reproductive: Status post prostatectomy. Other: Fat containing left parasagittal moderate supraumbilical and small infraumbilical ventral hernias are identified. Musculoskeletal: No acute bone abnormality. No lytic or blastic bone lesion. IMPRESSION: 1. Obstructing 3 mm calculus within the mid left ureter resulting in mild left hydronephrosis and moderate left perinephric stranding. Superimposed nonobstructing 3 mm calculus within the distal left ureter just proximal to the  ureteral anastomosis. 2. Extensive multi-vessel coronary artery calcification. 3. Moderate hiatal hernia. 4. Severe sigmoid diverticulosis without superimposed acute inflammatory change. 5. Status post cystoprostatectomy and ileal conduit formation with urostomy noted within the right mid abdomen. Small associated fat containing parastomal hernia. 6. Fat containing left parasagittal moderate supraumbilical and small infraumbilical ventral hernias. Aortic Atherosclerosis (ICD10-I70.0). Electronically Signed   By: Fidela Salisbury M.D.   On: 03/20/2022 20:39     Assessment/Plan: Left mid ureteral stone with hydronephrosis and concern for pyelonephritis.  I spoke with IR and they don't feel there is significant hydro which would make tube placement difficulty.  Since he is clinically improved without fever, we should get a renal US in the morning to reassess the hydro and he can have a diet.   UTI.  He has had proteus in the past which can be associated with struvite stones.  Continue antibiotic therapy.           No follow-ups on file.    CC: Dr. Florencia Reasons.      Irine Seal 03/21/2022

## 2022-03-21 NOTE — H&P (Signed)
History and Physical    Patient: Benjamin Yang NIO:[id number] DOB: [birth year] DOA: 03/20/2022 DOS: the patient was seen and examined on 03/21/2022 PCP: Monico Blitz, MD  Patient coming from: Home  Chief Complaint:  Chief Complaint  Patient presents with   Abdominal Pain   HPI: Benjamin Yang is a 87 y.o. male with medical history significant of cancer, pretension, anxiety, and more presents the ED with a chief complaint of abdominal and back pain.  Patient reports that he has had left-sided pain.  When he points that it is actually his flank.  The pain started 2 days ago.  Its been worse since it started.  At first it was intermittent and now it has been constant.  When he feels like he has to go to the bathroom the pain is worse.  Patient has not noticed any hematuria.  Patient has a urostomy, so he has not noticed any dysuria.  He has had no fever.  Patient reports general weakness and malaise.  He took hydrocodone which helped the pain, but also made him nauseous.  He has not had any vomiting.  His last bowel movement was yesterday.  He has had a decreased appetite since it started.  His last normal meal was also yesterday.  Patient has no other complaints at this time.  Patient does not smoke.  He drinks 2 ounces of alcohol per day but is never had withdrawal.  He does not use illicit drugs.  He is vaccinated for COVID.  Patient reports that he would prefer to be DNR. Review of Systems: As mentioned in the history of present illness. All other systems reviewed and are negative. Past Medical History:  Diagnosis Date   Cancer [facility])    bladder   Hypertension    Prostatitis    Past Surgical History:  Procedure Laterality Date   BACK SURGERY     CHOLECYSTECTOMY N/A 01/06/2021   Procedure: LAPAROSCOPIC CHOLECYSTECTOMY;  Surgeon: Aviva Signs, MD;  Location: [facility];  Service: General;  Laterality: N/A;   CYSTOSCOPY N/A 09/15/2012   Procedure: CYSTOSCOPY;  Surgeon: Alexis Frock, MD;   Location: [facility];  Service: Urology;  Laterality: N/A;   LYMPHADENECTOMY Bilateral 09/15/2012   Procedure:  Bilateral Pelvic Lymph Node Dissection;  Surgeon: Alexis Frock, MD;  Location: [facility];  Service: Urology;  Laterality: Bilateral;   ROBOT ASSISTED LAPAROSCOPIC COMPLETE CYSTECT ILEAL CONDUIT N/A 09/15/2012   Procedure: Robotic Cystoprostatectomy, Open Ileal Conduit Diversion,  Indocyanine Green Dye Injection into Bladder ;  Surgeon: Alexis Frock, MD;  Location: [facility];  Service: Urology;  Laterality: N/A;  Robotic Cystoprostatectomy, Bilateral Pelvic Lymph Node Dissection, Open Ileal Conduit Diversion, Cysto, Indocyanine Green Dye Injection into Bladder     ROBOTIC ASSISTED LAPAROSCOPIC LYSIS OF ADHESION N/A 09/15/2012   Procedure: ROBOTIC ASSISTED LAPAROSCOPIC LYSIS OF ADHESION;  Surgeon: Alexis Frock, MD;  Location: [facility];  Service: Urology;  Laterality: N/A;   Social History:  reports that he has never smoked. He has never been exposed to tobacco smoke. He has never used smokeless tobacco. He reports current alcohol use. He reports that he does not use drugs.  Allergies  Allergen Reactions   Percocet [Oxycodone-Acetaminophen]     Patient says he can take this for pain-just bothered his stomach a little bit before    History reviewed. No pertinent family history.  Prior to Admission medications   Medication Sig Start Date End Date Taking? Authorizing Provider  HYDROcodone-acetaminophen (NORCO/VICODIN) 5-325 MG tablet Take 1  tablet by mouth every 6 (six) hours as needed for moderate pain. 12/30/20   Fredia Sorrow, MD  ibuprofen (ADVIL) 200 MG tablet Take 600 mg by mouth every 6 (six) hours as needed.    [provider]  LORazepam (ATIVAN) 1 MG tablet Take 1 tablet by mouth 2 (two) times daily as needed. 03/18/17   [provider]  ondansetron (ZOFRAN ODT) 4 MG disintegrating tablet '4mg'$  ODT q4 hours prn nausea/vomit 03/18/17   Milton Ferguson, MD  telmisartan  (MICARDIS) 80 MG tablet Take 80 mg by mouth daily.    [provider]    Physical Exam: Vitals:   03/20/22 2000 03/20/22 2305 03/20/22 2330 03/21/22 0000  BP: 127/62 123/62 128/63 118/61  Pulse: 94 94 95 92  Resp: '20 20 19 '$ (!) 22  Temp:  98.3 F (36.8 C)  97.8 F (36.6 C)  TempSrc:  Oral  Oral  SpO2: 97% 93% 95% 94%  Weight:      Height:       1.  General: Patient lying supine in bed,  no acute distress   2. Psychiatric: Alert and oriented x 3, mood and behavior normal for situation, pleasant and cooperative with exam   3. Neurologic: Speech and language are normal, face is symmetric, moves all 4 extremities voluntarily, at baseline without acute deficits on limited exam   4. HEENMT:  Head is atraumatic, normocephalic, pupils reactive to light, neck is supple, trachea is midline, mucous membranes are moist   5. Respiratory : Lungs are clear to auscultation bilaterally without wheezing, rhonchi, rales, no cyanosis, no increase in work of breathing or accessory muscle use   6. Cardiovascular : Heart rate normal, rhythm is regular, no rubs or gallops, no peripheral edema, peripheral pulses palpated   7. Gastrointestinal:  Abdomen is soft, nondistended, nontender to palpation bowel sounds active, no masses or organomegaly palpated   8. Skin:  Skin is warm, dry and intact without rashes, acute lesions, or ulcers on limited exam   9.Musculoskeletal:  No acute deformities or trauma, no asymmetry in tone, no peripheral edema, peripheral pulses palpated, no tenderness to palpation in the extremities  Data Reviewed: In the ED Temp 97.9, heart rate 90-98, respiratory 18-20, blood pressure 115/60-130/68, satting 96-97% Leukocytosis of 16.0 which is consistent with UTI, hemoglobin 14.8 Chemistry is unremarkable aside from a hyperglycemia - Negative COVID and flu CT abdomen pelvis shows obstructing 3 mm calculus within the mid left ureter with moderate to severe  hydronephrosis Urology consulted and recommends admission to [facility] long with likely plan for  nephrostomy tube UA indicative of UTI Admission requested for further management of obstructing calculus   Assessment and Plan: * Hydronephrosis with obstructing calculus -As seen on CT -Urology plans for possible nephrostomy tube -Admit to [facility] -There is perinephric stranding, concerning for pyelonephritis -Continue Rocephin -Pain control with pain scale -NPO for likely procedure -Continue to monitor  Hyperglycemia - No official documented history of diabetes mellitus. - Check hemoglobin A1c  Anxiety -Continue Ativan  Essential hypertension -Continue ARB  UTI (urinary tract infection) -UA indicative of UTI -Continue Rocephin -Urine culture pending -Previous micro - Proteus Mirabilis that is sensitive to Rocephin among other antibiotics -Continue to monitor      Advance Care Planning:   Code Status: DNR   Consults: Urology  Family Communication: No family at bedside  Severity of Illness: The appropriate patient status for this patient is INPATIENT. Inpatient status is judged to be reasonable and necessary in  order to provide the required intensity of service to ensure the patient's safety. The patient's presenting symptoms, physical exam findings, and initial radiographic and laboratory data in the context of their chronic comorbidities is felt to place them at high risk for further clinical deterioration. Furthermore, it is not anticipated that the patient will be medically stable for discharge from the hospital within 2 midnights of admission.   * I certify that at the point of admission it is my clinical judgment that the patient will require inpatient hospital care spanning beyond 2 midnights from the point of admission due to high intensity of service, high risk for further deterioration and high frequency of surveillance required.*  Author: Rolla Plate,  DO 03/21/2022 1:31 AM  For on call review [email or web address].

## 2022-03-21 NOTE — ED Notes (Signed)
[facility] here to transport pt to [facility]- pt dentures placed in belongings bag and given to [facility] to transport with pt.

## 2022-03-21 NOTE — Assessment & Plan Note (Signed)
-  Continue ARB

## 2022-03-21 NOTE — Progress Notes (Signed)
Benjamin Yang is admitted early this morning, detail please see HPI.  Briefly, h/o bladder cancer status post robotic cystoprostatectomy with node dissection and ileal conduit urinary diversion, who presents with intermittent LLQ pain with nausea ,  found to have  "Obstructing 3 mm calculus within the mid left ureter resulting in mild left hydronephrosis and moderate left perinephric stranding. Superimposed nonobstructing 3 mm calculus within the distal left ureter just proximal to the ureteral anastomosis."  He was given Rocephin, transfer from [facility] to [facility] for urologic consultation and possible nephrostomy tube placement Per urology Dr. Jeffie Pollock, no plan for nephrostomy tube today as IR doesn't feel there is significant hydronephrosis which would make NT placement difficult. , will repeat renal ultrasound in the morning, further plan depending on clinical stability and repeat renal ultrasound, will keep n.p.o. after midnight in case intervention needed on Monday.  Obtain blood culture and urine culture, start Rocephin 2 g to cover possible pyelonephritis  CT scan also showed "Severe sigmoid diverticulosis without superimposed acute inflammatory change." He does reports intermittent LLQ ab pain, already on rocephin, add flagyl, reports chronic constipation, start stool softener.  Bp low normal, decrease home bp meds with holding parameters.  FTT, patient agreed to PT eval  Wife updated at bedside

## 2022-03-21 NOTE — ED Notes (Signed)
Pt dentures placed in pink denture box with water, labled with pt sticker and placed on bedside table

## 2022-03-22 DIAGNOSIS — I10 Essential (primary) hypertension: Secondary | ICD-10-CM | POA: Diagnosis not present

## 2022-03-22 DIAGNOSIS — F419 Anxiety disorder, unspecified: Secondary | ICD-10-CM | POA: Diagnosis not present

## 2022-03-22 DIAGNOSIS — N132 Hydronephrosis with renal and ureteral calculous obstruction: Secondary | ICD-10-CM | POA: Diagnosis not present

## 2022-03-22 DIAGNOSIS — R739 Hyperglycemia, unspecified: Secondary | ICD-10-CM | POA: Diagnosis not present

## 2022-03-22 LAB — CBC WITH DIFFERENTIAL/PLATELET
Abs Immature Granulocytes: 0.06 10*3/uL (ref 0.00–0.07)
Basophils Absolute: 0 10*3/uL (ref 0.0–0.1)
Basophils Relative: 0 %
Eosinophils Absolute: 0.2 10*3/uL (ref 0.0–0.5)
Eosinophils Relative: 2 %
HCT: 36.6 % — ABNORMAL LOW (ref 39.0–52.0)
Hemoglobin: 11.9 g/dL — ABNORMAL LOW (ref 13.0–17.0)
Immature Granulocytes: 1 %
Lymphocytes Relative: 6 %
Lymphs Abs: 0.6 10*3/uL — ABNORMAL LOW (ref 0.7–4.0)
MCH: 31.2 pg (ref 26.0–34.0)
MCHC: 32.5 g/dL (ref 30.0–36.0)
MCV: 95.8 fL (ref 80.0–100.0)
Monocytes Absolute: 0.8 10*3/uL (ref 0.1–1.0)
Monocytes Relative: 8 %
Neutro Abs: 8.5 10*3/uL — ABNORMAL HIGH (ref 1.7–7.7)
Neutrophils Relative %: 83 %
Platelets: 140 10*3/uL — ABNORMAL LOW (ref 150–400)
RBC: 3.82 MIL/uL — ABNORMAL LOW (ref 4.22–5.81)
RDW: 13.2 % (ref 11.5–15.5)
WBC: 10.2 10*3/uL (ref 4.0–10.5)
nRBC: 0 % (ref 0.0–0.2)

## 2022-03-22 LAB — BASIC METABOLIC PANEL
Anion gap: 6 (ref 5–15)
BUN: 30 mg/dL — ABNORMAL HIGH (ref 8–23)
CO2: 23 mmol/L (ref 22–32)
Calcium: 8.1 mg/dL — ABNORMAL LOW (ref 8.9–10.3)
Chloride: 105 mmol/L (ref 98–111)
Creatinine, Ser: 1.62 mg/dL — ABNORMAL HIGH (ref 0.61–1.24)
GFR, Estimated: 42 mL/min — ABNORMAL LOW (ref >60)
Glucose, Bld: 116 mg/dL — ABNORMAL HIGH (ref 70–99)
Potassium: 4.1 mmol/L (ref 3.5–5.1)
Sodium: 134 mmol/L — ABNORMAL LOW (ref 135–145)

## 2022-03-22 LAB — MAGNESIUM: Magnesium: 1.9 mg/dL (ref 1.7–2.4)

## 2022-03-22 LAB — URINE CULTURE

## 2022-03-22 LAB — PHOSPHORUS: Phosphorus: 3 mg/dL (ref 2.5–4.6)

## 2022-03-22 MED ORDER — SODIUM CHLORIDE 0.9 % IV SOLN: INTRAVENOUS | Status: DC

## 2022-03-22 MED ORDER — HYDRALAZINE HCL 20 MG/ML IJ SOLN: 10.0000 mg | Freq: Four times a day (QID) | INTRAMUSCULAR | Status: DC | PRN

## 2022-03-22 MED ORDER — TAMSULOSIN HCL 0.4 MG PO CAPS
0.4000 mg | ORAL_CAPSULE | Freq: Every day | ORAL | Status: DC
  Administered 2022-03-22 – 2022-03-23 (×2): 0.4 mg via ORAL
  Filled 2022-03-22 (×2): qty 1

## 2022-03-22 NOTE — Progress Notes (Signed)
  Transition of Care [facility]) Screening Note   Patient Details  Name: Benjamin Yang Date of Birth: 1935-07-23   Transition of Care [facility]) CM/SW Contact:    Lennart Pall, LCSW Phone Number: 03/22/2022, 9:20 AM    Transition of Care Department [facility]) has reviewed patient and no TOC needs have been identified at this time. We will continue to monitor patient advancement through interdisciplinary progression rounds. If new patient transition needs arise, please place a TOC consult.

## 2022-03-22 NOTE — Plan of Care (Signed)

## 2022-03-22 NOTE — Progress Notes (Signed)
Mobility Specialist - Progress Note   03/22/22 1350  Mobility  Activity Transferred from chair to bed  Level of Assistance Modified independent, requires aide device or extra time  Assistive Device None  Distance Ambulated (ft) 5 ft  Activity Response Tolerated well  Mobility Referral Yes  $Mobility charge 1 Mobility   Pt received in recliner & requesting assistance to bed. Pt declined ambulation but stated he will try tomorrow morning. C/o feeling tired & weak after sitting in recliner. No other complaints during session. Pt to bed after session with all needs met & call bell in reach.   [facility]

## 2022-03-22 NOTE — Progress Notes (Signed)
Subjective/Chief Complaint:   1 - Metastatic Bladder Cancer - s/p robotic cystoprostatectomy / ICG sentinal + template nodes / ileal conduit 09/25/12 for [id number] high-grade urothelial carcinoma. Rt sentinel ext. iliac and peri-bladder nodes positive. Margins negative, though some atypia rt ureteral distal. Orriginal CT and bone scan with only tiny focus of rib uptake not noted on CT. Refused adjuvant / neoadjuvant chemo.    Recent Post-op Surveillance:  03/2017: CMP, CXR, CT no evidence of disease.  03/2022: CMP, CT - Cr 1.1, no recurrence, Small Lt ureteral stone  2 - Non-complex Left Renal Cysts - <2.5cm left upper non-complex cyst x 2 on imaging x several, stable on imaging above w/o enhancing nodules / coarse calcifications.   3 - Small Left Ureteral Stone, Mild Pyelo - 42m mid ureteral and punctate distal ureteral (too small to measure) on ER CT 03/2022 on eval flank pain and malaise. No delayed nephrogram, f/u UKoreawith stable / decreased veyr mild hydro. Placed on rocephin. UCX 03/20/22 - pending, no additioanl recnet prior CX data from our office system.    PMH sig for HTN, Back surgery in 1980s (no deficits). No CV disease. No blood thinners.    Today Benjamin Yang stable. Low grade fever that is trending down. UKoreayesterday w/o sig hydro. I haven't seen him in nearly 5 years. FBobbye Riggsamazing he remains w/o gross bladder cancer recurrence.   Objective: Vital signs in last 24 hours: Temp:  [97.3 F (36.3 C)-100.1 F (37.8 C)] 98.4 F (36.9 C) (01/22 1000) Pulse Rate:  [73-104] 73 (01/22 1100) Resp:  [16-20] 18 (01/22 1000) BP: (96-127)/(57-77) 96/57 (01/22 1000) SpO2:  [94 %-97 %] 94 % (01/22 1000) Last BM Date : 03/19/22  Intake/Output from previous day: 01/21 0701 - 01/22 0700 In: 929.9 [P.O.:630; IV Piggyback:299.9] Out: 425 [Urine:425] Intake/Output this shift: Total I/O In: -  Out: 350 [Urine:350]  NAD. Frail but pleasant. Wife at bedside.  Non-labored breathing Mild  tachycardia RLQ Uroatomy pink and patent of copious non-foul urine, scant mucus Prior scars w/o hernais.  No c/c/e  Lab Results:  Recent Labs    03/21/22 0406 03/22/22 0323  WBC 14.8* 10.2  HGB 13.7 11.9*  HCT 41.3 36.6*  PLT 189 140*   BMET Recent Labs    03/21/22 0406 03/22/22 0323  NA 137 134*  K 3.9 4.1  CL 105 105  CO2 23 23  GLUCOSE 145* 116*  BUN 18 30*  CREATININE 1.19 1.62*  CALCIUM 8.1* 8.1*   PT/INR No results for input(s): "LABPROT", "INR" in the last 72 hours. ABG No results for input(s): "PHART", "HCO3" in the last 72 hours.  Invalid input(s): "PCO2", "PO2"  Studies/Results: UKoreaRENAL  Result Date: 03/21/2022 CLINICAL DATA:  Hydronephrosis of left kidney. History of cystectomy and ileal conduit formation EXAM: RENAL / URINARY TRACT ULTRASOUND COMPLETE COMPARISON:  CT abdomen and pelvis 03/20/2022 FINDINGS: This study is technically limited due to patient large body habitus and bowel gas. Right Kidney: Renal measurements: 10.3 x 5.5 x 4.9 cm = volume: 146 mL. Echogenicity within normal limits. No mass or hydronephrosis visualized. Left Kidney: Renal measurements: 11.9 x 5.2 x 5.0 = volume: 161 mL. Echogenicity within normal limits. Hypoechoic lesion within the upper pole of the right kidney measuring up to 1.1 cm corresponding to the fluid density cyst on yesterday's CT. No follow-up imaging recommended. The mild hydronephrosis seen on yesterday's CT is difficult to visualize. Bladder: Surgically absent. Other: None. IMPRESSION: 1. This study is  technically limited due to patient large body habitus and bowel gas. 2. The mild left hydronephrosis seen on yesterday's CT is difficult to visualize. Electronically Signed   By: Yvonne Kendall M.D.   On: 03/21/2022 12:16   CT Abdomen Pelvis W Contrast  Result Date: 03/20/2022 CLINICAL DATA:  Left lower quadrant abdominal pain EXAM: CT ABDOMEN AND PELVIS WITH CONTRAST TECHNIQUE: Multidetector CT imaging of the abdomen and  pelvis was performed using the standard protocol following bolus administration of intravenous contrast. RADIATION DOSE REDUCTION: This exam was performed according to the departmental dose-optimization program which includes automated exposure control, adjustment of the mA and/or kV according to patient size and/or use of iterative reconstruction technique. CONTRAST:  118m OMNIPAQUE IOHEXOL 300 MG/ML  SOLN COMPARISON:  12/30/2020 FINDINGS: Lower chest: Extensive multi-vessel coronary artery calcification. Global cardiac size within normal limits. No pericardial effusion. Stable bibasilar pulmonary parenchymal scarring. Moderate hiatal hernia. Hepatobiliary: No focal liver abnormality is seen. Interval cholecystectomy. No biliary dilatation. Pancreas: Unremarkable Spleen: Unremarkable Adrenals/Urinary Tract: The adrenal glands are unremarkable. The kidneys are normal in size and position. Multiple simple cortical cysts are seen within the kidneys bilaterally. No follow-up imaging is recommended for these lesions. There is mild left hydronephrosis and moderate left asymmetric perinephric stranding secondary to an obstructing 3 mm calculus within the mid left ureter at axial image # 75/2. An additional 3 mm calculus is seen within the distal left ureter just proximal to the ureteral anastomosis. No additional renal or ureteral calculi. No hydronephrosis on the right. Cystectomy and ileal conduit formation has been performed with the urostomy noted within the right mid abdomen. Small associated fat containing parastomal hernia. 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 Severe sigmoid diverticulosis without superimposed acute inflammatory change. The stomach, small bowel, and large bowel are otherwise unremarkable. Appendix absent. No free intraperitoneal gas or fluid. Vascular/Lymphatic: Moderate aortoiliac atherosclerotic calcification. No aortic aneurysm. No pathologic  adenopathy within the abdomen and pelvis. Reproductive: Status post prostatectomy. Other: Fat containing left parasagittal moderate supraumbilical and small infraumbilical ventral hernias are identified. Musculoskeletal: No acute bone abnormality. No lytic or blastic bone lesion. IMPRESSION: 1. Obstructing 3 mm calculus within the mid left ureter resulting in mild left hydronephrosis and moderate left perinephric stranding. Superimposed nonobstructing 3 mm calculus within the distal left ureter just proximal to the ureteral anastomosis. 2. Extensive multi-vessel coronary artery calcification. 3. Moderate hiatal hernia. 4. Severe sigmoid diverticulosis without superimposed acute inflammatory change. 5. Status post cystoprostatectomy and ileal conduit formation with urostomy noted within the right mid abdomen. Small associated fat containing parastomal hernia. 6. Fat containing left parasagittal moderate supraumbilical and small infraumbilical ventral hernias. Aortic Atherosclerosis (ICD10-I70.0). Electronically Signed   By: AFidela SalisburyM.D.   On: 03/20/2022 20:39    Anti-infectives: Anti-infectives (From admission, onward)    Start     Dose/Rate Route Frequency Ordered Stop   03/21/22 1500  metroNIDAZOLE (FLAGYL) IVPB 500 mg        500 mg 100 mL/hr over 60 Minutes Intravenous Every 12 hours 03/21/22 1259     03/21/22 1200  cefTRIAXone (ROCEPHIN) 2 g in sodium chloride 0.9 % 100 mL IVPB       Note to Pharmacy: please start abx after blood culture and urine culture obtained   2 g 200 mL/hr over 30 Minutes Intravenous Every 24 hours 03/21/22 0924     03/20/22 2245  cefTRIAXone (ROCEPHIN) 1 g in sodium chloride 0.9 % 100 mL  IVPB        1 g 200 mL/hr over 30 Minutes Intravenous  Once 03/20/22 2242 03/21/22 0003       Assessment/Plan:  Pt's infectious parameters continue to improve, no compelling indication for neph tube / additional GU drainage. Likely >60% chance medical stone passage. I will add  tamsulosin as does have some effect on ureteral smooth muscle. Should infectious parameters worsen, then consider left neph tube.    Agree with current rocephin pening additional CX-specific data.  Greatly appreciate hospitaliast team comanagement.   He was congratulated on being free of grossly recurrence cancer 10 years s/p cystectomy for oligometastatic disease. This is frankly amazing.   I feel pt OK for DC home when afebrile x 24 hours.   Alexis Frock 03/22/2022

## 2022-03-22 NOTE — Plan of Care (Signed)
  Problem: Education: Goal: Knowledge of General Education information will improve Description: Including pain rating scale, medication(s)/side effects and non-pharmacologic comfort measures Outcome: Progressing   Problem: Activity: Goal: Risk for activity intolerance will decrease Outcome: Progressing   Problem: Coping: Goal: Level of anxiety will decrease Outcome: Progressing   

## 2022-03-22 NOTE — Progress Notes (Addendum)
PROGRESS NOTE    Benjamin Yang  CWC:[id number] DOB: 1935/10/21 DOA: 03/20/2022 PCP: Monico Blitz, MD    Brief Narrative:  Benjamin Yang is a 87 y.o. male with medical history significant of bladder cancer status post robotic cystoprostatectomy with node dissection and ileal conduit, hypertension, anxiety, presented to hospital with nausea back pain more on the flank for 2 days with generalized weakness and malaise.  In the ED, he was noted to have obstructing 3 mm calculus in the mid left ureter causing mild left hydronephrosis and moderate left perinephric stranding.  Patient was then transferred from [facility] to [facility] per urology consultation.  Blood cultures including urine culture was sent and patient was started on IV Rocephin.  Assessment and plan.  Left-sided mild hydronephrosis with obstructing calculus with concern for pyelonephritis. Follow urine cultures, pending.  Blood cultures negative in less than 12 hours.  Continue IV Rocephin.  Urology has seen the patient and initially recommended percutaneous nephrostomy by IR but because of mild in nature could not be done.  Has been treated conservatively.  Ultrasound done on 03/21/2022 had a technically poor study and difficult to visualize hydronephrosis.  Blood cultures negative in less than 24 hours.  Leukocytosis has improved.  Patient had previous history of Proteus  UTI.  Will follow urology recommendations.   Hyperglycemia - No official documented history of diabetes mellitus.  Will continue to monitor closely.  Could be reactive.  Hemoglobin A1c of 6.1.   Anxiety Continue Ativan.   Essential hypertension Hold ARB.  Elevated creatinine likely mild AKI. Baseline creatinine of 0.9-1.0.  Creatinine elevated at 1 from 1.6.  Continue with IV hydration.  Will renew IV fluids for 1 more day and reassess in AM.  Hold ARB.     DVT prophylaxis: heparin injection 5,000 Units Start: 03/20/22 2330 SCDs  Start: 03/20/22 2317   Code Status:     Code Status: DNR  Disposition: Home likely in 1 to 2 days  Status is: Inpatient  Remains inpatient appropriate because: IV antibiotic, sepsis, obstructive uropathy,   Family Communication: Spoke with the patient's wife at bedside  Consultants:  Urology  Procedures:  None yet  Antimicrobials:  Rocephin and Metro  Anti-infectives (From admission, onward)    Start     Dose/Rate Route Frequency Ordered Stop   03/21/22 1500  metroNIDAZOLE (FLAGYL) IVPB 500 mg        500 mg 100 mL/hr over 60 Minutes Intravenous Every 12 hours 03/21/22 1259     03/21/22 1200  cefTRIAXone (ROCEPHIN) 2 g in sodium chloride 0.9 % 100 mL IVPB       Note to Pharmacy: please start abx after blood culture and urine culture obtained   2 g 200 mL/hr over 30 Minutes Intravenous Every 24 hours 03/21/22 0924     03/20/22 2245  cefTRIAXone (ROCEPHIN) 1 g in sodium chloride 0.9 % 100 mL IVPB        1 g 200 mL/hr over 30 Minutes Intravenous  Once 03/20/22 2242 03/21/22 0003      Subjective: Today, patient was seen and examined at bedside.  Patient states that he feels a little stronger than yesterday.  Still has low-grade fever and leukocytosis but improved.  Renal function still elevated.  Has intermittent lower abdominal pain but no nausea vomiting.  Objective: Vitals:   03/22/22 0457 03/22/22 0518 03/22/22 1000 03/22/22 1100  BP:  118/70 (!) 96/57   Pulse:  (!) 104 73 73  Resp:  16 18   Temp: 99.1 F (37.3 C) (!) 97.3 F (36.3 C) 98.4 F (36.9 C)   TempSrc: Oral Oral Oral   SpO2:  96% 94%   Weight:      Height:        Intake/Output Summary (Last 24 hours) at 03/22/2022 1304 Last data filed at 03/22/2022 1112 Gross per 24 hour  Intake 809.94 ml  Output 600 ml  Net 209.94 ml   Filed Weights   03/20/22 1819  Weight: 88.5 kg    Physical Examination: Body mass index is 25.05 kg/m.  General:  Average built, not in obvious distress HENT:   No  scleral pallor or icterus noted. Oral mucosa is moist.  Chest:  Clear breath sounds.  . No crackles or wheezes.  CVS: S1 &S2 heard. No murmur.  Regular rate and rhythm. Abdomen: Soft, nontender, nondistended.  Bowel sounds are heard.  Right lower quadrant ileal conduit in place.  Mild left lower quadrant nonspecific tenderness on palpation. Extremities: No cyanosis, clubbing or edema.  Peripheral pulses are palpable. Psych: Alert, awake and oriented, normal mood CNS:  No cranial nerve deficits.  Power equal in all extremities.   Skin: Warm and dry.  No rashes noted.  Data Reviewed:   CBC: Recent Labs  Lab 03/20/22 1937 03/21/22 0406 03/22/22 0323  WBC 16.0* 14.8* 10.2  NEUTROABS 14.3* 13.4* 8.5*  HGB 14.8 13.7 11.9*  HCT 44.4 41.3 36.6*  MCV 95.9 94.5 95.8  PLT 186 189 140*    Basic Metabolic Panel: Recent Labs  Lab 03/20/22 1911 03/21/22 0406 03/22/22 0323  NA 133* 137 134*  K 4.2 3.9 4.1  CL 102 105 105  CO2 21* 23 23  GLUCOSE 198* 145* 116*  BUN 17 18 30*  CREATININE 1.13 1.19 1.62*  CALCIUM 8.5* 8.1* 8.1*  MG  --  1.7 1.9  PHOS  --   --  3.0    Liver Function Tests: Recent Labs  Lab 03/20/22 1911 03/21/22 0406  AST 22 20  ALT 21 17  ALKPHOS 60 54  BILITOT 1.0 0.8  PROT 6.7 6.3*  ALBUMIN 3.6 3.2*     Radiology Studies: US RENAL  Result Date: 03/21/2022 CLINICAL DATA:  Hydronephrosis of left kidney. History of cystectomy and ileal conduit formation EXAM: RENAL / URINARY TRACT ULTRASOUND COMPLETE COMPARISON:  CT abdomen and pelvis 03/20/2022 FINDINGS: This study is technically limited due to patient large body habitus and bowel gas. Right Kidney: Renal measurements: 10.3 x 5.5 x 4.9 cm = volume: 146 mL. Echogenicity within normal limits. No mass or hydronephrosis visualized. Left Kidney: Renal measurements: 11.9 x 5.2 x 5.0 = volume: 161 mL. Echogenicity within normal limits. Hypoechoic lesion within the upper pole of the right kidney measuring up to 1.1  cm corresponding to the fluid density cyst on yesterday's CT. No follow-up imaging recommended. The mild hydronephrosis seen on yesterday's CT is difficult to visualize. Bladder: Surgically absent. Other: None. IMPRESSION: 1. This study is technically limited due to patient large body habitus and bowel gas. 2. The mild left hydronephrosis seen on yesterday's CT is difficult to visualize. Electronically Signed   By: Yvonne Kendall M.D.   On: 03/21/2022 12:16   CT Abdomen Pelvis W Contrast  Result Date: 03/20/2022 CLINICAL DATA:  Left lower quadrant abdominal pain EXAM: CT ABDOMEN AND PELVIS WITH CONTRAST TECHNIQUE: Multidetector CT imaging of the abdomen and pelvis was performed using the standard protocol following bolus administration of intravenous contrast.  RADIATION DOSE REDUCTION: This exam was performed according to the departmental dose-optimization program which includes automated exposure control, adjustment of the mA and/or kV according to patient size and/or use of iterative reconstruction technique. CONTRAST:  184m OMNIPAQUE IOHEXOL 300 MG/ML  SOLN COMPARISON:  12/30/2020 FINDINGS: Lower chest: Extensive multi-vessel coronary artery calcification. Global cardiac size within normal limits. No pericardial effusion. Stable bibasilar pulmonary parenchymal scarring. Moderate hiatal hernia. Hepatobiliary: No focal liver abnormality is seen. Interval cholecystectomy. No biliary dilatation. Pancreas: Unremarkable Spleen: Unremarkable Adrenals/Urinary Tract: The adrenal glands are unremarkable. The kidneys are normal in size and position. Multiple simple cortical cysts are seen within the kidneys bilaterally. No follow-up imaging is recommended for these lesions. There is mild left hydronephrosis and moderate left asymmetric perinephric stranding secondary to an obstructing 3 mm calculus within the mid left ureter at axial image # 75/2. An additional 3 mm calculus is seen within the distal left ureter just  proximal to the ureteral anastomosis. No additional renal or ureteral calculi. No hydronephrosis on the right. Cystectomy and ileal conduit formation has been performed with the urostomy noted within the right mid abdomen. Small associated fat containing parastomal hernia. 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 Severe sigmoid diverticulosis without superimposed acute inflammatory change. The stomach, small bowel, and large bowel are otherwise unremarkable. Appendix absent. No free intraperitoneal gas or fluid. Vascular/Lymphatic: Moderate aortoiliac atherosclerotic calcification. No aortic aneurysm. No pathologic adenopathy within the abdomen and pelvis. Reproductive: Status post prostatectomy. Other: Fat containing left parasagittal moderate supraumbilical and small infraumbilical ventral hernias are identified. Musculoskeletal: No acute bone abnormality. No lytic or blastic bone lesion. IMPRESSION: 1. Obstructing 3 mm calculus within the mid left ureter resulting in mild left hydronephrosis and moderate left perinephric stranding. Superimposed nonobstructing 3 mm calculus within the distal left ureter just proximal to the ureteral anastomosis. 2. Extensive multi-vessel coronary artery calcification. 3. Moderate hiatal hernia. 4. Severe sigmoid diverticulosis without superimposed acute inflammatory change. 5. Status post cystoprostatectomy and ileal conduit formation with urostomy noted within the right mid abdomen. Small associated fat containing parastomal hernia. 6. Fat containing left parasagittal moderate supraumbilical and small infraumbilical ventral hernias. Aortic Atherosclerosis (ICD10-I70.0). Electronically Signed   By: AFidela SalisburyM.D.   On: 03/20/2022 20:39      LOS: 2 days    LFlora Lipps MD [facility] Available via Epic secure chat 7am-7pm After these hours, please refer to coverage provider listed on [email or web address] 03/22/2022, 1:04 PM

## 2022-03-23 DIAGNOSIS — F419 Anxiety disorder, unspecified: Secondary | ICD-10-CM | POA: Diagnosis not present

## 2022-03-23 DIAGNOSIS — I10 Essential (primary) hypertension: Secondary | ICD-10-CM | POA: Diagnosis not present

## 2022-03-23 DIAGNOSIS — R739 Hyperglycemia, unspecified: Secondary | ICD-10-CM | POA: Diagnosis not present

## 2022-03-23 DIAGNOSIS — N132 Hydronephrosis with renal and ureteral calculous obstruction: Secondary | ICD-10-CM | POA: Diagnosis not present

## 2022-03-23 LAB — URINE CULTURE: Culture: >=100000 — AB

## 2022-03-23 MED ORDER — CEFDINIR 300 MG PO CAPS: 300.0000 mg | ORAL_CAPSULE | Freq: Two times a day (BID) | ORAL | 0 refills | Status: AC

## 2022-03-23 MED ORDER — TAMSULOSIN HCL 0.4 MG PO CAPS: 0.4000 mg | ORAL_CAPSULE | Freq: Every day | ORAL | 0 refills | Status: AC

## 2022-03-23 NOTE — Discharge Summary (Signed)
Physician Discharge Summary  Benjamin Yang MWN:[id number] DOB: 1935/06/11 DOA: 03/20/2022  PCP: Monico Blitz, MD  Admit date: 03/20/2022 Discharge date: 03/23/2022  Admitted From: Home  Discharge disposition: Home   Recommendations for Outpatient Follow-Up:   Follow up with your primary care provider in one week.  Check CBC, BMP, magnesium in the next visit Follow-up with urology as outpatient in 2 to 3 weeks. Patient has hemoglobin A1c in the prediabetic range.  Please closely monitor as outpatient.   Discharge Diagnosis:   Principal Problem:   Hydronephrosis with obstructing calculus Active Problems:   UTI (urinary tract infection)   Essential hypertension   Anxiety   Hyperglycemia    Discharge Condition: Improved.  Diet recommendation: Low sodium, heart healthy.  Wound care: None.  Code status: DNR   History of Present Illness:   Benjamin Yang is a 87 y.o. male with medical history significant of bladder cancer status post robotic cystoprostatectomy with node dissection and ileal conduit, hypertension, anxiety, presented to hospital with nausea back pain more on the flank for 2 days with generalized weakness and malaise.  In the ED, he was noted to have obstructing 3 mm calculus in the mid left ureter causing mild left hydronephrosis and moderate left perinephric stranding.  Patient was then transferred [facility] per urology consultation.  Blood cultures including urine culture was sent and patient was started on IV Rocephin.  Patient was then admitted to the hospital for further evaluation and treatment.  Hospital Course:   Following conditions were addressed during hospitalization as listed below,  Left-sided mild hydronephrosis with obstructing calculus with concern for pyelonephritis.   Blood cultures was negative in 2 days.  Urine culture with multiple species.  Urine culture however from 03/20/2022 with  gram-negative rods.  Patient received IV  Rocephin during hospitalization and urology followed the patient.  Ultrasound done on 03/21/2022 had a technically poor study and difficult to visualize hydronephrosis.  Leukocytosis has improved.  Patient is clinically improved as well.  Has low-grade fever only.  Urology has seen the patient and at this time patient has been considered for disposition home with outpatient urology follow-up.  Patient will continue Omnicef on discharge to complete total of 7-day course of antibiotic.  Tamsulosin has been added to the regimen by urology.  Will continue on discharge.   Hyperglycemia, likely prediabetes.  Hemoglobin A1c of 6.1.  Dietary modification advised.  Follow-up with PCP as outpatient.   Anxiety Continue Ativan from home..   Essential hypertension On amlodipine olmesartan as outpatient.  Will resume on discharge.   Elevated creatinine likely mild AKI. Baseline creatinine of 0.9-1.0.  Creatinine elevated at 1.6 on presentation.  Patient received IV fluids and ARB was temporarily on hold.  Creatinine today at 1.1 and at baseline.  Will be resumed on ARB on discharge.  Advised against NSAIDs.  Check BMP in the next visit.  Disposition.  At this time, patient is stable for disposition home with outpatient PCP and urology follow-up.  Medical Consultants:   Urology  Procedures:    None Subjective:   Today, patient was seen and examined at bedside.  Continues to feel better.  Denies any nausea vomiting abdominal pain fever chills or rigor.  Wants to go home.  Discharge Exam:   Vitals:   03/22/22 2121 03/23/22 0555  BP: 130/74 (!) 108/56  Pulse: 85 78  Resp: 18 16  Temp: 99.2 F (37.3 C) 98 F (36.7  C)  SpO2: 96% 94%   Vitals:   03/22/22 1000 03/22/22 1100 03/22/22 2121 03/23/22 0555  BP: (!) 96/57  130/74 (!) 108/56  Pulse: 73 73 85 78  Resp: '18  18 16  '$ Temp: 98.4 F (36.9 C)  99.2 F (37.3 C) 98 F (36.7 C)  TempSrc: Oral  Oral  Oral  SpO2: 94%  96% 94%  Weight:      Height:       General: Alert awake, not in obvious distress HENT: pupils equally reacting to light,  No scleral pallor or icterus noted. Oral mucosa is moist.  Chest:  Clear breath sounds.  Diminished breath sounds bilaterally. No crackles or wheezes.  CVS: S1 &S2 heard. No murmur.  Regular rate and rhythm. Abdomen: Soft, nontender, nondistended.  Bowel sounds are heard.  Right lower quadrant ileal conduit in place.  No costovertebral angle tenderness Extremities: No cyanosis, clubbing or edema.  Peripheral pulses are palpable. Psych: Alert, awake and oriented, normal mood CNS:  No cranial nerve deficits.  Power equal in all extremities.   Skin: Warm and dry.  No rashes noted.  The results of significant diagnostics from this hospitalization (including imaging, microbiology, ancillary and laboratory) are listed below for reference.     Diagnostic Studies:   US RENAL  Result Date: 03/21/2022 CLINICAL DATA:  Hydronephrosis of left kidney. History of cystectomy and ileal conduit formation EXAM: RENAL / URINARY TRACT ULTRASOUND COMPLETE COMPARISON:  CT abdomen and pelvis 03/20/2022 FINDINGS: This study is technically limited due to patient large body habitus and bowel gas. Right Kidney: Renal measurements: 10.3 x 5.5 x 4.9 cm = volume: 146 mL. Echogenicity within normal limits. No mass or hydronephrosis visualized. Left Kidney: Renal measurements: 11.9 x 5.2 x 5.0 = volume: 161 mL. Echogenicity within normal limits. Hypoechoic lesion within the upper pole of the right kidney measuring up to 1.1 cm corresponding to the fluid density cyst on yesterday's CT. No follow-up imaging recommended. The mild hydronephrosis seen on yesterday's CT is difficult to visualize. Bladder: Surgically absent. Other: None. IMPRESSION: 1. This study is technically limited due to patient large body habitus and bowel gas. 2. The mild left hydronephrosis seen on yesterday's CT is  difficult to visualize. Electronically Signed   By: Yvonne Kendall M.D.   On: 03/21/2022 12:16   CT Abdomen Pelvis W Contrast  Result Date: 03/20/2022 CLINICAL DATA:  Left lower quadrant abdominal pain EXAM: CT ABDOMEN AND PELVIS WITH CONTRAST TECHNIQUE: Multidetector CT imaging of the abdomen and pelvis was performed using the standard protocol following bolus administration of intravenous contrast. RADIATION DOSE REDUCTION: This exam was performed according to the departmental dose-optimization program which includes automated exposure control, adjustment of the mA and/or kV according to patient size and/or use of iterative reconstruction technique. CONTRAST:  154m OMNIPAQUE IOHEXOL 300 MG/ML  SOLN COMPARISON:  12/30/2020 FINDINGS: Lower chest: Extensive multi-vessel coronary artery calcification. Global cardiac size within normal limits. No pericardial effusion. Stable bibasilar pulmonary parenchymal scarring. Moderate hiatal hernia. Hepatobiliary: No focal liver abnormality is seen. Interval cholecystectomy. No biliary dilatation. Pancreas: Unremarkable Spleen: Unremarkable Adrenals/Urinary Tract: The adrenal glands are unremarkable. The kidneys are normal in size and position. Multiple simple cortical cysts are seen within the kidneys bilaterally. No follow-up imaging is recommended for these lesions. There is mild left hydronephrosis and moderate left asymmetric perinephric stranding secondary to an obstructing 3 mm calculus within the mid left ureter at axial image # 75/2. An additional 3 mm calculus is seen  within the distal left ureter just proximal to the ureteral anastomosis. No additional renal or ureteral calculi. No hydronephrosis on the right. Cystectomy and ileal conduit formation has been performed with the urostomy noted within the right mid abdomen. Small associated fat containing parastomal hernia. 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 Severe  sigmoid diverticulosis without superimposed acute inflammatory change. The stomach, small bowel, and large bowel are otherwise unremarkable. Appendix absent. No free intraperitoneal gas or fluid. Vascular/Lymphatic: Moderate aortoiliac atherosclerotic calcification. No aortic aneurysm. No pathologic adenopathy within the abdomen and pelvis. Reproductive: Status post prostatectomy. Other: Fat containing left parasagittal moderate supraumbilical and small infraumbilical ventral hernias are identified. Musculoskeletal: No acute bone abnormality. No lytic or blastic bone lesion. IMPRESSION: 1. Obstructing 3 mm calculus within the mid left ureter resulting in mild left hydronephrosis and moderate left perinephric stranding. Superimposed nonobstructing 3 mm calculus within the distal left ureter just proximal to the ureteral anastomosis. 2. Extensive multi-vessel coronary artery calcification. 3. Moderate hiatal hernia. 4. Severe sigmoid diverticulosis without superimposed acute inflammatory change. 5. Status post cystoprostatectomy and ileal conduit formation with urostomy noted within the right mid abdomen. Small associated fat containing parastomal hernia. 6. Fat containing left parasagittal moderate supraumbilical and small infraumbilical ventral hernias. Aortic Atherosclerosis (ICD10-I70.0). Electronically Signed   By: Fidela Salisbury M.D.   On: 03/20/2022 20:39     Labs:   Basic Metabolic Panel: Recent Labs  Lab 03/20/22 1911 03/21/22 0406 03/22/22 0323  NA 133* 137 134*  K 4.2 3.9 4.1  CL 102 105 105  CO2 21* 23 23  GLUCOSE 198* 145* 116*  BUN 17 18 30*  CREATININE 1.13 1.19 1.62*  CALCIUM 8.5* 8.1* 8.1*  MG  --  1.7 1.9  PHOS  --   --  3.0   GFR Estimated Creatinine Clearance: 39.5 mL/min (A) (by C-G formula based on SCr of 1.62 mg/dL (H)). Liver Function Tests: Recent Labs  Lab 03/20/22 1911 03/21/22 0406  AST 22 20  ALT 21 17  ALKPHOS 60 54  BILITOT 1.0 0.8  PROT 6.7 6.3*   ALBUMIN 3.6 3.2*   Recent Labs  Lab 03/20/22 1911  LIPASE 25   No results for input(s): "AMMONIA" in the last 168 hours. Coagulation profile No results for input(s): "INR", "PROTIME" in the last 168 hours.  CBC: Recent Labs  Lab 03/20/22 1937 03/21/22 0406 03/22/22 0323  WBC 16.0* 14.8* 10.2  NEUTROABS 14.3* 13.4* 8.5*  HGB 14.8 13.7 11.9*  HCT 44.4 41.3 36.6*  MCV 95.9 94.5 95.8  PLT 186 189 140*   Cardiac Enzymes: No results for input(s): "CKTOTAL", "CKMB", "CKMBINDEX", "TROPONINI" in the last 168 hours. BNP: Invalid input(s): "POCBNP" CBG: No results for input(s): "GLUCAP" in the last 168 hours. D-Dimer No results for input(s): "DDIMER" in the last 72 hours. Hgb A1c Recent Labs    03/21/22 0406  HGBA1C 6.1*   Lipid Profile No results for input(s): "CHOL", "HDL", "LDLCALC", "TRIG", "CHOLHDL", "LDLDIRECT" in the last 72 hours. Thyroid function studies No results for input(s): "TSH", "T4TOTAL", "T3FREE", "THYROIDAB" in the last 72 hours.  Invalid input(s): "FREET3" Anemia work up No results for input(s): "VITAMINB12", "FOLATE", "FERRITIN", "TIBC", "IRON", "RETICCTPCT" in the last 72 hours. Microbiology Recent Results (from the past 240 hour(s))  Resp panel by RT-PCR (RSV, Flu A&B, Covid) Anterior Nasal Swab     Status: None   Collection Time: 03/20/22  7:02 PM   Specimen: Anterior Nasal Swab  Result  Value Ref Range Status   SARS Coronavirus 2 by RT PCR NEGATIVE NEGATIVE Final    Comment: (NOTE) SARS-CoV-2 target nucleic acids are NOT DETECTED.  The SARS-CoV-2 RNA is generally detectable in upper respiratory specimens during the acute phase of infection. The lowest concentration of SARS-CoV-2 viral copies this assay can detect is 138 copies/mL. A negative result does not preclude SARS-Cov-2 infection and should not be used as the sole basis for treatment or other patient management decisions. A negative result may occur with  improper specimen  collection/handling, submission of specimen other than nasopharyngeal swab, presence of viral mutation(s) within the areas targeted by this assay, and inadequate number of viral copies(<138 copies/mL). A negative result must be combined with clinical observations, patient history, and epidemiological information. The expected result is Negative.  Fact Sheet for Patients:  EntrepreneurPulse.com.au  Fact Sheet for Healthcare Providers:  IncredibleEmployment.be  This test is no t yet approved or cleared by the [place] FDA and  has been authorized for detection and/or diagnosis of SARS-CoV-2 by FDA under an Emergency Use Authorization (EUA). This EUA will remain  in effect (meaning this test can be used) for the duration of the COVID-19 declaration under Section 564(b)(1) of the Act, 21 U.S.C.section 360bbb-3(b)(1), unless the authorization is terminated  or revoked sooner.       Influenza A by PCR NEGATIVE NEGATIVE Final   Influenza B by PCR NEGATIVE NEGATIVE Final    Comment: (NOTE) The Xpert Xpress SARS-CoV-2/FLU/RSV plus assay is intended as an aid in the diagnosis of influenza from Nasopharyngeal swab specimens and should not be used as a sole basis for treatment. Nasal washings and aspirates are unacceptable for Xpert Xpress SARS-CoV-2/FLU/RSV testing.  Fact Sheet for Patients: EntrepreneurPulse.com.au  Fact Sheet for Healthcare Providers: IncredibleEmployment.be  This test is not yet approved or cleared by the [place] FDA and has been authorized for detection and/or diagnosis of SARS-CoV-2 by FDA under an Emergency Use Authorization (EUA). This EUA will remain in effect (meaning this test can be used) for the duration of the COVID-19 declaration under Section 564(b)(1) of the Act, 21 U.S.C. section 360bbb-3(b)(1), unless the authorization is terminated or revoked.     Resp Syncytial  Virus by PCR NEGATIVE NEGATIVE Final    Comment: (NOTE) Fact Sheet for Patients: EntrepreneurPulse.com.au  Fact Sheet for Healthcare Providers: IncredibleEmployment.be  This test is not yet approved or cleared by the [place] FDA and has been authorized for detection and/or diagnosis of SARS-CoV-2 by FDA under an Emergency Use Authorization (EUA). This EUA will remain in effect (meaning this test can be used) for the duration of the COVID-19 declaration under Section 564(b)(1) of the Act, 21 U.S.C. section 360bbb-3(b)(1), unless the authorization is terminated or revoked.  Performed at [facility], [address] [place], [place] [address]   Urine Culture     Status: Abnormal (Preliminary result)   Collection Time: 03/20/22  9:50 PM   Specimen: Urine, Clean Catch  Result Value Ref Range Status   Specimen Description   Final    URINE, CLEAN CATCH Performed at [facility], [address] [place], [place] [address]    Special Requests   Final    NONE Performed at [facility], [address], [place], [place] [address]    Culture (A)  Final    >=100,000 COLONIES/mL GRAM NEGATIVE RODS IDENTIFICATION AND SUSCEPTIBILITIES TO FOLLOW Performed at [facility], [place] [address] [place],  [address]    Report  Status PENDING  Incomplete  Urine Culture     Status: Abnormal   Collection Time: 03/21/22  9:40 AM   Specimen: Urine, Clean Catch  Result Value Ref Range Status   Specimen Description   Final    URINE, CLEAN CATCH Performed at [facility], [place] [address] [place], [place] [address]    Special Requests   Final    NONE Performed at [facility], [place] [address], [place], [place] [address]    Culture MULTIPLE SPECIES PRESENT, SUGGEST RECOLLECTION (A)  Final   Report Status 03/22/2022 FINAL  Final  Culture, blood (Routine X 2) w Reflex to ID Panel     Status: None (Preliminary result)    Collection Time: 03/21/22  9:47 AM   Specimen: BLOOD LEFT ARM  Result Value Ref Range Status   Specimen Description   Final    BLOOD LEFT ARM Performed at [facility], [place] [address] [place], [place] [address]    Special Requests   Final    AEROBIC BOTTLE ONLY Blood Culture adequate volume Performed at [place] [address], [place], [place] [address]    Culture   Final    NO GROWTH 2 DAYS Performed at [place] [address] [place], [place] [address]    Report Status PENDING  Incomplete  Culture, blood (Routine X 2) w Reflex to ID Panel     Status: None (Preliminary result)   Collection Time: 03/21/22  9:47 AM   Specimen: BLOOD RIGHT ARM  Result Value Ref Range Status   Specimen Description   Final    BLOOD RIGHT ARM Performed at [facility], [place] [address], [place], [place] [address]    Special Requests   Final    AEROBIC BOTTLE ONLY Blood Culture adequate volume Performed at [place] [address], [place], [place] [address]    Culture   Final    NO GROWTH 2 DAYS Performed at [place] [address] [place],  [address]    Report Status PENDING  Incomplete     Discharge Instructions:   Discharge Instructions     Call MD for:  persistant nausea and vomiting   Complete by: As directed    Call MD for:  severe uncontrolled pain   Complete by: As directed    Call MD for:  temperature >100.4   Complete by: As directed    Diet general   Complete by: As directed    Discharge instructions   Complete by: As directed    Increase fluid intake. Complete the course of antibiotics. Increase activity. Seek medical attention for worsening symptoms. Follow up with yoiur primary care provider in one week to check blood work. Follow up with Dr Tresa Moore urology in 2-3 weeks or as scheduled by the clinic.   Increase activity slowly   Complete by: As directed       Allergies as of  03/23/2022       Reactions   Percocet [oxycodone-acetaminophen]    Patient says he can take this for pain-just bothered his stomach a little bit before        Medication List     STOP taking these medications    telmisartan 80 MG tablet Commonly known as: MICARDIS       TAKE these medications    amLODipine 2.5 MG tablet Commonly known as: NORVASC Take 2.5 mg by mouth  daily.   cefdinir 300 MG capsule Commonly known as: OMNICEF Take 1 capsule (300 mg total) by mouth 2 (two) times daily for 5 days.   fluticasone 50 MCG/ACT nasal spray Commonly known as: FLONASE Place 2 sprays into both nostrils daily.   ibuprofen 200 MG tablet Commonly known as: ADVIL Take 600 mg by mouth every 6 (six) hours as needed for mild pain.   LORazepam 1 MG tablet Commonly known as: ATIVAN Take 1 mg by mouth every 8 (eight) hours as needed for anxiety. What changed: Another medication with the same name was removed. Continue taking this medication, and follow the directions you see here.   Magnesium 200 MG Tabs Take 200 mg by mouth 2 (two) times a week.   meclizine 25 MG tablet Commonly known as: ANTIVERT Take 25 mg by mouth 3 (three) times daily as needed for dizziness.   olmesartan 40 MG tablet Commonly known as: BENICAR Take 40 mg by mouth daily.   rosuvastatin 5 MG tablet Commonly known as: CRESTOR Take 5 mg by mouth once a week.   tamsulosin 0.4 MG Caps capsule Commonly known as: FLOMAX Take 1 capsule (0.4 mg total) by mouth daily.        Follow-up Information     Monico Blitz, MD Follow up.   Specialty: Internal Medicine Contact information: [place] [address] [phone]         Alexis Frock, MD Follow up in 2 week(s).   Specialty: Urology Contact information: [place] [address] [phone]                  Time coordinating discharge: 39 minutes  Signed:  Sakari Raisanen  [facility] 03/23/2022, 1:07  PM

## 2022-03-23 NOTE — Progress Notes (Signed)
Reviewed written d/c instructions w pt and his wife and all questions answered. They both verbalized understanding. D/C via w/c w all belongings in stable condition. 

## 2022-03-26 LAB — CULTURE, BLOOD (ROUTINE X 2)
Culture: NO GROWTH
Culture: NO GROWTH
Special Requests: ADEQUATE
Special Requests: ADEQUATE

## 2022-03-29 DIAGNOSIS — N200 Calculus of kidney: Secondary | ICD-10-CM | POA: Diagnosis not present

## 2022-03-29 DIAGNOSIS — E1165 Type 2 diabetes mellitus with hyperglycemia: Secondary | ICD-10-CM | POA: Diagnosis not present

## 2022-03-29 DIAGNOSIS — Z299 Encounter for prophylactic measures, unspecified: Secondary | ICD-10-CM | POA: Diagnosis not present

## 2022-03-29 DIAGNOSIS — I10 Essential (primary) hypertension: Secondary | ICD-10-CM | POA: Diagnosis not present

## 2022-03-30 ENCOUNTER — Ambulatory Visit (HOSPITAL_COMMUNITY): Payer: Medicare Other | Attending: Otolaryngology

## 2022-03-30 ENCOUNTER — Other Ambulatory Visit: Payer: Self-pay

## 2022-03-30 DIAGNOSIS — R262 Difficulty in walking, not elsewhere classified: Secondary | ICD-10-CM | POA: Insufficient documentation

## 2022-03-30 DIAGNOSIS — R42 Dizziness and giddiness: Secondary | ICD-10-CM | POA: Diagnosis not present

## 2022-03-30 NOTE — Therapy (Signed)
OUTPATIENT PHYSICAL THERAPY VESTIBULAR EVALUATION     Patient Name: Benjamin Yang MRN: [id number] DOB:12-17-1935, 87 y.o., male Today's Date: 03/30/2022  END OF SESSION:  PT End of Session - 03/30/22 1256     Visit Number 1    Number of Visits 8    Date for PT Re-Evaluation 04/27/22    Authorization Type Medicare    Progress Note Due on Visit 10    PT Start Time 1120    PT Stop Time 1200    PT Time Calculation (min) 40 min             Past Medical History:  Diagnosis Date   Cancer ([place])    bladder   Hypertension    Prostatitis    Past Surgical History:  Procedure Laterality Date   BACK SURGERY     CHOLECYSTECTOMY N/A 01/06/2021   Procedure: LAPAROSCOPIC CHOLECYSTECTOMY;  Surgeon: Aviva Signs, MD;  Location: [facility];  Service: General;  Laterality: N/A;   CYSTOSCOPY N/A 09/15/2012   Procedure: CYSTOSCOPY;  Surgeon: Alexis Frock, MD;  Location: [facility];  Service: Urology;  Laterality: N/A;   LYMPHADENECTOMY Bilateral 09/15/2012   Procedure:  Bilateral Pelvic Lymph Node Dissection;  Surgeon: Alexis Frock, MD;  Location: [facility];  Service: Urology;  Laterality: Bilateral;   ROBOT ASSISTED LAPAROSCOPIC COMPLETE CYSTECT ILEAL CONDUIT N/A 09/15/2012   Procedure: Robotic Cystoprostatectomy, Open Ileal Conduit Diversion,  Indocyanine Green Dye Injection into Bladder ;  Surgeon: Alexis Frock, MD;  Location: [facility];  Service: Urology;  Laterality: N/A;  Robotic Cystoprostatectomy, Bilateral Pelvic Lymph Node Dissection, Open Ileal Conduit Diversion, Cysto, Indocyanine Green Dye Injection into Bladder     ROBOTIC ASSISTED LAPAROSCOPIC LYSIS OF ADHESION N/A 09/15/2012   Procedure: ROBOTIC ASSISTED LAPAROSCOPIC LYSIS OF ADHESION;  Surgeon: Alexis Frock, MD;  Location: [facility];  Service: Urology;  Laterality: N/A;   Patient Active Problem List   Diagnosis Date Noted   Anxiety 03/21/2022   Hyperglycemia 03/21/2022   Hydronephrosis with obstructing calculus 03/20/2022    Empyema of gallbladder    Acute cholecystitis 01/02/2021   Severe sepsis ([place])    Sepsis ([place]) 04/13/2018   Lobar pneumonia ([place]) 04/13/2018   UTI (urinary tract infection) 04/13/2018   Essential hypertension 04/13/2018    PCP: Dr. Manuella Ghazi REFERRING PROVIDER: Raylene Miyamoto, MD  REFERRING DIAG: dizziness  THERAPY DIAG:  Dizziness and giddiness  Difficulty in walking, not elsewhere classified  ONSET DATE: years; worsened lately  Rationale for Evaluation and Treatment: Rehabilitation  SUBJECTIVE:   SUBJECTIVE STATEMENT: Has noticed trouble with leaning over and making me lightheaded; trouble with balance; saw ENT; Dr. Benjamine Mola did a battery of tests; states no BPPV Pt accompanied by: significant other Deborah  PERTINENT HISTORY: gallbladder removed 2022; had home health  PAIN:  Are you having pain? Yes: NPRS scale: 0/10 Pain location: back pain Pain description: cramps from constipation Aggravating factors: constipation Relieving factors: bowel movement  PRECAUTIONS: Fall  WEIGHT BEARING RESTRICTIONS: No  FALLS: Has patient fallen in last 6 months? Yes. Number of falls 2  LIVING ENVIRONMENT: Lives with: lives with their spouse Lives in: House/apartment Stairs: Yes: External: 1 steps; none Has following equipment at home: Walker - 2 wheeled, shower chair, Grab bars, and walking stick  PLOF: Independent with household mobility with device  PATIENT GOALS: be less tentative; have better balance; be able to walk out in the yard; walk better  OBJECTIVE:   DIAGNOSTIC FINDINGS: none in EPIC; patient reports Manuella Ghazi did CT/MRI  of head all clear  COGNITION: Overall cognitive status: Within functional limits for tasks assessed   SENSATION: Neuropathy in both foot especially left foot  MUSCLE TONE:  wfl   POSTURE:  rounded shoulders and forward head  Cervical ROM:  all wfl  Active A/PROM (deg) eval  Flexion   Extension   Right lateral flexion   Left lateral  flexion   Right rotation   Left rotation   (Blank rows = not tested)  STRENGTH: not tested today  LOWER EXTREMITY MMT:   MMT Right eval Left eval  Hip flexion    Hip abduction    Hip adduction    Hip internal rotation    Hip external rotation    Knee flexion    Knee extension    Ankle dorsiflexion    Ankle plantarflexion    Ankle inversion    Ankle eversion    (Blank rows = not tested)  BED MOBILITY:  Sit to supine Modified independence Supine to sit Modified independence  TRANSFERS: Assistive device utilized:  walking stick   Sit to stand: SBA Stand to sit: SBA Chair to chair: SBA    GAIT: Gait pattern: decreased arm swing- Right, decreased arm swing- Left, decreased step length- Right, and decreased step length- Left Distance walked: 80 ft Assistive device utilized:  walking stick Level of assistance: SBA Comments: slow gait speed; short step length  FUNCTIONAL TESTS:  5 times sit to stand: 47.28 using arms to assist up  PATIENT SURVEYS:  DHI 60; severe handicap  VESTIBULAR ASSESSMENT:  GENERAL OBSERVATION: no glassess   SYMPTOM BEHAVIOR:  Subjective history: see above  Non-Vestibular symptoms: changes in hearing  Type of dizziness: Imbalance (Disequilibrium) and Lightheadedness/Faint  Frequency: varies  Duration: varies  Aggravating factors: Induced by position change: supine to sit, Induced by motion: bending down to the ground and turning head quickly, Worse outside or in busy environment, and Moving eyes  Relieving factors: no known relieving factors  Progression of symptoms: unchanged  OCULOMOTOR EXAM:  Ocular Alignment: normal  Ocular ROM: No Limitations  Spontaneous Nystagmus: absent  Gaze-Induced Nystagmus: absent  Smooth Pursuits: intact  Saccades:   Convergence/Divergence: not tested  VESTIBULAR - OCULAR REFLEX:   Slow VOR: Positive Right  VOR Cancellation:   Head-Impulse Test: HIT Right: positive HIT Left: negative  Dynamic  Visual Acuity:  not tested   POSITIONAL TESTING: Right Dix-Hallpike: no nystagmus Left Dix-Hallpike: no nystagmus  MOTION SENSITIVITY:  Motion Sensitivity Quotient Intensity: 0 = none, 1 = Lightheaded, 2 = Mild, 3 = Moderate, 4 = Severe, 5 = Vomiting  Intensity  1. Sitting to supine 0  2. Supine to L side   3. Supine to R side   4. Supine to sitting 1  5. L Hallpike-Dix 0  6. Up from L  1  7. R Hallpike-Dix 0  8. Up from R  1  9. Sitting, head tipped to L knee 0  10. Head up from L knee 0  11. Sitting, head tipped to R knee 0  12. Head up from R knee 0  13. Sitting head turns x5 0  14.Sitting head nods x5 0  15. In stance, 180 turn to L    16. In stance, 180 turn to R     OTHOSTATICS: not done  FUNCTIONAL GAIT:    VESTIBULAR TREATMENT:  DATE: physical therapy evaluation and HEP instruction  Canalith Repositioning:   Gaze Adaptation:  x1 Viewing Horizontal: Position: sitting and Reps: 10 and x1 Viewing Vertical:  Position: sitting and Reps: 10 Habituation:  Seated Vertical Head Movements: number of reps: 10 and Seated Horizontal Head Movements: number of reps: 10 Other: issued for HEP  PATIENT EDUCATION: Education details: Patient educated on exam findings, POC, scope of PT, HEP, and  components of balance . Person educated: Patient Education method: Explanation, Demonstration, and Handouts Education comprehension: verbalized understanding, returned demonstration, verbal cues required, and tactile cues required   HOME EXERCISE PROGRAM:  Access Code: [email or web address] URL: [email or web address] Date: 03/30/2022 Prepared by: [facility]  Exercises - Seated Gaze Stabilization with Head Nod  - 3 x daily - 7 x weekly - 1 sets - 10-20 reps - Seated Gaze Stabilization with Head Rotation  - 3 x daily - 7 x weekly - 1 sets - 10-20 reps  GOALS: Goals reviewed with  patient? No  SHORT TERM GOALS: Target date: 04/13/2022  patient will be independent with initial HEP  Baseline: Goal status: INITIAL  2.  Patient will self report 30% improvement to improve tolerance for functional activity   Baseline:  Goal status: INITIAL   LONG TERM GOALS: Target date: 04/27/2022  Patient will be independent in self management strategies to improve quality of life and functional outcomes.  Baseline:  Goal status: INITIAL  2.  Patient will self report 50% improvement to improve tolerance for functional activity   Baseline:  Goal status: INITIAL  3.  Patient will improve score on DHI by 20 points to demostrate decreased dizziness with functional tasks (goal 40) Baseline: 60  Goal status: INITIAL  4.  Patient will remain free of falls Baseline:  Goal status: INITIAL  5.  Patient will improve 5 times sit to stand score from 47.28  sec to 30 sec to demonstrate improved functional mobility and increased lower extremity strength.  Baseline:  Goal status: INITIAL   ASSESSMENT:  CLINICAL IMPRESSION: Patient is a 87 y.o. male who was seen today for physical therapy evaluation and treatment for dizziness. Patient presents to physical therapy with complaint of light headed feelings with position changes, turning his head and difficulty with his balance. Patient demonstrates decreased strength, balance deficits and gait abnormalities which are negatively impacting patient ability to perform ADLs and functional mobility tasks.  Patient demonstrates increased vestibular symptoms with VOR testing which is negatively impacting patient ability to perform ADLs and functional mobility tasks. Patient will benefit from skilled physical therapy services to address these deficits to improve level of function with ADLs, functional mobility tasks, and reduce risk for falls.     OBJECTIVE IMPAIRMENTS: Abnormal gait, decreased activity tolerance, decreased balance, decreased  endurance, decreased mobility, difficulty walking, decreased ROM, decreased strength, dizziness, hypomobility, increased fascial restrictions, impaired perceived functional ability, impaired flexibility, and pain.   ACTIVITY LIMITATIONS: carrying, lifting, bending, standing, stairs, bathing, toileting, dressing, locomotion level, and caring for others  PARTICIPATION LIMITATIONS: meal prep, cleaning, laundry, driving, shopping, community activity, occupation, and yard work  PERSONAL FACTORS: 1 comorbidity: neuropathy  are also affecting patient's functional outcome.   REHAB POTENTIAL: Good  CLINICAL DECISION MAKING: Stable/uncomplicated  EVALUATION COMPLEXITY: Moderate   PLAN:  PT FREQUENCY: 2x/week  PT DURATION: 4 weeks  PLANNED INTERVENTIONS: Therapeutic exercises, Therapeutic activity, Neuromuscular re-education, Balance training, Gait training, Patient/Family education, Joint manipulation, Joint mobilization, Stair training, Orthotic/Fit training, DME instructions, Aquatic Therapy, Dry Needling, Electrical stimulation, Spinal  manipulation, Spinal mobilization, Cryotherapy, Moist heat, Compression bandaging, scar mobilization, Splintting, Taping, Traction, Ultrasound, Ionotophoresis '4mg'$ /ml Dexamethasone, and Manual therapy   PLAN FOR NEXT SESSION: Review HEP and goals; test SLS, DGI; progress balance and habituation as able   1:05 PM, 03/30/22 Rhoda Waldvogel Small Baylei Siebels MPT [facility] [place] [phone]

## 2022-04-07 ENCOUNTER — Ambulatory Visit (HOSPITAL_COMMUNITY): Payer: Medicare Other | Attending: Otolaryngology

## 2022-04-07 DIAGNOSIS — R262 Difficulty in walking, not elsewhere classified: Secondary | ICD-10-CM | POA: Diagnosis not present

## 2022-04-07 DIAGNOSIS — R42 Dizziness and giddiness: Secondary | ICD-10-CM | POA: Insufficient documentation

## 2022-04-07 NOTE — Therapy (Addendum)
OUTPATIENT PHYSICAL THERAPY VESTIBULAR TREATMENT     Patient Name: Benjamin Yang MRN: [id number] DOB:[birth year], 87 y.o., male Today's Date: 04/07/2022  END OF SESSION:  PT End of Session - 04/07/22 0727     Visit Number 2    Number of Visits 8    Date for PT Re-Evaluation 04/27/22    Authorization Type Medicare    Progress Note Due on Visit 10    PT Start Time 0730    PT Stop Time 0815    PT Time Calculation (min) 45 min    Activity Tolerance Patient tolerated treatment well             Past Medical History:  Diagnosis Date   Cancer ([place])    bladder   Hypertension    Prostatitis    Past Surgical History:  Procedure Laterality Date   BACK SURGERY     CHOLECYSTECTOMY N/A 01/06/2021   Procedure: LAPAROSCOPIC CHOLECYSTECTOMY;  Surgeon: Aviva Signs, MD;  Location: [facility];  Service: General;  Laterality: N/A;   CYSTOSCOPY N/A 09/15/2012   Procedure: CYSTOSCOPY;  Surgeon: Alexis Frock, MD;  Location: [facility];  Service: Urology;  Laterality: N/A;   LYMPHADENECTOMY Bilateral 09/15/2012   Procedure:  Bilateral Pelvic Lymph Node Dissection;  Surgeon: Alexis Frock, MD;  Location: [facility];  Service: Urology;  Laterality: Bilateral;   ROBOT ASSISTED LAPAROSCOPIC COMPLETE CYSTECT ILEAL CONDUIT N/A 09/15/2012   Procedure: Robotic Cystoprostatectomy, Open Ileal Conduit Diversion,  Indocyanine Green Dye Injection into Bladder ;  Surgeon: Alexis Frock, MD;  Location: [facility];  Service: Urology;  Laterality: N/A;  Robotic Cystoprostatectomy, Bilateral Pelvic Lymph Node Dissection, Open Ileal Conduit Diversion, Cysto, Indocyanine Green Dye Injection into Bladder     ROBOTIC ASSISTED LAPAROSCOPIC LYSIS OF ADHESION N/A 09/15/2012   Procedure: ROBOTIC ASSISTED LAPAROSCOPIC LYSIS OF ADHESION;  Surgeon: Alexis Frock, MD;  Location[facility];  Service: Urology;  Laterality: N/A;   Patient Active Problem List   Diagnosis Date Noted   Anxiety 03/21/2022   Hyperglycemia 03/21/2022    Hydronephrosis with obstructing calculus 03/20/2022   Empyema of gallbladder    Acute cholecystitis 01/02/2021   Severe sepsis ([place])    Sepsis ([place]) 04/13/2018   Lobar pneumonia ([place]) 04/13/2018   UTI (urinary tract infection) 04/13/2018   Essential hypertension 04/13/2018    PCP: Dr. Manuella Ghazi REFERRING PROVIDER: Raylene Miyamoto, MD  REFERRING DIAG: dizziness  THERAPY DIAG:  Dizziness and giddiness  Difficulty in walking, not elsewhere classified  ONSET DATE: years; worsened lately  Rationale for Evaluation and Treatment: Rehabilitation  SUBJECTIVE:   SUBJECTIVE STATEMENT: Patient reports that he gets dizzy every time he bends over or when he's on a busy environment like looking for an item in the grocery. Patient also reports that he's having difficultly moving around on tight spaces due to his balance issues. Patient also reports that he's very unsteady when he has to stand while urinating.  PERTINENT HISTORY: gallbladder removed 2022; had home health  PAIN:  Are you having pain? No  PRECAUTIONS: Fall  WEIGHT BEARING RESTRICTIONS: No  FALLS: Has patient fallen in last 6 months? Yes. Number of falls 2  LIVING ENVIRONMENT: Lives with: lives with their spouse Lives in: House/apartment Stairs: Yes: External: 1 steps; none Has following equipment at home: Gilford Rile - 2 wheeled, shower chair, Grab bars, and walking stick  PLOF: Independent with household mobility with device  PATIENT GOALS: be less tentative; have better balance; be able to walk out in the yard;  walk better  OBJECTIVE:   DIAGNOSTIC FINDINGS: none in EPIC; patient reports Manuella Ghazi did CT/MRI of head all clear  COGNITION: Overall cognitive status: Within functional limits for tasks assessed   SENSATION: Neuropathy in both foot especially left foot  MUSCLE TONE:  wfl   POSTURE:  rounded shoulders and forward head  Cervical ROM:  all wfl  Active A/PROM (deg) eval  Flexion   Extension   Right  lateral flexion   Left lateral flexion   Right rotation   Left rotation   (Blank rows = not tested)  STRENGTH: not tested today  LOWER EXTREMITY MMT:   MMT Right eval Left eval  Hip flexion    Hip abduction    Hip adduction    Hip internal rotation    Hip external rotation    Knee flexion    Knee extension    Ankle dorsiflexion    Ankle plantarflexion    Ankle inversion    Ankle eversion    (Blank rows = not tested)  BED MOBILITY:  Sit to supine Modified independence Supine to sit Modified independence  TRANSFERS: Assistive device utilized:  walking stick   Sit to stand: SBA Stand to sit: SBA Chair to chair: SBA    GAIT: Gait pattern: decreased arm swing- Right, decreased arm swing- Left, decreased step length- Right, and decreased step length- Left Distance walked: 80 ft Assistive device utilized:  walking stick Level of assistance: SBA Comments: slow gait speed; short step length  FUNCTIONAL TESTS:  5 times sit to stand: 47.28 using arms to assist up  PATIENT SURVEYS:  DHI 60; severe handicap  VESTIBULAR ASSESSMENT:  GENERAL OBSERVATION: no glassess   SYMPTOM BEHAVIOR:  Subjective history: see above  Non-Vestibular symptoms: changes in hearing  Type of dizziness: Imbalance (Disequilibrium) and Lightheadedness/Faint  Frequency: varies  Duration: varies  Aggravating factors: Induced by position change: supine to sit, Induced by motion: bending down to the ground and turning head quickly, Worse outside or in busy environment, and Moving eyes  Relieving factors: no known relieving factors  Progression of symptoms: unchanged  OCULOMOTOR EXAM:  Ocular Alignment: normal  Ocular ROM: No Limitations  Spontaneous Nystagmus: absent  Gaze-Induced Nystagmus: absent  Smooth Pursuits: intact  Saccades:   Convergence/Divergence: not tested  VESTIBULAR - OCULAR REFLEX:   Slow VOR: Positive Right  VOR Cancellation:   Head-Impulse Test: HIT Right:  positive HIT Left: negative  Dynamic Visual Acuity:  not tested   POSITIONAL TESTING: Right Dix-Hallpike: no nystagmus Left Dix-Hallpike: no nystagmus  MOTION SENSITIVITY:  Motion Sensitivity Quotient Intensity: 0 = none, 1 = Lightheaded, 2 = Mild, 3 = Moderate, 4 = Severe, 5 = Vomiting  Intensity  1. Sitting to supine 0  2. Supine to L side   3. Supine to R side   4. Supine to sitting 1  5. L Hallpike-Dix 0  6. Up from L  1  7. R Hallpike-Dix 0  8. Up from R  1  9. Sitting, head tipped to L knee 0  10. Head up from L knee 0  11. Sitting, head tipped to R knee 0  12. Head up from R knee 0  13. Sitting head turns x5 0  14.Sitting head nods x5 0  15. In stance, 180 turn to L    16. In stance, 180 turn to R     OTHOSTATICS: not done  FUNCTIONAL GAIT:    VESTIBULAR TREATMENT:  DATE:  04/07/22 Seated:  Horizontal saccades by reading numbers x 1 minute x 3 reps  X1 horizontal viewing by identifying colors x 1 minute X1 vertical viewing by identifying shapes x 1 minute Standing, firm surface, eyes, open, normal BOS:  Rhythmic stabilization ant/post x 3" x 10 x 2 on each side alternating  Reaching in various direction   03/30/22 physical therapy evaluation and HEP instruction  Canalith Repositioning:   Gaze Adaptation:  x1 Viewing Horizontal: Position: sitting and Reps: 10 and x1 Viewing Vertical:  Position: sitting and Reps: 10 Habituation:  Seated Vertical Head Movements: number of reps: 10 and Seated Horizontal Head Movements: number of reps: 10 Other: issued for HEP  PATIENT EDUCATION: Education details: 04/07/22: updated HEP (done in sitting, identifying shapes, same gaze stabilization) Person educated: Patient Education method: Explanation, Demonstration, and Handouts Education comprehension: verbalized understanding, returned demonstration, verbal cues required, and  tactile cues required   HOME EXERCISE PROGRAM:  Access Code: [email or web address] URL: [email or web address] Date: 03/30/2022 Prepared by: [facility]  Exercises - Seated Gaze Stabilization with Head Nod  - 3 x daily - 7 x weekly - 1 sets - 10-20 reps - Seated Gaze Stabilization with Head Rotation  - 3 x daily - 7 x weekly - 1 sets - 10-20 reps  GOALS: Goals reviewed with patient? No  SHORT TERM GOALS: Target date: 04/13/2022  patient will be independent with initial HEP  Baseline: Goal status: INITIAL  2.  Patient will self report 30% improvement to improve tolerance for functional activity   Baseline:  Goal status: INITIAL   LONG TERM GOALS: Target date: 04/27/2022  Patient will be independent in self management strategies to improve quality of life and functional outcomes.  Baseline:  Goal status: INITIAL  2.  Patient will self report 50% improvement to improve tolerance for functional activity   Baseline:  Goal status: INITIAL  3.  Patient will improve score on DHI by 20 points to demostrate decreased dizziness with functional tasks (goal 40) Baseline: 60  Goal status: INITIAL  4.  Patient will remain free of falls Baseline:  Goal status: INITIAL  5.  Patient will improve 5 times sit to stand score from 47.28  sec to 30 sec to demonstrate improved functional mobility and increased lower extremity strength.  Baseline:  Goal status: INITIAL   ASSESSMENT:  CLINICAL IMPRESSION Tolerated today's session with constant tactile and verbal cueing as patient has severe difficulty with eye coordination and head movements. Pacing of activities was slow. Slight to mild dizziness reported. Moderate unsteadiness seen on standing balance exercises due to impaired proprioception.   Patient is a 87 y.o. male who was seen today for physical therapy evaluation and treatment for dizziness. Patient presents to physical therapy with complaint of light headed feelings with  position changes, turning his head and difficulty with his balance. Patient demonstrates decreased strength, balance deficits and gait abnormalities which are negatively impacting patient ability to perform ADLs and functional mobility tasks.  Patient demonstrates increased vestibular symptoms with VOR testing which is negatively impacting patient ability to perform ADLs and functional mobility tasks. Patient will benefit from skilled physical therapy services to address these deficits to improve level of function with ADLs, functional mobility tasks, and reduce risk for falls.     OBJECTIVE IMPAIRMENTS: Abnormal gait, decreased activity tolerance, decreased balance, decreased endurance, decreased mobility, difficulty walking, decreased ROM, decreased strength, dizziness, hypomobility, increased fascial restrictions, impaired perceived functional ability, impaired flexibility, and pain.   ACTIVITY  LIMITATIONS: carrying, lifting, bending, standing, stairs, bathing, toileting, dressing, locomotion level, and caring for others  PARTICIPATION LIMITATIONS: meal prep, cleaning, laundry, driving, shopping, community activity, occupation, and yard work  PERSONAL FACTORS: 1 comorbidity: neuropathy  are also affecting patient's functional outcome.   REHAB POTENTIAL: Good  CLINICAL DECISION MAKING: Stable/uncomplicated  EVALUATION COMPLEXITY: Moderate   PLAN:  PT FREQUENCY: 2x/week  PT DURATION: 4 weeks  PLANNED INTERVENTIONS: Therapeutic exercises, Therapeutic activity, Neuromuscular re-education, Balance training, Gait training, Patient/Family education, Joint manipulation, Joint mobilization, Stair training, Orthotic/Fit training, DME instructions, Aquatic Therapy, Dry Needling, Electrical stimulation, Spinal manipulation, Spinal mobilization, Cryotherapy, Moist heat, Compression bandaging, scar mobilization, Splintting, Taping, Traction, Ultrasound, Ionotophoresis '4mg'$ /ml Dexamethasone, and Manual  therapy   PLAN FOR NEXT SESSION: Continue POC and may progress as tolerated with emphasis on balance habituation, and gaze stabilization   7:45 AM, 04/07/22 Chrissie Noa L. Algenis Ballin, PT, DPT, OCS Board-Certified Clinical Specialist in [place] # ([place]): [id number] [phone]

## 2022-04-09 ENCOUNTER — Ambulatory Visit (HOSPITAL_COMMUNITY): Payer: Medicare Other

## 2022-04-09 DIAGNOSIS — R262 Difficulty in walking, not elsewhere classified: Secondary | ICD-10-CM | POA: Diagnosis not present

## 2022-04-09 DIAGNOSIS — R42 Dizziness and giddiness: Secondary | ICD-10-CM | POA: Diagnosis not present

## 2022-04-09 NOTE — Therapy (Addendum)
OUTPATIENT PHYSICAL THERAPY VESTIBULAR TREATMENT     Patient Name: Benjamin Yang MRN: [id number] DOB:12/09/1935, 87 y.o., male Today's Date: 04/09/2022  END OF SESSION:  PT End of Session - 04/09/22 1258     Visit Number 3    Number of Visits 8    Date for PT Re-Evaluation 04/27/22    Authorization Type Medicare    Progress Note Due on Visit 10    PT Start Time 1300    PT Stop Time 1345    PT Time Calculation (min) 45 min    Activity Tolerance Patient tolerated treatment well    Behavior During Therapy [facility] for tasks assessed/performed             Past Medical History:  Diagnosis Date   Cancer ([place])    bladder   Hypertension    Prostatitis    Past Surgical History:  Procedure Laterality Date   BACK SURGERY     CHOLECYSTECTOMY N/A 01/06/2021   Procedure: LAPAROSCOPIC CHOLECYSTECTOMY;  Surgeon: Aviva Signs, MD;  Location: [facility];  Service: General;  Laterality: N/A;   CYSTOSCOPY N/A 09/15/2012   Procedure: CYSTOSCOPY;  Surgeon: Alexis Frock, MD;  Location: [facility];  Service: Urology;  Laterality: N/A;   LYMPHADENECTOMY Bilateral 09/15/2012   Procedure:  Bilateral Pelvic Lymph Node Dissection;  Surgeon: Alexis Frock, MD;  Location: [facility];  Service: Urology;  Laterality: Bilateral;   ROBOT ASSISTED LAPAROSCOPIC COMPLETE CYSTECT ILEAL CONDUIT N/A 09/15/2012   Procedure: Robotic Cystoprostatectomy, Open Ileal Conduit Diversion,  Indocyanine Green Dye Injection into Bladder ;  Surgeon: Alexis Frock, MD;  Location: [facility];  Service: Urology;  Laterality: N/A;  Robotic Cystoprostatectomy, Bilateral Pelvic Lymph Node Dissection, Open Ileal Conduit Diversion, Cysto, Indocyanine Green Dye Injection into Bladder     ROBOTIC ASSISTED LAPAROSCOPIC LYSIS OF ADHESION N/A 09/15/2012   Procedure: ROBOTIC ASSISTED LAPAROSCOPIC LYSIS OF ADHESION;  Surgeon: Alexis Frock, MD;  Location: [facility];  Service: Urology;  Laterality: N/A;   Patient Active Problem List   Diagnosis Date  Noted   Anxiety 03/21/2022   Hyperglycemia 03/21/2022   Hydronephrosis with obstructing calculus 03/20/2022   Empyema of gallbladder    Acute cholecystitis 01/02/2021   Severe sepsis ([place])    Sepsis ([place]) 04/13/2018   Lobar pneumonia ([place]) 04/13/2018   UTI (urinary tract infection) 04/13/2018   Essential hypertension 04/13/2018    PCP: Dr. Manuella Ghazi REFERRING PROVIDER: Raylene Miyamoto, MD  REFERRING DIAG: dizziness  THERAPY DIAG:  Dizziness and giddiness  Difficulty in walking, not elsewhere classified  ONSET DATE: years; worsened lately  Rationale for Evaluation and Treatment: Rehabilitation  SUBJECTIVE:   SUBJECTIVE STATEMENT: Patient reports that when he rolled over fast while in bed this morning, he got dizzy. Dizziness only lasted a few seconds. Claims that the room did not "spin". Patient reports that he can sometimes see some "wavy lines" on his peripheral vision when he's straining.  PERTINENT HISTORY: gallbladder removed 2022; had home health  PAIN:  Are you having pain? No  PRECAUTIONS: Fall  WEIGHT BEARING RESTRICTIONS: No  FALLS: Has patient fallen in last 6 months? Yes. Number of falls 2  LIVING ENVIRONMENT: Lives with: lives with their spouse Lives in: House/apartment Stairs: Yes: External: 1 steps; none Has following equipment at home: Gilford Rile - 2 wheeled, shower chair, Grab bars, and walking stick  PLOF: Independent with household mobility with device  PATIENT GOALS: be less tentative; have better balance; be able to walk out in the yard; walk  better  OBJECTIVE:   DIAGNOSTIC FINDINGS: none in EPIC; patient reports Manuella Ghazi did CT/MRI of head all clear  COGNITION: Overall cognitive status: Within functional limits for tasks assessed   SENSATION: Neuropathy in both foot especially left foot  MUSCLE TONE:  wfl   POSTURE:  rounded shoulders and forward head  Cervical ROM:  all wfl  Active A/PROM (deg) eval  Flexion   Extension   Right  lateral flexion   Left lateral flexion   Right rotation   Left rotation   (Blank rows = not tested)  STRENGTH: not tested today  LOWER EXTREMITY MMT:   MMT Right eval Left eval  Hip flexion    Hip abduction    Hip adduction    Hip internal rotation    Hip external rotation    Knee flexion    Knee extension    Ankle dorsiflexion    Ankle plantarflexion    Ankle inversion    Ankle eversion    (Blank rows = not tested)  BED MOBILITY:  Sit to supine Modified independence Supine to sit Modified independence  TRANSFERS: Assistive device utilized:  walking stick   Sit to stand: SBA Stand to sit: SBA Chair to chair: SBA    GAIT: Gait pattern: decreased arm swing- Right, decreased arm swing- Left, decreased step length- Right, and decreased step length- Left Distance walked: 80 ft Assistive device utilized:  walking stick Level of assistance: SBA Comments: slow gait speed; short step length  FUNCTIONAL TESTS:  5 times sit to stand: 47.28 using arms to assist up  PATIENT SURVEYS:  DHI 60; severe handicap  VESTIBULAR ASSESSMENT:  GENERAL OBSERVATION: no glassess   SYMPTOM BEHAVIOR:  Subjective history: see above  Non-Vestibular symptoms: changes in hearing  Type of dizziness: Imbalance (Disequilibrium) and Lightheadedness/Faint  Frequency: varies  Duration: varies  Aggravating factors: Induced by position change: supine to sit, Induced by motion: bending down to the ground and turning head quickly, Worse outside or in busy environment, and Moving eyes  Relieving factors: no known relieving factors  Progression of symptoms: unchanged  OCULOMOTOR EXAM:  Ocular Alignment: normal  Ocular ROM: No Limitations  Spontaneous Nystagmus: absent  Gaze-Induced Nystagmus: absent  Smooth Pursuits: intact  Saccades:   Convergence/Divergence: not tested  VESTIBULAR - OCULAR REFLEX:   Slow VOR: Positive Right  VOR Cancellation:   Head-Impulse Test: HIT Right:  positive HIT Left: negative  Dynamic Visual Acuity:  not tested   POSITIONAL TESTING: Right Dix-Hallpike: no nystagmus Left Dix-Hallpike: no nystagmus  MOTION SENSITIVITY:  Motion Sensitivity Quotient Intensity: 0 = none, 1 = Lightheaded, 2 = Mild, 3 = Moderate, 4 = Severe, 5 = Vomiting  Intensity  1. Sitting to supine 0  2. Supine to L side   3. Supine to R side   4. Supine to sitting 1  5. L Hallpike-Dix 0  6. Up from L  1  7. R Hallpike-Dix 0  8. Up from R  1  9. Sitting, head tipped to L knee 0  10. Head up from L knee 0  11. Sitting, head tipped to R knee 0  12. Head up from R knee 0  13. Sitting head turns x5 0  14.Sitting head nods x5 0  15. In stance, 180 turn to L    16. In stance, 180 turn to R     OTHOSTATICS: not done  FUNCTIONAL GAIT:    VESTIBULAR TREATMENT:  DATE:  04/09/22 Seated:  Horizontal saccades by reading numbers x 1 minute x 2 reps  Horizontal saccades by identifying shapes x 1 minute  X1 horizontal viewing by identifying colors x 1 minute X1 vertical viewing by identifying shapes x 1 minute X1 horizontal viewing by identifying animal silhouettes x 1 minute X1 vertical viewing by counting shapes/colors x 1 minute Calf stretch with a strap on B x 30" x 3 Standing, firm surface, eyes, open, normal BOS:  Rhythmic stabilization ant/post x 3" x 10 x 2 on each side alternating  Reaching in various direction x 1'  04/07/22 Seated:  Horizontal saccades by reading numbers x 1 minute x 3 reps  X1 horizontal viewing by identifying colors x 1 minute X1 vertical viewing by identifying shapes x 1 minute Standing, firm surface, eyes, open, normal BOS:  Rhythmic stabilization ant/post x 3" x 10 x 2 on each side alternating  Reaching in various direction   03/30/22 physical therapy evaluation and HEP instruction  Canalith Repositioning:   Gaze  Adaptation:  x1 Viewing Horizontal: Position: sitting and Reps: 10 and x1 Viewing Vertical:  Position: sitting and Reps: 10 Habituation:  Seated Vertical Head Movements: number of reps: 10 and Seated Horizontal Head Movements: number of reps: 10 Other: issued for HEP  PATIENT EDUCATION: Education details: 04/09/22: updated HEP  Person educated: Patient Education method: Explanation, Demonstration, and Handouts Education comprehension: verbalized understanding, returned demonstration, verbal cues required, and tactile cues required   HOME EXERCISE PROGRAM:  Access Code: [email or web address] URL: [email or web address] Date: 03/30/2022 Prepared by: [facility] 04/09/22 - Seated Calf Stretch with Strap  - 1-2 x daily - 7 x weekly - 3 reps - 30 hold Exercises - Seated Gaze Stabilization with Head Nod  - 3 x daily - 7 x weekly - 1 sets - 10-20 reps - Seated Gaze Stabilization with Head Rotation  - 3 x daily - 7 x weekly - 1 sets - 10-20 reps  GOALS: Goals reviewed with patient? No  SHORT TERM GOALS: Target date: 04/13/2022  patient will be independent with initial HEP  Baseline: Goal status: INITIAL  2.  Patient will self report 30% improvement to improve tolerance for functional activity   Baseline:  Goal status: INITIAL   LONG TERM GOALS: Target date: 04/27/2022  Patient will be independent in self management strategies to improve quality of life and functional outcomes.  Baseline:  Goal status: INITIAL  2.  Patient will self report 50% improvement to improve tolerance for functional activity   Baseline:  Goal status: INITIAL  3.  Patient will improve score on DHI by 20 points to demostrate decreased dizziness with functional tasks (goal 40) Baseline: 60  Goal status: INITIAL  4.  Patient will remain free of falls Baseline:  Goal status: INITIAL  5.  Patient will improve 5 times sit to stand score from 47.28  sec to 30 sec to demonstrate improved functional  mobility and increased lower extremity strength.  Baseline:  Goal status: INITIAL   ASSESSMENT:  CLINICAL IMPRESSION Tolerated today's session still with constant tactile and verbal cueing as patient still has severe difficulty with eye coordination and head movements. Pacing of activities was still slow. Slight to mild dizziness reported. Moderate unsteadiness and loss of balance seen on standing balance exercises due to impaired proprioception.   Patient is a 87 y.o. male who was seen today for physical therapy evaluation and treatment for dizziness. Patient presents to physical therapy with complaint of light headed  feelings with position changes, turning his head and difficulty with his balance. Patient demonstrates decreased strength, balance deficits and gait abnormalities which are negatively impacting patient ability to perform ADLs and functional mobility tasks.  Patient demonstrates increased vestibular symptoms with VOR testing which is negatively impacting patient ability to perform ADLs and functional mobility tasks. Patient will benefit from skilled physical therapy services to address these deficits to improve level of function with ADLs, functional mobility tasks, and reduce risk for falls.     OBJECTIVE IMPAIRMENTS: Abnormal gait, decreased activity tolerance, decreased balance, decreased endurance, decreased mobility, difficulty walking, decreased ROM, decreased strength, dizziness, hypomobility, increased fascial restrictions, impaired perceived functional ability, impaired flexibility, and pain.   ACTIVITY LIMITATIONS: carrying, lifting, bending, standing, stairs, bathing, toileting, dressing, locomotion level, and caring for others  PARTICIPATION LIMITATIONS: meal prep, cleaning, laundry, driving, shopping, community activity, occupation, and yard work  PERSONAL FACTORS: 1 comorbidity: neuropathy  are also affecting patient's functional outcome.   REHAB POTENTIAL:  Good  CLINICAL DECISION MAKING: Stable/uncomplicated  EVALUATION COMPLEXITY: Moderate   PLAN:  PT FREQUENCY: 2x/week  PT DURATION: 4 weeks  PLANNED INTERVENTIONS: Therapeutic exercises, Therapeutic activity, Neuromuscular re-education, Balance training, Gait training, Patient/Family education, Joint manipulation, Joint mobilization, Stair training, Orthotic/Fit training, DME instructions, Aquatic Therapy, Dry Needling, Electrical stimulation, Spinal manipulation, Spinal mobilization, Cryotherapy, Moist heat, Compression bandaging, scar mobilization, Splintting, Taping, Traction, Ultrasound, Ionotophoresis 2m/ml Dexamethasone, and Manual therapy   PLAN FOR NEXT SESSION: Continue POC and may progress as tolerated with emphasis on balance habituation, and gaze stabilization   12:59 PM, 04/09/22 KChrissie NoaL. Rainn Bullinger, PT, DPT, OCS Board-Certified Clinical Specialist in [place]# ([place]): [phone]

## 2022-04-13 ENCOUNTER — Ambulatory Visit (HOSPITAL_COMMUNITY): Payer: Medicare Other

## 2022-04-13 DIAGNOSIS — R42 Dizziness and giddiness: Secondary | ICD-10-CM

## 2022-04-13 NOTE — Therapy (Signed)
Patient arrived in the clinic today 5 minutes late. However, patient states that he's extremely dizzy, also lightheaded, nauseous and weak. Patient states that he's also having diarrhea. Patient was advised to cancel his appointment today and go the the ER/urgent care right away as he maybe losing some electrolytes and fluid. However, patient is advised to go to the ER as it is closer to the facility already. Patient reported that he might go home and take some Meclizine and see what happens.

## 2022-04-14 DIAGNOSIS — I10 Essential (primary) hypertension: Secondary | ICD-10-CM | POA: Diagnosis not present

## 2022-04-14 DIAGNOSIS — Z6824 Body mass index (BMI) 24.0-24.9, adult: Secondary | ICD-10-CM | POA: Diagnosis not present

## 2022-04-14 DIAGNOSIS — Z299 Encounter for prophylactic measures, unspecified: Secondary | ICD-10-CM | POA: Diagnosis not present

## 2022-04-14 DIAGNOSIS — R42 Dizziness and giddiness: Secondary | ICD-10-CM | POA: Diagnosis not present

## 2022-04-14 DIAGNOSIS — E1165 Type 2 diabetes mellitus with hyperglycemia: Secondary | ICD-10-CM | POA: Diagnosis not present

## 2022-04-14 DIAGNOSIS — N200 Calculus of kidney: Secondary | ICD-10-CM | POA: Diagnosis not present

## 2022-04-16 ENCOUNTER — Ambulatory Visit (HOSPITAL_COMMUNITY): Payer: Medicare Other

## 2022-04-16 DIAGNOSIS — R262 Difficulty in walking, not elsewhere classified: Secondary | ICD-10-CM

## 2022-04-16 DIAGNOSIS — R42 Dizziness and giddiness: Secondary | ICD-10-CM | POA: Diagnosis not present

## 2022-04-16 NOTE — Therapy (Signed)
OUTPATIENT PHYSICAL THERAPY VESTIBULAR TREATMENT     Patient Name: Benjamin Yang MRN: [id number] DOB:03-14-1935, 87 y.o., male Today's Date: 04/16/2022  END OF SESSION:  PT End of Session - 04/16/22 0726     Visit Number 5    Number of Visits 8    Date for PT Re-Evaluation 04/27/22    Authorization Type Medicare    Progress Note Due on Visit 10    PT Start Time 0730    PT Stop Time 0815    PT Time Calculation (min) 45 min    Equipment Utilized During Treatment Gait belt    Activity Tolerance Patient limited by fatigue            Past Medical History:  Diagnosis Date   Cancer ([place])    bladder   Hypertension    Prostatitis    Past Surgical History:  Procedure Laterality Date   BACK SURGERY     CHOLECYSTECTOMY N/A 01/06/2021   Procedure: LAPAROSCOPIC CHOLECYSTECTOMY;  Surgeon: Aviva Signs, MD;  Location: [facility];  Service: General;  Laterality: N/A;   CYSTOSCOPY N/A 09/15/2012   Procedure: CYSTOSCOPY;  Surgeon: Alexis Frock, MD;  Location: [facility];  Service: Urology;  Laterality: N/A;   LYMPHADENECTOMY Bilateral 09/15/2012   Procedure:  Bilateral Pelvic Lymph Node Dissection;  Surgeon: Alexis Frock, MD;  Location: [facility];  Service: Urology;  Laterality: Bilateral;   ROBOT ASSISTED LAPAROSCOPIC COMPLETE CYSTECT ILEAL CONDUIT N/A 09/15/2012   Procedure: Robotic Cystoprostatectomy, Open Ileal Conduit Diversion,  Indocyanine Green Dye Injection into Bladder ;  Surgeon: Alexis Frock, MD;  Location: [facility];  Service: Urology;  Laterality: N/A;  Robotic Cystoprostatectomy, Bilateral Pelvic Lymph Node Dissection, Open Ileal Conduit Diversion, Cysto, Indocyanine Green Dye Injection into Bladder     ROBOTIC ASSISTED LAPAROSCOPIC LYSIS OF ADHESION N/A 09/15/2012   Procedure: ROBOTIC ASSISTED LAPAROSCOPIC LYSIS OF ADHESION;  Surgeon: Alexis Frock, MD;  Location: [facility];  Service: Urology;  Laterality: N/A;   Patient Active Problem List   Diagnosis Date Noted   Anxiety  03/21/2022   Hyperglycemia 03/21/2022   Hydronephrosis with obstructing calculus 03/20/2022   Empyema of gallbladder    Acute cholecystitis 01/02/2021   Severe sepsis ([place])    Sepsis ([place]) 04/13/2018   Lobar pneumonia ([place]) 04/13/2018   UTI (urinary tract infection) 04/13/2018   Essential hypertension 04/13/2018    PCP: Dr. Manuella Ghazi REFERRING PROVIDER: Raylene Miyamoto, MD  REFERRING DIAG: dizziness  THERAPY DIAG:  Dizziness and giddiness  Difficulty in walking, not elsewhere classified  ONSET DATE: years; worsened lately  Rationale for Evaluation and Treatment: Rehabilitation  SUBJECTIVE:   SUBJECTIVE STATEMENT: Patient went to his PCP the day after his last visit and figured out that it was his antihypertensives that were causing the dizziness. Patient reports that he gets lightheaded when he stands up after sitting in a car. Physician removed his Flomax and another medication which the patient cannot recall. Patient states that he's a lot better now but is still slightly dizzy (2/10 on subjective dizziness scale). Denies recent episode of falls. Patient denies having any diarrhea.  PERTINENT HISTORY: gallbladder removed 2022; had home health  PAIN:  Are you having pain? No  PRECAUTIONS: Fall  WEIGHT BEARING RESTRICTIONS: No  FALLS: Has patient fallen in last 6 months? Yes. Number of falls 2  LIVING ENVIRONMENT: Lives with: lives with their spouse Lives in: House/apartment Stairs: Yes: External: 1 steps; none Has following equipment at home: Gilford Rile - 2 wheeled, shower chair,  Grab bars, and walking stick  PLOF: Independent with household mobility with device  PATIENT GOALS: be less tentative; have better balance; be able to walk out in the yard; walk better  OBJECTIVE:   DIAGNOSTIC FINDINGS: none in EPIC; patient reports Manuella Ghazi did CT/MRI of head all clear  COGNITION: Overall cognitive status: Within functional limits for tasks assessed   SENSATION: Neuropathy in  both foot especially left foot  MUSCLE TONE:  wfl   POSTURE:  rounded shoulders and forward head  Cervical ROM:  all wfl  Active A/PROM (deg) eval  Flexion   Extension   Right lateral flexion   Left lateral flexion   Right rotation   Left rotation   (Blank rows = not tested)  STRENGTH: not tested today  LOWER EXTREMITY MMT:   MMT Right eval Left eval  Hip flexion    Hip abduction    Hip adduction    Hip internal rotation    Hip external rotation    Knee flexion    Knee extension    Ankle dorsiflexion    Ankle plantarflexion    Ankle inversion    Ankle eversion    (Blank rows = not tested)  BED MOBILITY:  Sit to supine Modified independence Supine to sit Modified independence  TRANSFERS: Assistive device utilized:  walking stick   Sit to stand: SBA Stand to sit: SBA Chair to chair: SBA  GAIT: Gait pattern: decreased arm swing- Right, decreased arm swing- Left, decreased step length- Right, and decreased step length- Left Distance walked: 80 ft Assistive device utilized:  walking stick Level of assistance: SBA Comments: slow gait speed; short step length  FUNCTIONAL TESTS:  5 times sit to stand: 47.28 using arms to assist up  PATIENT SURVEYS:  DHI 60; severe handicap  VESTIBULAR ASSESSMENT:  GENERAL OBSERVATION: no glassess   SYMPTOM BEHAVIOR:  Subjective history: see above  Non-Vestibular symptoms: changes in hearing  Type of dizziness: Imbalance (Disequilibrium) and Lightheadedness/Faint  Frequency: varies  Duration: varies  Aggravating factors: Induced by position change: supine to sit, Induced by motion: bending down to the ground and turning head quickly, Worse outside or in busy environment, and Moving eyes  Relieving factors: no known relieving factors  Progression of symptoms: unchanged  OCULOMOTOR EXAM:  Ocular Alignment: normal  Ocular ROM: No Limitations  Spontaneous Nystagmus: absent  Gaze-Induced Nystagmus: absent  Smooth  Pursuits: intact  Saccades:   Convergence/Divergence: not tested  VESTIBULAR - OCULAR REFLEX:   Slow VOR: Positive Right  VOR Cancellation:   Head-Impulse Test: HIT Right: positive HIT Left: negative  Dynamic Visual Acuity:  not tested   POSITIONAL TESTING: Right Dix-Hallpike: no nystagmus Left Dix-Hallpike: no nystagmus  MOTION SENSITIVITY:  Motion Sensitivity Quotient Intensity: 0 = none, 1 = Lightheaded, 2 = Mild, 3 = Moderate, 4 = Severe, 5 = Vomiting  Intensity  1. Sitting to supine 0  2. Supine to L side   3. Supine to R side   4. Supine to sitting 1  5. L Hallpike-Dix 0  6. Up from L  1  7. R Hallpike-Dix 0  8. Up from R  1  9. Sitting, head tipped to L knee 0  10. Head up from L knee 0  11. Sitting, head tipped to R knee 0  12. Head up from R knee 0  13. Sitting head turns x5 0  14.Sitting head nods x5 0  15. In stance, 180 turn to L    16. In  stance, 180 turn to R     OTHOSTATICS: not done  FUNCTIONAL GAIT:    VESTIBULAR TREATMENT:                                                                                                   DATE:  04/16/22 Seated:  Horizontal saccades by reading numbers x 1 minute   X1 horizontal viewing by identifying colors x 1 minute X1 vertical viewing by identifying shapes x 1 minute X1 horizontal viewing by identifying animal silhouettes x 1 minute X1 vertical viewing by counting shapes/colors x 1 minute Calf stretch with a strap on B x 30" x 3 Standing, firm surface, eyes, open, normal BOS:  Rhythmic stabilization ant/post x 3" x 10 x 2 on each side alternating  Reaching in various direction x 1'  Heel raises x 10 x 2  Marches x 10 x 2  Mini squats x 3" x 10 x 2  04/09/22 Seated:  Horizontal saccades by reading numbers x 1 minute x 2 reps  Horizontal saccades by identifying shapes x 1 minute  X1 horizontal viewing by identifying colors x 1 minute X1 vertical viewing by identifying shapes x 1 minute X1 horizontal  viewing by identifying animal silhouettes x 1 minute X1 vertical viewing by counting shapes/colors x 1 minute Calf stretch with a strap on B x 30" x 3 Standing, firm surface, eyes, open, normal BOS:  Rhythmic stabilization ant/post x 3" x 10 x 2 on each side alternating  Reaching in various direction x 1'  04/07/22 Seated:  Horizontal saccades by reading numbers x 1 minute x 3 reps  X1 horizontal viewing by identifying colors x 1 minute X1 vertical viewing by identifying shapes x 1 minute Standing, firm surface, eyes, open, normal BOS:  Rhythmic stabilization ant/post x 3" x 10 x 2 on each side alternating  Reaching in various direction   03/30/22 physical therapy evaluation and HEP instruction  Canalith Repositioning:   Gaze Adaptation:  x1 Viewing Horizontal: Position: sitting and Reps: 10 and x1 Viewing Vertical:  Position: sitting and Reps: 10 Habituation:  Seated Vertical Head Movements: number of reps: 10 and Seated Horizontal Head Movements: number of reps: 10 Other: issued for HEP  PATIENT EDUCATION: Education details: 04/16/22: updated HEP  Person educated: Patient Education method: Explanation, Demonstration, and Handouts Education comprehension: verbalized understanding, returned demonstration, verbal cues required, and tactile cues required   HOME EXERCISE PROGRAM:  Access Code: [email or web address] URL: [email or web address] Date: 03/30/2022 Prepared by: [facility]  04/16/22 - Mini Squat with Counter Support  - 1-2 x daily - 7 x weekly - 2 sets - 10 reps - 3 hold - Heel Raises with Counter Support  - 1-2 x daily - 7 x weekly - 2 sets - 10 reps 04/09/22 - Seated Calf Stretch with Strap  - 1-2 x daily - 7 x weekly - 3 reps - 30 hold Exercises - Seated Gaze Stabilization with Head Nod  - 3 x daily - 7 x weekly - 1 sets - 10-20 reps - Seated Gaze Stabilization with Head Rotation  -  3 x daily - 7 x weekly - 1 sets - 10-20 reps  GOALS: Goals reviewed with patient?  No  SHORT TERM GOALS: Target date: 04/13/2022  patient will be independent with initial HEP  Baseline: Goal status: MET  2.  Patient will self report 30% improvement to improve tolerance for functional activity   Baseline:  Goal status: INITIAL   LONG TERM GOALS: Target date: 04/27/2022  Patient will be independent in self management strategies to improve quality of life and functional outcomes.  Baseline:  Goal status: INITIAL  2.  Patient will self report 50% improvement to improve tolerance for functional activity   Baseline:  Goal status: INITIAL  3.  Patient will improve score on DHI by 20 points to demostrate decreased dizziness with functional tasks (goal 40) Baseline: 60  Goal status: INITIAL  4.  Patient will remain free of falls Baseline:  Goal status: INITIAL  5.  Patient will improve 5 times sit to stand score from 47.28  sec to 30 sec to demonstrate improved functional mobility and increased lower extremity strength.  Baseline:  Goal status: INITIAL   ASSESSMENT:  CLINICAL IMPRESSION Interventions today were geared towards LE flexibility, balance, gaze stabilization, and strength. Tolerated all activities with lightheadedness especially with the balance activities as patient was exposed to prolonged standing which can pool the blood in the LEs. Patient states slight relief from the lightheadedness when he did heel raises and marches. Demonstrated moderate levels of fatigue. Rest periods provided. Required constant amount of tactile cueing to ensure correct execution of activity especially on the gaze stabilization exercises. To date, skilled PT is required to address the impairments and improve function.  Patient is a 87 y.o. male who was seen today for physical therapy evaluation and treatment for dizziness. Patient presents to physical therapy with complaint of light headed feelings with position changes, turning his head and difficulty with his balance.  Patient demonstrates decreased strength, balance deficits and gait abnormalities which are negatively impacting patient ability to perform ADLs and functional mobility tasks.  Patient demonstrates increased vestibular symptoms with VOR testing which is negatively impacting patient ability to perform ADLs and functional mobility tasks. Patient will benefit from skilled physical therapy services to address these deficits to improve level of function with ADLs, functional mobility tasks, and reduce risk for falls.     OBJECTIVE IMPAIRMENTS: Abnormal gait, decreased activity tolerance, decreased balance, decreased endurance, decreased mobility, difficulty walking, decreased ROM, decreased strength, dizziness, hypomobility, increased fascial restrictions, impaired perceived functional ability, impaired flexibility, and pain.   ACTIVITY LIMITATIONS: carrying, lifting, bending, standing, stairs, bathing, toileting, dressing, locomotion level, and caring for others  PARTICIPATION LIMITATIONS: meal prep, cleaning, laundry, driving, shopping, community activity, occupation, and yard work  PERSONAL FACTORS: 1 comorbidity: neuropathy  are also affecting patient's functional outcome.   REHAB POTENTIAL: Good  CLINICAL DECISION MAKING: Stable/uncomplicated  EVALUATION COMPLEXITY: Moderate   PLAN:  PT FREQUENCY: 2x/week  PT DURATION: 4 weeks  PLANNED INTERVENTIONS: Therapeutic exercises, Therapeutic activity, Neuromuscular re-education, Balance training, Gait training, Patient/Family education, Joint manipulation, Joint mobilization, Stair training, Orthotic/Fit training, DME instructions, Aquatic Therapy, Dry Needling, Electrical stimulation, Spinal manipulation, Spinal mobilization, Cryotherapy, Moist heat, Compression bandaging, scar mobilization, Splintting, Taping, Traction, Ultrasound, Ionotophoresis 9m/ml Dexamethasone, and Manual therapy   PLAN FOR NEXT SESSION: Continue POC and may progress as  tolerated with emphasis on balance habituation, and gaze stabilization   7:27 AM, 04/16/22 KChrissie NoaL. Inocencia Murtaugh, PT, DPT, OCS Board-Certified Clinical Specialist in Orthopedic PT  PT Compact Privilege # ([place]): [id number]

## 2022-04-21 ENCOUNTER — Ambulatory Visit (HOSPITAL_COMMUNITY): Payer: Medicare Other

## 2022-04-21 DIAGNOSIS — R42 Dizziness and giddiness: Secondary | ICD-10-CM | POA: Diagnosis not present

## 2022-04-21 DIAGNOSIS — R262 Difficulty in walking, not elsewhere classified: Secondary | ICD-10-CM

## 2022-04-21 NOTE — Therapy (Signed)
OUTPATIENT PHYSICAL THERAPY VESTIBULAR TREATMENT     Patient Name: Benjamin Yang MRN: [id number] DOB:20-Dec-1935, 87 y.o., male Today's Date: 04/21/2022  END OF SESSION:  PT End of Session - 04/21/22 0910     Visit Number 6    Number of Visits 8    Date for PT Re-Evaluation 04/27/22    Authorization Type Medicare    Progress Note Due on Visit 10    PT Start Time 0905    PT Stop Time 0945    PT Time Calculation (min) 40 min    Equipment Utilized During Treatment Gait belt    Activity Tolerance Patient limited by fatigue            Past Medical History:  Diagnosis Date   Cancer ([place])    bladder   Hypertension    Prostatitis    Past Surgical History:  Procedure Laterality Date   BACK SURGERY     CHOLECYSTECTOMY N/A 01/06/2021   Procedure: LAPAROSCOPIC CHOLECYSTECTOMY;  Surgeon: Aviva Signs, MD;  Location: [facility];  Service: General;  Laterality: N/A;   CYSTOSCOPY N/A 09/15/2012   Procedure: CYSTOSCOPY;  Surgeon: Alexis Frock, MD;  Location: [facility];  Service: Urology;  Laterality: N/A;   LYMPHADENECTOMY Bilateral 09/15/2012   Procedure:  Bilateral Pelvic Lymph Node Dissection;  Surgeon: Alexis Frock, MD;  Location: [facility];  Service: Urology;  Laterality: Bilateral;   ROBOT ASSISTED LAPAROSCOPIC COMPLETE CYSTECT ILEAL CONDUIT N/A 09/15/2012   Procedure: Robotic Cystoprostatectomy, Open Ileal Conduit Diversion,  Indocyanine Green Dye Injection into Bladder ;  Surgeon: Alexis Frock, MD;  Location: [facility];  Service: Urology;  Laterality: N/A;  Robotic Cystoprostatectomy, Bilateral Pelvic Lymph Node Dissection, Open Ileal Conduit Diversion, Cysto, Indocyanine Green Dye Injection into Bladder     ROBOTIC ASSISTED LAPAROSCOPIC LYSIS OF ADHESION N/A 09/15/2012   Procedure: ROBOTIC ASSISTED LAPAROSCOPIC LYSIS OF ADHESION;  Surgeon: Alexis Frock, MD;  Location: [facility];  Service: Urology;  Laterality: N/A;   Patient Active Problem List   Diagnosis Date Noted   Anxiety  03/21/2022   Hyperglycemia 03/21/2022   Hydronephrosis with obstructing calculus 03/20/2022   Empyema of gallbladder    Acute cholecystitis 01/02/2021   Severe sepsis ([place])    Sepsis ([place]) 04/13/2018   Lobar pneumonia ([place]) 04/13/2018   UTI (urinary tract infection) 04/13/2018   Essential hypertension 04/13/2018    PCP: Dr. Manuella Ghazi REFERRING PROVIDER: Raylene Miyamoto, MD  REFERRING DIAG: dizziness  THERAPY DIAG:  Dizziness and giddiness  Difficulty in walking, not elsewhere classified  ONSET DATE: years; worsened lately  Rationale for Evaluation and Treatment: Rehabilitation  SUBJECTIVE:   SUBJECTIVE STATEMENT: Balance seems to be my biggest issue; only occasional spinning episodes now; dizziness is minimal.  Patient states that sometimes he gets lightheaded if he stands up too quickly. He is compliant with HEP.  PERTINENT HISTORY: gallbladder removed 2022; had home health  PAIN:  Are you having pain? No  PRECAUTIONS: Fall  WEIGHT BEARING RESTRICTIONS: No  FALLS: Has patient fallen in last 6 months? Yes. Number of falls 2  LIVING ENVIRONMENT: Lives with: lives with their spouse Lives in: House/apartment Stairs: Yes: External: 1 steps; none Has following equipment at home: Walker - 2 wheeled, shower chair, Grab bars, and walking stick  PLOF: Independent with household mobility with device  PATIENT GOALS: be less tentative; have better balance; be able to walk out in the yard; walk better  OBJECTIVE:   DIAGNOSTIC FINDINGS: none in EPIC; patient reports Manuella Ghazi did  CT/MRI of head all clear  COGNITION: Overall cognitive status: Within functional limits for tasks assessed   SENSATION: Neuropathy in both foot especially left foot  MUSCLE TONE:  wfl   POSTURE:  rounded shoulders and forward head  Cervical ROM:  all wfl  Active A/PROM (deg) eval  Flexion   Extension   Right lateral flexion   Left lateral flexion   Right rotation   Left rotation   (Blank  rows = not tested)  STRENGTH: not tested today  LOWER EXTREMITY MMT:   MMT Right eval Left eval  Hip flexion    Hip abduction    Hip adduction    Hip internal rotation    Hip external rotation    Knee flexion    Knee extension    Ankle dorsiflexion    Ankle plantarflexion    Ankle inversion    Ankle eversion    (Blank rows = not tested)  BED MOBILITY:  Sit to supine Modified independence Supine to sit Modified independence  TRANSFERS: Assistive device utilized:  walking stick   Sit to stand: SBA Stand to sit: SBA Chair to chair: SBA  GAIT: Gait pattern: decreased arm swing- Right, decreased arm swing- Left, decreased step length- Right, and decreased step length- Left Distance walked: 80 ft Assistive device utilized:  walking stick Level of assistance: SBA Comments: slow gait speed; short step length  FUNCTIONAL TESTS:  5 times sit to stand: 47.28 using arms to assist up  PATIENT SURVEYS:  DHI 60; severe handicap  VESTIBULAR ASSESSMENT:  GENERAL OBSERVATION: no glassess   SYMPTOM BEHAVIOR:  Subjective history: see above  Non-Vestibular symptoms: changes in hearing  Type of dizziness: Imbalance (Disequilibrium) and Lightheadedness/Faint  Frequency: varies  Duration: varies  Aggravating factors: Induced by position change: supine to sit, Induced by motion: bending down to the ground and turning head quickly, Worse outside or in busy environment, and Moving eyes  Relieving factors: no known relieving factors  Progression of symptoms: unchanged  OCULOMOTOR EXAM:  Ocular Alignment: normal  Ocular ROM: No Limitations  Spontaneous Nystagmus: absent  Gaze-Induced Nystagmus: absent  Smooth Pursuits: intact  Saccades:   Convergence/Divergence: not tested  VESTIBULAR - OCULAR REFLEX:   Slow VOR: Positive Right  VOR Cancellation:   Head-Impulse Test: HIT Right: positive HIT Left: negative  Dynamic Visual Acuity:  not tested   POSITIONAL TESTING: Right  Dix-Hallpike: no nystagmus Left Dix-Hallpike: no nystagmus  MOTION SENSITIVITY:  Motion Sensitivity Quotient Intensity: 0 = none, 1 = Lightheaded, 2 = Mild, 3 = Moderate, 4 = Severe, 5 = Vomiting  Intensity  1. Sitting to supine 0  2. Supine to L side   3. Supine to R side   4. Supine to sitting 1  5. L Hallpike-Dix 0  6. Up from L  1  7. R Hallpike-Dix 0  8. Up from R  1  9. Sitting, head tipped to L knee 0  10. Head up from L knee 0  11. Sitting, head tipped to R knee 0  12. Head up from R knee 0  13. Sitting head turns x5 0  14.Sitting head nods x5 0  15. In stance, 180 turn to L    16. In stance, 180 turn to R     OTHOSTATICS: not done  FUNCTIONAL GAIT:    VESTIBULAR TREATMENT:  DATE:  04/21/22 Standing: Head turns and nods x 10 each with eyes on target Small BOS with head turns and nods with eyes on target x 10 Tandem stance with head turns and nods with eyes on target x 10 each way 2 targets standing and head turns x 10 Rhythmic stabilization 2 x 20" Rhythmic stabilization in tandem stance x 20" each way Heel raises x 10 with both UE assist Hip abduction x 10 each with both UE assist Weighted ball trunk rotation with eyes following x 10 each   Seated: Hamstring/calf stretch with strap 5 x 20"    04/16/22 Seated:  Horizontal saccades by reading numbers x 1 minute   X1 horizontal viewing by identifying colors x 1 minute X1 vertical viewing by identifying shapes x 1 minute X1 horizontal viewing by identifying animal silhouettes x 1 minute X1 vertical viewing by counting shapes/colors x 1 minute Calf stretch with a strap on B x 30" x 3 Standing, firm surface, eyes, open, normal BOS:  Rhythmic stabilization ant/post x 3" x 10 x 2 on each side alternating  Reaching in various direction x 1'  Heel raises x 10 x 2  Marches x 10 x 2  Mini squats x 3" x 10 x  2  04/09/22 Seated:  Horizontal saccades by reading numbers x 1 minute x 2 reps  Horizontal saccades by identifying shapes x 1 minute  X1 horizontal viewing by identifying colors x 1 minute X1 vertical viewing by identifying shapes x 1 minute X1 horizontal viewing by identifying animal silhouettes x 1 minute X1 vertical viewing by counting shapes/colors x 1 minute Calf stretch with a strap on B x 30" x 3 Standing, firm surface, eyes, open, normal BOS:  Rhythmic stabilization ant/post x 3" x 10 x 2 on each side alternating  Reaching in various direction x 1'  04/07/22 Seated:  Horizontal saccades by reading numbers x 1 minute x 3 reps  X1 horizontal viewing by identifying colors x 1 minute X1 vertical viewing by identifying shapes x 1 minute Standing, firm surface, eyes, open, normal BOS:  Rhythmic stabilization ant/post x 3" x 10 x 2 on each side alternating  Reaching in various direction   03/30/22 physical therapy evaluation and HEP instruction  [place] Repositioning:   Gaze Adaptation:  x1 Viewing Horizontal: Position: sitting and Reps: 10 and x1 Viewing Vertical:  Position: sitting and Reps: 10 Habituation:  Seated Vertical Head Movements: number of reps: 10 and Seated Horizontal Head Movements: number of reps: 10 Other: issued for HEP  PATIENT EDUCATION: Education details: 04/16/22: updated HEP  Person educated: Patient Education method: Explanation, Demonstration, and Handouts Education comprehension: verbalized understanding, returned demonstration, verbal cues required, and tactile cues required   HOME EXERCISE PROGRAM:  Access Code: [email or web address] URL: [email or web address] Date: 03/30/2022 Prepared by: [facility]  04/16/22 - Mini Squat with Counter Support  - 1-2 x daily - 7 x weekly - 2 sets - 10 reps - 3 hold - Heel Raises with Counter Support  - 1-2 x daily - 7 x weekly - 2 sets - 10 reps 04/09/22 - Seated Calf Stretch with Strap  - 1-2 x daily - 7 x  weekly - 3 reps - 30 hold Exercises - Seated Gaze Stabilization with Head Nod  - 3 x daily - 7 x weekly - 1 sets - 10-20 reps - Seated Gaze Stabilization with Head Rotation  - 3 x daily - 7 x weekly - 1 sets - 10-20 reps  GOALS: Goals reviewed with patient? No  SHORT TERM GOALS: Target date: 04/13/2022  patient will be independent with initial HEP  Baseline: Goal status: MET  2.  Patient will self report 30% improvement to improve tolerance for functional activity   Baseline:  Goal status: IN PROGRESS   LONG TERM GOALS: Target date: 04/27/2022  Patient will be independent in self management strategies to improve quality of life and functional outcomes.  Baseline:  Goal status: IN PROGRESS  2.  Patient will self report 50% improvement to improve tolerance for functional activity   Baseline:  Goal status: IN PROGRESS  3.  Patient will improve score on DHI by 20 points to demostrate decreased dizziness with functional tasks (goal 40) Baseline: 60  Goal status: IN PROGRESS  4.  Patient will remain free of falls Baseline:  Goal status: IN PROGRESS  5.  Patient will improve 5 times sit to stand score from 47.28  sec to 30 sec to demonstrate improved functional mobility and increased lower extremity strength.  Baseline:  Goal status: IN PROGRESS   ASSESSMENT:  CLINICAL IMPRESSION Patient with tendency to lean posteriorly with rhythmic stabilization exercise and states when he does fall it is most likely he will fall backwards.  Minimal complaints of dizziness with treatment today; most challenged with balance activities. Continues to walk with "foot slap" discussed a foot drop brace with patient? He will consider. Patient will benefit from continued skilled therapy services to address deficits and promote return to optimal function.      Eval:Patient is a 87 y.o. male who was seen today for physical therapy evaluation and treatment for dizziness. Patient presents to  physical therapy with complaint of light headed feelings with position changes, turning his head and difficulty with his balance. Patient demonstrates decreased strength, balance deficits and gait abnormalities which are negatively impacting patient ability to perform ADLs and functional mobility tasks.  Patient demonstrates increased vestibular symptoms with VOR testing which is negatively impacting patient ability to perform ADLs and functional mobility tasks. Patient will benefit from skilled physical therapy services to address these deficits to improve level of function with ADLs, functional mobility tasks, and reduce risk for falls.     OBJECTIVE IMPAIRMENTS: Abnormal gait, decreased activity tolerance, decreased balance, decreased endurance, decreased mobility, difficulty walking, decreased ROM, decreased strength, dizziness, hypomobility, increased fascial restrictions, impaired perceived functional ability, impaired flexibility, and pain.   ACTIVITY LIMITATIONS: carrying, lifting, bending, standing, stairs, bathing, toileting, dressing, locomotion level, and caring for others  PARTICIPATION LIMITATIONS: meal prep, cleaning, laundry, driving, shopping, community activity, occupation, and yard work  PERSONAL FACTORS: 1 comorbidity: neuropathy  are also affecting patient's functional outcome.   REHAB POTENTIAL: Good  CLINICAL DECISION MAKING: Stable/uncomplicated  EVALUATION COMPLEXITY: Moderate   PLAN:  PT FREQUENCY: 2x/week  PT DURATION: 4 weeks  PLANNED INTERVENTIONS: Therapeutic exercises, Therapeutic activity, Neuromuscular re-education, Balance training, Gait training, Patient/Family education, Joint manipulation, Joint mobilization, Stair training, Orthotic/Fit training, DME instructions, Aquatic Therapy, Dry Needling, Electrical stimulation, Spinal manipulation, Spinal mobilization, Cryotherapy, Moist heat, Compression bandaging, scar mobilization, Splintting, Taping, Traction,  Ultrasound, Ionotophoresis 65m/ml Dexamethasone, and Manual therapy   PLAN FOR NEXT SESSION: Continue POC and may progress as tolerated with emphasis on balance habituation, and gaze stabilization   9:51 AM, 04/21/22 Benjamin Yang Small Benjamin Yang MPT [facility] [place] #[phone]:[phone]

## 2022-04-23 ENCOUNTER — Ambulatory Visit (HOSPITAL_COMMUNITY): Payer: Medicare Other

## 2022-04-23 DIAGNOSIS — R262 Difficulty in walking, not elsewhere classified: Secondary | ICD-10-CM

## 2022-04-23 DIAGNOSIS — R42 Dizziness and giddiness: Secondary | ICD-10-CM

## 2022-04-23 NOTE — Therapy (Signed)
OUTPATIENT PHYSICAL THERAPY VESTIBULAR TREATMENT     Patient Name: Benjamin Yang MRN: [id number] DOB:01-May-1935, 87 y.o., male Today's Date: 04/23/2022  END OF SESSION:  PT End of Session - 04/23/22 1031     Visit Number 7    Number of Visits 8    Date for PT Re-Evaluation 04/27/22    Authorization Type Medicare    Progress Note Due on Visit 10    PT Start Time 1031    PT Stop Time 1112    PT Time Calculation (min) 41 min    Equipment Utilized During Treatment Gait belt    Activity Tolerance Patient limited by fatigue            Past Medical History:  Diagnosis Date   Cancer ([place])    bladder   Hypertension    Prostatitis    Past Surgical History:  Procedure Laterality Date   BACK SURGERY     CHOLECYSTECTOMY N/A 01/06/2021   Procedure: LAPAROSCOPIC CHOLECYSTECTOMY;  Surgeon: Aviva Signs, MD;  Location: [facility];  Service: General;  Laterality: N/A;   CYSTOSCOPY N/A 09/15/2012   Procedure: CYSTOSCOPY;  Surgeon: Alexis Frock, MD;  Location: [facility];  Service: Urology;  Laterality: N/A;   LYMPHADENECTOMY Bilateral 09/15/2012   Procedure:  Bilateral Pelvic Lymph Node Dissection;  Surgeon: Alexis Frock, MD;  Location: [facility];  Service: Urology;  Laterality: Bilateral;   ROBOT ASSISTED LAPAROSCOPIC COMPLETE CYSTECT ILEAL CONDUIT N/A 09/15/2012   Procedure: Robotic Cystoprostatectomy, Open Ileal Conduit Diversion,  Indocyanine Green Dye Injection into Bladder ;  Surgeon: Alexis Frock, MD;  Location: [facility];  Service: Urology;  Laterality: N/A;  Robotic Cystoprostatectomy, Bilateral Pelvic Lymph Node Dissection, Open Ileal Conduit Diversion, Cysto, Indocyanine Green Dye Injection into Bladder     ROBOTIC ASSISTED LAPAROSCOPIC LYSIS OF ADHESION N/A 09/15/2012   Procedure: ROBOTIC ASSISTED LAPAROSCOPIC LYSIS OF ADHESION;  Surgeon: Alexis Frock, MD;  Location: [facility];  Service: Urology;  Laterality: N/A;   Patient Active Problem List   Diagnosis Date Noted   Anxiety  03/21/2022   Hyperglycemia 03/21/2022   Hydronephrosis with obstructing calculus 03/20/2022   Empyema of gallbladder    Acute cholecystitis 01/02/2021   Severe sepsis ([place])    Sepsis ([place]) 04/13/2018   Lobar pneumonia ([place]) 04/13/2018   UTI (urinary tract infection) 04/13/2018   Essential hypertension 04/13/2018    PCP: Dr. Manuella Ghazi REFERRING PROVIDER: Raylene Miyamoto, MD  REFERRING DIAG: dizziness  THERAPY DIAG:  Dizziness and giddiness  Difficulty in walking, not elsewhere classified  ONSET DATE: years; worsened lately  Rationale for Evaluation and Treatment: Rehabilitation  SUBJECTIVE:   SUBJECTIVE STATEMENT: Walked about 15- 20 minutes yesterday outdoors level driveway; made him tired but no loss of balance or falls; no dizzy spells since last visit.  Ordered a floor bike to do some exercise at home. Patient has also printed of large print letters to practice his gaze stabilization exercise at home.   PERTINENT HISTORY: gallbladder removed 2022; had home health  PAIN:  Are you having pain? No  PRECAUTIONS: Fall  WEIGHT BEARING RESTRICTIONS: No  FALLS: Has patient fallen in last 6 months? Yes. Number of falls 2  LIVING ENVIRONMENT: Lives with: lives with their spouse Lives in: House/apartment Stairs: Yes: External: 1 steps; none Has following equipment at home: Walker - 2 wheeled, shower chair, Grab bars, and walking stick  PLOF: Independent with household mobility with device  PATIENT GOALS: be less tentative; have better balance; be able to walk  out in the yard; walk better  OBJECTIVE:   DIAGNOSTIC FINDINGS: none in EPIC; patient reports Manuella Ghazi did CT/MRI of head all clear  COGNITION: Overall cognitive status: Within functional limits for tasks assessed   SENSATION: Neuropathy in both foot especially left foot  MUSCLE TONE:  wfl   POSTURE:  rounded shoulders and forward head  Cervical ROM:  all wfl  Active A/PROM (deg) eval  Flexion   Extension    Right lateral flexion   Left lateral flexion   Right rotation   Left rotation   (Blank rows = not tested)  STRENGTH: not tested today  LOWER EXTREMITY MMT:   MMT Right eval Left eval  Hip flexion    Hip abduction    Hip adduction    Hip internal rotation    Hip external rotation    Knee flexion    Knee extension    Ankle dorsiflexion    Ankle plantarflexion    Ankle inversion    Ankle eversion    (Blank rows = not tested)  BED MOBILITY:  Sit to supine Modified independence Supine to sit Modified independence  TRANSFERS: Assistive device utilized:  walking stick   Sit to stand: SBA Stand to sit: SBA Chair to chair: SBA  GAIT: Gait pattern: decreased arm swing- Right, decreased arm swing- Left, decreased step length- Right, and decreased step length- Left Distance walked: 80 ft Assistive device utilized:  walking stick Level of assistance: SBA Comments: slow gait speed; short step length  FUNCTIONAL TESTS:  5 times sit to stand: 47.28 using arms to assist up  PATIENT SURVEYS:  DHI 60; severe handicap  VESTIBULAR ASSESSMENT:  GENERAL OBSERVATION: no glassess   SYMPTOM BEHAVIOR:  Subjective history: see above  Non-Vestibular symptoms: changes in hearing  Type of dizziness: Imbalance (Disequilibrium) and Lightheadedness/Faint  Frequency: varies  Duration: varies  Aggravating factors: Induced by position change: supine to sit, Induced by motion: bending down to the ground and turning head quickly, Worse outside or in busy environment, and Moving eyes  Relieving factors: no known relieving factors  Progression of symptoms: unchanged  OCULOMOTOR EXAM:  Ocular Alignment: normal  Ocular ROM: No Limitations  Spontaneous Nystagmus: absent  Gaze-Induced Nystagmus: absent  Smooth Pursuits: intact  Saccades:   Convergence/Divergence: not tested  VESTIBULAR - OCULAR REFLEX:   Slow VOR: Positive Right  VOR Cancellation:   Head-Impulse Test: HIT Right:  positive HIT Left: negative  Dynamic Visual Acuity:  not tested   POSITIONAL TESTING: Right Dix-Hallpike: no nystagmus Left Dix-Hallpike: no nystagmus  MOTION SENSITIVITY:  Motion Sensitivity Quotient Intensity: 0 = none, 1 = Lightheaded, 2 = Mild, 3 = Moderate, 4 = Severe, 5 = Vomiting  Intensity  1. Sitting to supine 0  2. Supine to L side   3. Supine to R side   4. Supine to sitting 1  5. L Hallpike-Dix 0  6. Up from L  1  7. R Hallpike-Dix 0  8. Up from R  1  9. Sitting, head tipped to L knee 0  10. Head up from L knee 0  11. Sitting, head tipped to R knee 0  12. Head up from R knee 0  13. Sitting head turns x5 0  14.Sitting head nods x5 0  15. In stance, 180 turn to L    16. In stance, 180 turn to R     OTHOSTATICS: not done  FUNCTIONAL GAIT:    VESTIBULAR TREATMENT:  DATE:  04/23/22 Standing: // heel raises x 10; 1 UE assist SLS 3 x 10" each with 1 UE assist Tandem stance no UE assist x 30" each; CGA for safety Small BOS on black foam with head turns no UE assist 2 x 1' Sit to stand 2 x 5 hands on thighs to assist to pushing up 4" step ups x 5 each no UE assist  Seated: Hamstring stretch 10 x 10"     04/21/22 Standing: Head turns and nods x 10 each with eyes on target Small BOS with head turns and nods with eyes on target x 10 Tandem stance with head turns and nods with eyes on target x 10 each way 2 targets standing and head turns x 10 Rhythmic stabilization 2 x 20" Rhythmic stabilization in tandem stance x 20" each way Heel raises x 10 with both UE assist Hip abduction x 10 each with both UE assist Weighted ball trunk rotation with eyes following x 10 each   Seated: Hamstring/calf stretch with strap 5 x 20"    04/16/22 Seated:  Horizontal saccades by reading numbers x 1 minute   X1 horizontal viewing by identifying colors x 1 minute X1 vertical  viewing by identifying shapes x 1 minute X1 horizontal viewing by identifying animal silhouettes x 1 minute X1 vertical viewing by counting shapes/colors x 1 minute Calf stretch with a strap on B x 30" x 3 Standing, firm surface, eyes, open, normal BOS:  Rhythmic stabilization ant/post x 3" x 10 x 2 on each side alternating  Reaching in various direction x 1'  Heel raises x 10 x 2  Marches x 10 x 2  Mini squats x 3" x 10 x 2  04/09/22 Seated:  Horizontal saccades by reading numbers x 1 minute x 2 reps  Horizontal saccades by identifying shapes x 1 minute  X1 horizontal viewing by identifying colors x 1 minute X1 vertical viewing by identifying shapes x 1 minute X1 horizontal viewing by identifying animal silhouettes x 1 minute X1 vertical viewing by counting shapes/colors x 1 minute Calf stretch with a strap on B x 30" x 3 Standing, firm surface, eyes, open, normal BOS:  Rhythmic stabilization ant/post x 3" x 10 x 2 on each side alternating  Reaching in various direction x 1'  04/07/22 Seated:  Horizontal saccades by reading numbers x 1 minute x 3 reps  X1 horizontal viewing by identifying colors x 1 minute X1 vertical viewing by identifying shapes x 1 minute Standing, firm surface, eyes, open, normal BOS:  Rhythmic stabilization ant/post x 3" x 10 x 2 on each side alternating  Reaching in various direction   03/30/22 physical therapy evaluation and HEP instruction  Canalith Repositioning:   Gaze Adaptation:  x1 Viewing Horizontal: Position: sitting and Reps: 10 and x1 Viewing Vertical:  Position: sitting and Reps: 10 Habituation:  Seated Vertical Head Movements: number of reps: 10 and Seated Horizontal Head Movements: number of reps: 10 Other: issued for HEP  PATIENT EDUCATION: Education details: 04/16/22: updated HEP  Person educated: Patient Education method: Explanation, Demonstration, and Handouts Education comprehension: verbalized understanding, returned  demonstration, verbal cues required, and tactile cues required   HOME EXERCISE PROGRAM:  Access Code: [email or web address] URL: [email or web address] Date: 03/30/2022 Prepared by: [facility]  04/16/22 - Mini Squat with Counter Support  - 1-2 x daily - 7 x weekly - 2 sets - 10 reps - 3 hold - Heel Raises with Counter Support  - 1-2 x  daily - 7 x weekly - 2 sets - 10 reps 04/09/22 - Seated Calf Stretch with Strap  - 1-2 x daily - 7 x weekly - 3 reps - 30 hold Exercises - Seated Gaze Stabilization with Head Nod  - 3 x daily - 7 x weekly - 1 sets - 10-20 reps - Seated Gaze Stabilization with Head Rotation  - 3 x daily - 7 x weekly - 1 sets - 10-20 reps  GOALS: Goals reviewed with patient? No  SHORT TERM GOALS: Target date: 04/13/2022  patient will be independent with initial HEP  Baseline: Goal status: MET  2.  Patient will self report 30% improvement to improve tolerance for functional activity   Baseline:  Goal status: IN PROGRESS   LONG TERM GOALS: Target date: 04/27/2022  Patient will be independent in self management strategies to improve quality of life and functional outcomes.  Baseline:  Goal status: IN PROGRESS  2.  Patient will self report 50% improvement to improve tolerance for functional activity   Baseline:  Goal status: IN PROGRESS  3.  Patient will improve score on DHI by 20 points to demostrate decreased dizziness with functional tasks (goal 40) Baseline: 60  Goal status: IN PROGRESS  4.  Patient will remain free of falls Baseline:  Goal status: IN PROGRESS  5.  Patient will improve 5 times sit to stand score from 47.28  sec to 30 sec to demonstrate improved functional mobility and increased lower extremity strength.  Baseline:  Goal status: IN PROGRESS   ASSESSMENT:  CLINICAL IMPRESSION Today's session continued to focus on lower extremity strengthening and balance; challenging vestibular system and balance.  Good challenge today with  balance exercise on foam and with step ups; step ups particularly difficult for patient.  He continues with leg weakness and decreased joint awareness of the feet and ankles so continues with fall risk however he is working hard in therapy and is making slow progress.  Patient will benefit from continued skilled therapy services to address deficits and promote return to optimal function.      Eval:Patient is a 87 y.o. male who was seen today for physical therapy evaluation and treatment for dizziness. Patient presents to physical therapy with complaint of light headed feelings with position changes, turning his head and difficulty with his balance. Patient demonstrates decreased strength, balance deficits and gait abnormalities which are negatively impacting patient ability to perform ADLs and functional mobility tasks.  Patient demonstrates increased vestibular symptoms with VOR testing which is negatively impacting patient ability to perform ADLs and functional mobility tasks. Patient will benefit from skilled physical therapy services to address these deficits to improve level of function with ADLs, functional mobility tasks, and reduce risk for falls.     OBJECTIVE IMPAIRMENTS: Abnormal gait, decreased activity tolerance, decreased balance, decreased endurance, decreased mobility, difficulty walking, decreased ROM, decreased strength, dizziness, hypomobility, increased fascial restrictions, impaired perceived functional ability, impaired flexibility, and pain.   ACTIVITY LIMITATIONS: carrying, lifting, bending, standing, stairs, bathing, toileting, dressing, locomotion level, and caring for others  PARTICIPATION LIMITATIONS: meal prep, cleaning, laundry, driving, shopping, community activity, occupation, and yard work  PERSONAL FACTORS: 1 comorbidity: neuropathy  are also affecting patient's functional outcome.   REHAB POTENTIAL: Good  CLINICAL DECISION MAKING: Stable/uncomplicated  EVALUATION  COMPLEXITY: Moderate   PLAN:  PT FREQUENCY: 2x/week  PT DURATION: 4 weeks  PLANNED INTERVENTIONS: Therapeutic exercises, Therapeutic activity, Neuromuscular re-education, Balance training, Gait training, Patient/Family education, Joint manipulation, Joint mobilization, Stair training,  Orthotic/Fit training, DME instructions, Aquatic Therapy, Dry Needling, Electrical stimulation, Spinal manipulation, Spinal mobilization, Cryotherapy, Moist heat, Compression bandaging, scar mobilization, Splintting, Taping, Traction, Ultrasound, Ionotophoresis '4mg'$ /ml Dexamethasone, and Manual therapy   PLAN FOR NEXT SESSION: reassess next visit  11:22 AM, 04/23/22 Nakshatra Klose Small Mykell Rawl MPT [facility] [place] [phone] [id number]

## 2022-04-27 ENCOUNTER — Ambulatory Visit (HOSPITAL_COMMUNITY): Payer: Medicare Other

## 2022-04-27 DIAGNOSIS — R42 Dizziness and giddiness: Secondary | ICD-10-CM

## 2022-04-27 DIAGNOSIS — R262 Difficulty in walking, not elsewhere classified: Secondary | ICD-10-CM | POA: Diagnosis not present

## 2022-04-27 NOTE — Therapy (Signed)
OUTPATIENT PHYSICAL THERAPY VESTIBULAR TREATMENT/ PROGRESS NOTE Progress Note Reporting Period 03/30/2022 to 04/27/2022  See note below for Objective Data and Assessment of Progress/Goals.         Patient Name: Benjamin Yang MRN: [id number] DOB:[birth year], 87 y.o., male Today's Date: 04/27/2022  END OF SESSION:  PT End of Session - 04/27/22 0733     Visit Number 8    Number of Visits 16    Date for PT Re-Evaluation 05/25/22    Authorization Type Medicare    Progress Note Due on Visit 10    PT Start Time 0728    PT Stop Time 0812    PT Time Calculation (min) 44 min    Equipment Utilized During Treatment Gait belt    Activity Tolerance Patient limited by fatigue            Past Medical History:  Diagnosis Date   Cancer ([place])    bladder   Hypertension    Prostatitis    Past Surgical History:  Procedure Laterality Date   BACK SURGERY     CHOLECYSTECTOMY N/A 01/06/2021   Procedure: LAPAROSCOPIC CHOLECYSTECTOMY;  Surgeon: Aviva Signs, MD;  Location: [facility];  Service: General;  Laterality: N/A;   CYSTOSCOPY N/A 09/15/2012   Procedure: CYSTOSCOPY;  Surgeon: Alexis Frock, MD;  Location: [facility];  Service: Urology;  Laterality: N/A;   LYMPHADENECTOMY Bilateral 09/15/2012   Procedure:  Bilateral Pelvic Lymph Node Dissection;  Surgeon: Alexis Frock, MD;  Location: [facility];  Service: Urology;  Laterality: Bilateral;   ROBOT ASSISTED LAPAROSCOPIC COMPLETE CYSTECT ILEAL CONDUIT N/A 09/15/2012   Procedure: Robotic Cystoprostatectomy, Open Ileal Conduit Diversion,  Indocyanine Green Dye Injection into Bladder ;  Surgeon: Alexis Frock, MD;  Location: [facility];  Service: Urology;  Laterality: N/A;  Robotic Cystoprostatectomy, Bilateral Pelvic Lymph Node Dissection, Open Ileal Conduit Diversion, Cysto, Indocyanine Green Dye Injection into Bladder     ROBOTIC ASSISTED LAPAROSCOPIC LYSIS OF ADHESION N/A 09/15/2012   Procedure: ROBOTIC ASSISTED LAPAROSCOPIC LYSIS OF ADHESION;   Surgeon: Alexis Frock, MD;  Location: [facility];  Service: Urology;  Laterality: N/A;   Patient Active Problem List   Diagnosis Date Noted   Anxiety 03/21/2022   Hyperglycemia 03/21/2022   Hydronephrosis with obstructing calculus 03/20/2022   Empyema of gallbladder    Acute cholecystitis 01/02/2021   Severe sepsis ([place])    Sepsis ([place]) 04/13/2018   Lobar pneumonia ([place]) 04/13/2018   UTI (urinary tract infection) 04/13/2018   Essential hypertension 04/13/2018    PCP: Dr. Manuella Ghazi REFERRING PROVIDER: Raylene Miyamoto, MD  REFERRING DIAG: dizziness  THERAPY DIAG:  Dizziness and giddiness - Plan: PT plan of care cert/re-cert  Difficulty in walking, not elsewhere classified - Plan: PT plan of care cert/re-cert  ONSET DATE: years; worsened lately  Rationale for Evaluation and Treatment: Rehabilitation  SUBJECTIVE:   SUBJECTIVE STATEMENT: Patient states he still occasionally gets some very mild and quick passing dizziness when turning in bed.  No new falls;  states his dizziness symptoms are "80% better"; but his balance issues are unchanged.  Feels balance is now his biggest issue  PERTINENT HISTORY: gallbladder removed 2022; had home health  PAIN:  Are you having pain? No  PRECAUTIONS: Fall  WEIGHT BEARING RESTRICTIONS: No  FALLS: Has patient fallen in last 6 months? Yes. Number of falls 2  LIVING ENVIRONMENT: Lives with: lives with their spouse Lives in: House/apartment Stairs: Yes: External: 1 steps; none Has following equipment at home: Gilford Rile - 2 wheeled,  shower chair, Grab bars, and walking stick  PLOF: Independent with household mobility with device  PATIENT GOALS: be less tentative; have better balance; be able to walk out in the yard; walk better  OBJECTIVE:   DIAGNOSTIC FINDINGS: none in EPIC; patient reports Manuella Ghazi did CT/MRI of head all clear  COGNITION: Overall cognitive status: Within functional limits for tasks assessed   SENSATION: Neuropathy in both  foot especially left foot  MUSCLE TONE:  wfl   POSTURE:  rounded shoulders and forward head  Cervical ROM:  all wfl  Active A/PROM (deg) eval  Flexion   Extension   Right lateral flexion   Left lateral flexion   Right rotation   Left rotation   (Blank rows = not tested)  STRENGTH: not tested today  LOWER EXTREMITY MMT:   MMT Right eval Left eval  Hip flexion    Hip abduction    Hip adduction    Hip internal rotation    Hip external rotation    Knee flexion    Knee extension    Ankle dorsiflexion    Ankle plantarflexion    Ankle inversion    Ankle eversion    (Blank rows = not tested)  BED MOBILITY:  Sit to supine Modified independence Supine to sit Modified independence  TRANSFERS: Assistive device utilized:  walking stick   Sit to stand: SBA Stand to sit: SBA Chair to chair: SBA  GAIT: Gait pattern: decreased arm swing- Right, decreased arm swing- Left, decreased step length- Right, and decreased step length- Left Distance walked: 80 ft Assistive device utilized:  walking stick Level of assistance: SBA Comments: slow gait speed; short step length  FUNCTIONAL TESTS:  5 times sit to stand: 47.28 using arms to assist up  PATIENT SURVEYS:  DHI 60; severe handicap  VESTIBULAR ASSESSMENT:  GENERAL OBSERVATION: no glassess   SYMPTOM BEHAVIOR:  Subjective history: see above  Non-Vestibular symptoms: changes in hearing  Type of dizziness: Imbalance (Disequilibrium) and Lightheadedness/Faint  Frequency: varies  Duration: varies  Aggravating factors: Induced by position change: supine to sit, Induced by motion: bending down to the ground and turning head quickly, Worse outside or in busy environment, and Moving eyes  Relieving factors: no known relieving factors  Progression of symptoms: unchanged  OCULOMOTOR EXAM:  Ocular Alignment: normal  Ocular ROM: No Limitations  Spontaneous Nystagmus: absent  Gaze-Induced Nystagmus: absent  Smooth  Pursuits: intact  Saccades:   Convergence/Divergence: not tested  VESTIBULAR - OCULAR REFLEX:   Slow VOR: Positive Right  VOR Cancellation:   Head-Impulse Test: HIT Right: positive HIT Left: negative  Dynamic Visual Acuity:  not tested   POSITIONAL TESTING: Right Dix-Hallpike: no nystagmus Left Dix-Hallpike: no nystagmus  MOTION SENSITIVITY:  Motion Sensitivity Quotient Intensity: 0 = none, 1 = Lightheaded, 2 = Mild, 3 = Moderate, 4 = Severe, 5 = Vomiting  Intensity  1. Sitting to supine 0  2. Supine to L side   3. Supine to R side   4. Supine to sitting 1  5. L Hallpike-Dix 0  6. Up from L  1  7. R Hallpike-Dix 0  8. Up from R  1  9. Sitting, head tipped to L knee 0  10. Head up from L knee 0  11. Sitting, head tipped to R knee 0  12. Head up from R knee 0  13. Sitting head turns x5 0  14.Sitting head nods x5 0  15. In stance, 180 turn to L  16. In stance, 180 turn to R     OTHOSTATICS: not done  FUNCTIONAL GAIT:    VESTIBULAR TREATMENT:                                                                                                   DATE:  04/27/22 Progress note DHI 58 (54 + severe handicap) 5 x sit to stand 30.09 sec using hands to push up SLS 4" each Standing: 4" step ups x 5 each foot no UE assist Hip vectors with 1 UE assist x 5 each     04/23/22 Standing: // heel raises x 10; 1 UE assist SLS 3 x 10" each with 1 UE assist Tandem stance no UE assist x 30" each; CGA for safety Small BOS on black foam with head turns no UE assist 2 x 1' Sit to stand 2 x 5 hands on thighs to assist to pushing up 4" step ups x 5 each no UE assist  Seated: Hamstring stretch 10 x 10"     04/21/22 Standing: Head turns and nods x 10 each with eyes on target Small BOS with head turns and nods with eyes on target x 10 Tandem stance with head turns and nods with eyes on target x 10 each way 2 targets standing and head turns x 10 Rhythmic stabilization 2 x  20" Rhythmic stabilization in tandem stance x 20" each way Heel raises x 10 with both UE assist Hip abduction x 10 each with both UE assist Weighted ball trunk rotation with eyes following x 10 each   Seated: Hamstring/calf stretch with strap 5 x 20"    04/16/22 Seated:  Horizontal saccades by reading numbers x 1 minute   X1 horizontal viewing by identifying colors x 1 minute X1 vertical viewing by identifying shapes x 1 minute X1 horizontal viewing by identifying animal silhouettes x 1 minute X1 vertical viewing by counting shapes/colors x 1 minute Calf stretch with a strap on B x 30" x 3 Standing, firm surface, eyes, open, normal BOS:  Rhythmic stabilization ant/post x 3" x 10 x 2 on each side alternating  Reaching in various direction x 1'  Heel raises x 10 x 2  Marches x 10 x 2  Mini squats x 3" x 10 x 2  04/09/22 Seated:  Horizontal saccades by reading numbers x 1 minute x 2 reps  Horizontal saccades by identifying shapes x 1 minute  X1 horizontal viewing by identifying colors x 1 minute X1 vertical viewing by identifying shapes x 1 minute X1 horizontal viewing by identifying animal silhouettes x 1 minute X1 vertical viewing by counting shapes/colors x 1 minute Calf stretch with a strap on B x 30" x 3 Standing, firm surface, eyes, open, normal BOS:  Rhythmic stabilization ant/post x 3" x 10 x 2 on each side alternating  Reaching in various direction x 1'  04/07/22 Seated:  Horizontal saccades by reading numbers x 1 minute x 3 reps  X1 horizontal viewing by identifying colors x 1 minute X1 vertical viewing by identifying shapes x 1 minute Standing, firm  surface, eyes, open, normal BOS:  Rhythmic stabilization ant/post x 3" x 10 x 2 on each side alternating  Reaching in various direction   03/30/22 physical therapy evaluation and HEP instruction  Canalith Repositioning:   Gaze Adaptation:  x1 Viewing Horizontal: Position: sitting and Reps: 10 and x1 Viewing  Vertical:  Position: sitting and Reps: 10 Habituation:  Seated Vertical Head Movements: number of reps: 10 and Seated Horizontal Head Movements: number of reps: 10 Other: issued for HEP  PATIENT EDUCATION: Education details: 04/16/22: updated HEP  Person educated: Patient Education method: Explanation, Demonstration, and Handouts Education comprehension: verbalized understanding, returned demonstration, verbal cues required, and tactile cues required   HOME EXERCISE PROGRAM:  Access Code: [email or web address] URL: [email or web address] Date: 03/30/2022 Prepared by: [facility]  04/16/22 - Mini Squat with Counter Support  - 1-2 x daily - 7 x weekly - 2 sets - 10 reps - 3 hold - Heel Raises with Counter Support  - 1-2 x daily - 7 x weekly - 2 sets - 10 reps 04/09/22 - Seated Calf Stretch with Strap  - 1-2 x daily - 7 x weekly - 3 reps - 30 hold Exercises - Seated Gaze Stabilization with Head Nod  - 3 x daily - 7 x weekly - 1 sets - 10-20 reps - Seated Gaze Stabilization with Head Rotation  - 3 x daily - 7 x weekly - 1 sets - 10-20 reps  GOALS: Goals reviewed with patient? No  SHORT TERM GOALS: Target date: 04/13/2022  patient will be independent with initial HEP  Baseline: Goal status: MET  2.  Patient will self report 30% improvement to improve tolerance for functional activity   Baseline:  Goal status: MET   LONG TERM GOALS: Target date: 04/27/2022  Patient will be independent in self management strategies to improve quality of life and functional outcomes.  Baseline:  Goal status: IN PROGRESS  2.  Patient will self report 50% improvement to improve tolerance for functional activity   Baseline:  Goal status: IN PROGRESS  3.  Patient will improve score on DHI by 20 points to demostrate decreased dizziness with functional tasks (goal 40) Baseline: 60; 58 04/27/22  Goal status: IN PROGRESS  4.  Patient will remain free of falls Baseline: 2/27 no new falls Goal  status: IN PROGRESS  5.  Patient will improve 5 times sit to stand score from 47.28  sec to 30 sec to demonstrate improved functional mobility and increased lower extremity strength.  Baseline: 04/27/22 30.09 sec using hands to assist up to standing Goal status: IN PROGRESS  6. (Goal added 04/27/22) patient will be able to stand SLS x 10" each leg to demonstrate improved functional balance and decreased fall risk.  Baseline: 4 sec each leg  Goal status: in progress   ASSESSMENT:  CLINICAL IMPRESSION Progress note today.  DHI score 58 today; slight improvement. Good improvement with sit to stand test; challenged with SLS; patient seems to struggle mostly with balance now; added a goal for SLS and updated HEP.  Still has difficulty with maintaining balance with 4" step ups needing occasional minimal assist from therapist to avoid loss of balance. Good challenge with hip vectors; has trouble maintaining upright posturing and balance.  Recommend continued skilled therapy to address remaining unmet and partially met goals.  Patient will benefit from continued skilled therapy services to address deficits and promote return to optimal function.      Eval:Patient is a 87 y.o. male who was  seen today for physical therapy evaluation and treatment for dizziness. Patient presents to physical therapy with complaint of light headed feelings with position changes, turning his head and difficulty with his balance. Patient demonstrates decreased strength, balance deficits and gait abnormalities which are negatively impacting patient ability to perform ADLs and functional mobility tasks.  Patient demonstrates increased vestibular symptoms with VOR testing which is negatively impacting patient ability to perform ADLs and functional mobility tasks. Patient will benefit from skilled physical therapy services to address these deficits to improve level of function with ADLs, functional mobility tasks, and reduce risk for  falls.     OBJECTIVE IMPAIRMENTS: Abnormal gait, decreased activity tolerance, decreased balance, decreased endurance, decreased mobility, difficulty walking, decreased ROM, decreased strength, dizziness, hypomobility, increased fascial restrictions, impaired perceived functional ability, impaired flexibility, and pain.   ACTIVITY LIMITATIONS: carrying, lifting, bending, standing, stairs, bathing, toileting, dressing, locomotion level, and caring for others  PARTICIPATION LIMITATIONS: meal prep, cleaning, laundry, driving, shopping, community activity, occupation, and yard work  PERSONAL FACTORS: 1 comorbidity: neuropathy  are also affecting patient's functional outcome.   REHAB POTENTIAL: Good  CLINICAL DECISION MAKING: Stable/uncomplicated  EVALUATION COMPLEXITY: Moderate   PLAN:  PT FREQUENCY: 2x/week  PT DURATION: 4 weeks  PLANNED INTERVENTIONS: Therapeutic exercises, Therapeutic activity, Neuromuscular re-education, Balance training, Gait training, Patient/Family education, Joint manipulation, Joint mobilization, Stair training, Orthotic/Fit training, DME instructions, Aquatic Therapy, Dry Needling, Electrical stimulation, Spinal manipulation, Spinal mobilization, Cryotherapy, Moist heat, Compression bandaging, scar mobilization, Splintting, Taping, Traction, Ultrasound, Ionotophoresis '4mg'$ /ml Dexamethasone, and Manual therapy   PLAN FOR NEXT SESSION: continue with focus on balance; gait training and lower extremity strengthening  8:17 AM, 04/27/22 Nayshawn Mesta Small Tejasvi Brissett MPT [facility] [place] [phone] [id number]

## 2022-04-29 ENCOUNTER — Ambulatory Visit (HOSPITAL_COMMUNITY): Payer: Medicare Other

## 2022-04-29 DIAGNOSIS — R42 Dizziness and giddiness: Secondary | ICD-10-CM

## 2022-04-29 DIAGNOSIS — R262 Difficulty in walking, not elsewhere classified: Secondary | ICD-10-CM

## 2022-04-29 NOTE — Therapy (Signed)
OUTPATIENT PHYSICAL THERAPY VESTIBULAR TREATMENT       Patient Name: Benjamin Yang MRN: [id number] DOB:1935-09-04, 87 y.o., male Today's Date: 04/29/2022  END OF SESSION:  PT End of Session - 04/29/22 0732     Visit Number 9    Number of Visits 16    Date for PT Re-Evaluation 05/25/22    Authorization Type Medicare    Progress Note Due on Visit 10    PT Start Time 0730    PT Stop Time 0810    PT Time Calculation (min) 40 min    Equipment Utilized During Treatment Gait belt    Activity Tolerance Patient limited by fatigue            Past Medical History:  Diagnosis Date   Cancer ([place])    bladder   Hypertension    Prostatitis    Past Surgical History:  Procedure Laterality Date   BACK SURGERY     CHOLECYSTECTOMY N/A 01/06/2021   Procedure: LAPAROSCOPIC CHOLECYSTECTOMY;  Surgeon: Aviva Signs, MD;  Location: [facility];  Service: General;  Laterality: N/A;   CYSTOSCOPY N/A 09/15/2012   Procedure: CYSTOSCOPY;  Surgeon: Alexis Frock, MD;  Location: [facility];  Service: Urology;  Laterality: N/A;   LYMPHADENECTOMY Bilateral 09/15/2012   Procedure:  Bilateral Pelvic Lymph Node Dissection;  Surgeon: Alexis Frock, MD;  Location: [facility];  Service: Urology;  Laterality: Bilateral;   ROBOT ASSISTED LAPAROSCOPIC COMPLETE CYSTECT ILEAL CONDUIT N/A 09/15/2012   Procedure: Robotic Cystoprostatectomy, Open Ileal Conduit Diversion,  Indocyanine Green Dye Injection into Bladder ;  Surgeon: Alexis Frock, MD;  Location: [facility];  Service: Urology;  Laterality: N/A;  Robotic Cystoprostatectomy, Bilateral Pelvic Lymph Node Dissection, Open Ileal Conduit Diversion, Cysto, Indocyanine Green Dye Injection into Bladder     ROBOTIC ASSISTED LAPAROSCOPIC LYSIS OF ADHESION N/A 09/15/2012   Procedure: ROBOTIC ASSISTED LAPAROSCOPIC LYSIS OF ADHESION;  Surgeon: Alexis Frock, MD;  Location: [facility];  Service: Urology;  Laterality: N/A;   Patient Active Problem List   Diagnosis Date Noted   Anxiety  03/21/2022   Hyperglycemia 03/21/2022   Hydronephrosis with obstructing calculus 03/20/2022   Empyema of gallbladder    Acute cholecystitis 01/02/2021   Severe sepsis ([place])    Sepsis ([place]) 04/13/2018   Lobar pneumonia ([place]) 04/13/2018   UTI (urinary tract infection) 04/13/2018   Essential hypertension 04/13/2018    PCP: Dr. Manuella Ghazi REFERRING PROVIDER: Raylene Miyamoto, MD  REFERRING DIAG: dizziness  THERAPY DIAG:  Dizziness and giddiness  Difficulty in walking, not elsewhere classified  ONSET DATE: years; worsened lately  Rationale for Evaluation and Treatment: Rehabilitation  SUBJECTIVE:   SUBJECTIVE STATEMENT: Got up out of bed ok this morning; but balance feels about the same; difficulty navigating tight spots; cannot lean too far over or can't keep from tipping over; no light headed or fainting spells  PERTINENT HISTORY: gallbladder removed 2022; had home health  PAIN:  Are you having pain? No  PRECAUTIONS: Fall  WEIGHT BEARING RESTRICTIONS: No  FALLS: Has patient fallen in last 6 months? Yes. Number of falls 2  LIVING ENVIRONMENT: Lives with: lives with their spouse Lives in: House/apartment Stairs: Yes: External: 1 steps; none Has following equipment at home: Walker - 2 wheeled, shower chair, Grab bars, and walking stick  PLOF: Independent with household mobility with device  PATIENT GOALS: be less tentative; have better balance; be able to walk out in the yard; walk better  OBJECTIVE:   DIAGNOSTIC FINDINGS: none in EPIC; patient  reports Manuella Ghazi did CT/MRI of head all clear  COGNITION: Overall cognitive status: Within functional limits for tasks assessed   SENSATION: Neuropathy in both foot especially left foot  MUSCLE TONE:  wfl   POSTURE:  rounded shoulders and forward head  Cervical ROM:  all wfl  Active A/PROM (deg) eval  Flexion   Extension   Right lateral flexion   Left lateral flexion   Right rotation   Left rotation   (Blank rows =  not tested)  STRENGTH: not tested today  LOWER EXTREMITY MMT:   MMT Right eval Left eval  Hip flexion    Hip abduction    Hip adduction    Hip internal rotation    Hip external rotation    Knee flexion    Knee extension    Ankle dorsiflexion    Ankle plantarflexion    Ankle inversion    Ankle eversion    (Blank rows = not tested)  BED MOBILITY:  Sit to supine Modified independence Supine to sit Modified independence  TRANSFERS: Assistive device utilized:  walking stick   Sit to stand: SBA Stand to sit: SBA Chair to chair: SBA  GAIT: Gait pattern: decreased arm swing- Right, decreased arm swing- Left, decreased step length- Right, and decreased step length- Left Distance walked: 80 ft Assistive device utilized:  walking stick Level of assistance: SBA Comments: slow gait speed; short step length  FUNCTIONAL TESTS:  5 times sit to stand: 47.28 using arms to assist up  PATIENT SURVEYS:  DHI 60; severe handicap  VESTIBULAR ASSESSMENT:  GENERAL OBSERVATION: no glassess   SYMPTOM BEHAVIOR:  Subjective history: see above  Non-Vestibular symptoms: changes in hearing  Type of dizziness: Imbalance (Disequilibrium) and Lightheadedness/Faint  Frequency: varies  Duration: varies  Aggravating factors: Induced by position change: supine to sit, Induced by motion: bending down to the ground and turning head quickly, Worse outside or in busy environment, and Moving eyes  Relieving factors: no known relieving factors  Progression of symptoms: unchanged  OCULOMOTOR EXAM:  Ocular Alignment: normal  Ocular ROM: No Limitations  Spontaneous Nystagmus: absent  Gaze-Induced Nystagmus: absent  Smooth Pursuits: intact  Saccades:   Convergence/Divergence: not tested  VESTIBULAR - OCULAR REFLEX:   Slow VOR: Positive Right  VOR Cancellation:   Head-Impulse Test: HIT Right: positive HIT Left: negative  Dynamic Visual Acuity:  not tested   POSITIONAL TESTING: Right  Dix-Hallpike: no nystagmus Left Dix-Hallpike: no nystagmus  MOTION SENSITIVITY:  Motion Sensitivity Quotient Intensity: 0 = none, 1 = Lightheaded, 2 = Mild, 3 = Moderate, 4 = Severe, 5 = Vomiting  Intensity  1. Sitting to supine 0  2. Supine to L side   3. Supine to R side   4. Supine to sitting 1  5. L Hallpike-Dix 0  6. Up from L  1  7. R Hallpike-Dix 0  8. Up from R  1  9. Sitting, head tipped to L knee 0  10. Head up from L knee 0  11. Sitting, head tipped to R knee 0  12. Head up from R knee 0  13. Sitting head turns x5 0  14.Sitting head nods x5 0  15. In stance, 180 turn to L    16. In stance, 180 turn to R     OTHOSTATICS: not done  FUNCTIONAL GAIT:    VESTIBULAR TREATMENT:  DATE:  04/29/22 Standing: // bars (CGA for all exercise for safety) Heel raises x 20 with 1 hand UE assist 4" box step ups 2 x 5 each no UE assist 4" lateral steps x 5 each no UE assist Small BOS on foam with head turns and nods 2 x 30" each no UE assist SLS on blue foam; 3 x 3" on right; 3 x 10" on left no UE assist Cone taps x 2; 5 times each leg with 2 fingertip UE assist Hip vectors 2 x 5 each leg   04/27/22 Progress note DHI 58 (54 + severe handicap) 5 x sit to stand 30.09 sec using hands to push up SLS 4" each Standing: 4" step ups x 5 each foot no UE assist Hip vectors with 1 UE assist x 5 each     04/23/22 Standing: // heel raises x 10; 1 UE assist SLS 3 x 10" each with 1 UE assist Tandem stance no UE assist x 30" each; CGA for safety Small BOS on black foam with head turns no UE assist 2 x 1' Sit to stand 2 x 5 hands on thighs to assist to pushing up 4" step ups x 5 each no UE assist  Seated: Hamstring stretch 10 x 10"     04/21/22 Standing: Head turns and nods x 10 each with eyes on target Small BOS with head turns and nods with eyes on target x 10 Tandem stance  with head turns and nods with eyes on target x 10 each way 2 targets standing and head turns x 10 Rhythmic stabilization 2 x 20" Rhythmic stabilization in tandem stance x 20" each way Heel raises x 10 with both UE assist Hip abduction x 10 each with both UE assist Weighted ball trunk rotation with eyes following x 10 each   Seated: Hamstring/calf stretch with strap 5 x 20"    04/16/22 Seated:  Horizontal saccades by reading numbers x 1 minute   X1 horizontal viewing by identifying colors x 1 minute X1 vertical viewing by identifying shapes x 1 minute X1 horizontal viewing by identifying animal silhouettes x 1 minute X1 vertical viewing by counting shapes/colors x 1 minute Calf stretch with a strap on B x 30" x 3 Standing, firm surface, eyes, open, normal BOS:  Rhythmic stabilization ant/post x 3" x 10 x 2 on each side alternating  Reaching in various direction x 1'  Heel raises x 10 x 2  Marches x 10 x 2  Mini squats x 3" x 10 x 2  04/09/22 Seated:  Horizontal saccades by reading numbers x 1 minute x 2 reps  Horizontal saccades by identifying shapes x 1 minute  X1 horizontal viewing by identifying colors x 1 minute X1 vertical viewing by identifying shapes x 1 minute X1 horizontal viewing by identifying animal silhouettes x 1 minute X1 vertical viewing by counting shapes/colors x 1 minute Calf stretch with a strap on B x 30" x 3 Standing, firm surface, eyes, open, normal BOS:  Rhythmic stabilization ant/post x 3" x 10 x 2 on each side alternating  Reaching in various direction x 1'  04/07/22 Seated:  Horizontal saccades by reading numbers x 1 minute x 3 reps  X1 horizontal viewing by identifying colors x 1 minute X1 vertical viewing by identifying shapes x 1 minute Standing, firm surface, eyes, open, normal BOS:  Rhythmic stabilization ant/post x 3" x 10 x 2 on each side alternating  Reaching in various direction  03/30/22 physical therapy evaluation and HEP  instruction  Canalith Repositioning:   Gaze Adaptation:  x1 Viewing Horizontal: Position: sitting and Reps: 10 and x1 Viewing Vertical:  Position: sitting and Reps: 10 Habituation:  Seated Vertical Head Movements: number of reps: 10 and Seated Horizontal Head Movements: number of reps: 10 Other: issued for HEP  PATIENT EDUCATION: Education details: 04/16/22: updated HEP  Person educated: Patient Education method: Explanation, Demonstration, and Handouts Education comprehension: verbalized understanding, returned demonstration, verbal cues required, and tactile cues required   HOME EXERCISE PROGRAM:  Access Code: [email or web address] URL: [email or web address] Date: 03/30/2022 Prepared by: [facility]  04/16/22 - Mini Squat with Counter Support  - 1-2 x daily - 7 x weekly - 2 sets - 10 reps - 3 hold - Heel Raises with Counter Support  - 1-2 x daily - 7 x weekly - 2 sets - 10 reps 04/09/22 - Seated Calf Stretch with Strap  - 1-2 x daily - 7 x weekly - 3 reps - 30 hold Exercises - Seated Gaze Stabilization with Head Nod  - 3 x daily - 7 x weekly - 1 sets - 10-20 reps - Seated Gaze Stabilization with Head Rotation  - 3 x daily - 7 x weekly - 1 sets - 10-20 reps  GOALS: Goals reviewed with patient? No  SHORT TERM GOALS: Target date: 04/13/2022  patient will be independent with initial HEP  Baseline: Goal status: MET  2.  Patient will self report 30% improvement to improve tolerance for functional activity   Baseline:  Goal status: MET   LONG TERM GOALS: Target date: 04/27/2022  Patient will be independent in self management strategies to improve quality of life and functional outcomes.  Baseline:  Goal status: IN PROGRESS  2.  Patient will self report 50% improvement to improve tolerance for functional activity   Baseline:  Goal status: IN PROGRESS  3.  Patient will improve score on DHI by 20 points to demostrate decreased dizziness with functional tasks (goal  40) Baseline: 60; 58 04/27/22  Goal status: IN PROGRESS  4.  Patient will remain free of falls Baseline: 2/27 no new falls Goal status: IN PROGRESS  5.  Patient will improve 5 times sit to stand score from 47.28  sec to 30 sec to demonstrate improved functional mobility and increased lower extremity strength.  Baseline: 04/27/22 30.09 sec using hands to assist up to standing Goal status: IN PROGRESS  6. (Goal added 04/27/22) patient will be able to stand SLS x 10" each leg to demonstrate improved functional balance and decreased fall risk.  Baseline: 4 sec each leg  Goal status: in progress   ASSESSMENT:  CLINICAL IMPRESSION   Today's session continued to focus on balance; lower extremity strength; vestibular training in combination with balance training.  Added lateral step ups today with good challenge.  Patient fatigues quickly and needs several short rest breaks. Needs CGA for safety will all standing exercises.  Tends to lean backwards with standing without walking stick.  Patient states he does notice light headed type symptoms when he is stressed or anxious; has that sensation with SLS exercise today.   Needs cues to avoid trunk substitution with hip vectors especially hip extension. Patient will benefit from continued skilled therapy services to address deficits and promote return to optimal function.      Eval:Patient is a 87 y.o. male who was seen today for physical therapy evaluation and treatment for dizziness. Patient presents to physical therapy with complaint  of light headed feelings with position changes, turning his head and difficulty with his balance. Patient demonstrates decreased strength, balance deficits and gait abnormalities which are negatively impacting patient ability to perform ADLs and functional mobility tasks.  Patient demonstrates increased vestibular symptoms with VOR testing which is negatively impacting patient ability to perform ADLs and functional mobility  tasks. Patient will benefit from skilled physical therapy services to address these deficits to improve level of function with ADLs, functional mobility tasks, and reduce risk for falls.     OBJECTIVE IMPAIRMENTS: Abnormal gait, decreased activity tolerance, decreased balance, decreased endurance, decreased mobility, difficulty walking, decreased ROM, decreased strength, dizziness, hypomobility, increased fascial restrictions, impaired perceived functional ability, impaired flexibility, and pain.   ACTIVITY LIMITATIONS: carrying, lifting, bending, standing, stairs, bathing, toileting, dressing, locomotion level, and caring for others  PARTICIPATION LIMITATIONS: meal prep, cleaning, laundry, driving, shopping, community activity, occupation, and yard work  PERSONAL FACTORS: 1 comorbidity: neuropathy  are also affecting patient's functional outcome.   REHAB POTENTIAL: Good  CLINICAL DECISION MAKING: Stable/uncomplicated  EVALUATION COMPLEXITY: Moderate   PLAN:  PT FREQUENCY: 2x/week  PT DURATION: 4 weeks  PLANNED INTERVENTIONS: Therapeutic exercises, Therapeutic activity, Neuromuscular re-education, Balance training, Gait training, Patient/Family education, Joint manipulation, Joint mobilization, Stair training, Orthotic/Fit training, DME instructions, Aquatic Therapy, Dry Needling, Electrical stimulation, Spinal manipulation, Spinal mobilization, Cryotherapy, Moist heat, Compression bandaging, scar mobilization, Splintting, Taping, Traction, Ultrasound, Ionotophoresis '4mg'$ /ml Dexamethasone, and Manual therapy   PLAN FOR NEXT SESSION: continue with focus on balance; gait training and lower extremity strengthening  8:12 AM, 04/29/22 Benjamin Yang MPT [facility] [place] [facility] [place] [phone] [id number]

## 2022-05-05 ENCOUNTER — Ambulatory Visit (HOSPITAL_COMMUNITY): Payer: Medicare Other | Attending: Otolaryngology

## 2022-05-05 DIAGNOSIS — R262 Difficulty in walking, not elsewhere classified: Secondary | ICD-10-CM | POA: Insufficient documentation

## 2022-05-05 DIAGNOSIS — R42 Dizziness and giddiness: Secondary | ICD-10-CM | POA: Insufficient documentation

## 2022-05-05 DIAGNOSIS — R2689 Other abnormalities of gait and mobility: Secondary | ICD-10-CM | POA: Insufficient documentation

## 2022-05-05 NOTE — Therapy (Signed)
OUTPATIENT PHYSICAL THERAPY VESTIBULAR TREATMENT       Patient Name: Benjamin Yang MRN: [id number] DOB:15-Jul-1935, 87 y.o., male Today's Date: 05/05/2022  END OF SESSION:  PT End of Session - 05/05/22 0730     Visit Number 10    Number of Visits 16    Date for PT Re-Evaluation 05/25/22    Authorization Type Medicare    PT Start Time 0730    PT Stop Time 0810    PT Time Calculation (min) 40 min    Equipment Utilized During Treatment Gait belt    Activity Tolerance Patient tolerated treatment well    Behavior During Therapy WFL for tasks assessed/performed            Past Medical History:  Diagnosis Date   Cancer ([place])    bladder   Hypertension    Prostatitis    Past Surgical History:  Procedure Laterality Date   BACK SURGERY     CHOLECYSTECTOMY N/A 01/06/2021   Procedure: LAPAROSCOPIC CHOLECYSTECTOMY;  Surgeon: Aviva Signs, MD;  Location: [facility];  Service: General;  Laterality: N/A;   CYSTOSCOPY N/A 09/15/2012   Procedure: CYSTOSCOPY;  Surgeon: Alexis Frock, MD;  Location: [facility];  Service: Urology;  Laterality: N/A;   LYMPHADENECTOMY Bilateral 09/15/2012   Procedure:  Bilateral Pelvic Lymph Node Dissection;  Surgeon: Alexis Frock, MD;  Location: [facility];  Service: Urology;  Laterality: Bilateral;   ROBOT ASSISTED LAPAROSCOPIC COMPLETE CYSTECT ILEAL CONDUIT N/A 09/15/2012   Procedure: Robotic Cystoprostatectomy, Open Ileal Conduit Diversion,  Indocyanine Green Dye Injection into Bladder ;  Surgeon: Alexis Frock, MD;  Location: [facility];  Service: Urology;  Laterality: N/A;  Robotic Cystoprostatectomy, Bilateral Pelvic Lymph Node Dissection, Open Ileal Conduit Diversion, Cysto, Indocyanine Green Dye Injection into Bladder     ROBOTIC ASSISTED LAPAROSCOPIC LYSIS OF ADHESION N/A 09/15/2012   Procedure: ROBOTIC ASSISTED LAPAROSCOPIC LYSIS OF ADHESION;  Surgeon: Alexis Frock, MD;  Location: [facility];  Service: Urology;  Laterality: N/A;   Patient Active Problem List    Diagnosis Date Noted   Anxiety 03/21/2022   Hyperglycemia 03/21/2022   Hydronephrosis with obstructing calculus 03/20/2022   Empyema of gallbladder    Acute cholecystitis 01/02/2021   Severe sepsis ([place])    Sepsis ([place]) 04/13/2018   Lobar pneumonia ([place]) 04/13/2018   UTI (urinary tract infection) 04/13/2018   Essential hypertension 04/13/2018    PCP: Dr. Manuella Ghazi REFERRING PROVIDER: Raylene Miyamoto, MD  REFERRING DIAG: dizziness  THERAPY DIAG:  Dizziness and giddiness  ONSET DATE: years; worsened lately  Rationale for Evaluation and Treatment: Rehabilitation  SUBJECTIVE:   SUBJECTIVE STATEMENT: Patient still c/o issues with his balance. Dizziness is getting better with some minor lapses once in a while especially when he stands up fast. Denies recent falls  PERTINENT HISTORY: gallbladder removed 2022; had home health  PAIN:  Are you having pain? No  PRECAUTIONS: Fall  WEIGHT BEARING RESTRICTIONS: No  FALLS: Has patient fallen in last 6 months? Yes. Number of falls 2  LIVING ENVIRONMENT: Lives with: lives with their spouse Lives in: House/apartment Stairs: Yes: External: 1 steps; none Has following equipment at home: Walker - 2 wheeled, shower chair, Grab bars, and walking stick  PLOF: Independent with household mobility with device  PATIENT GOALS: be less tentative; have better balance; be able to walk out in the yard; walk better  OBJECTIVE:   DIAGNOSTIC FINDINGS: none in EPIC; patient reports Manuella Ghazi did CT/MRI of head all clear  COGNITION: Overall cognitive status:  Within functional limits for tasks assessed   SENSATION: Neuropathy in both foot especially left foot  MUSCLE TONE:  wfl   POSTURE:  rounded shoulders and forward head  Cervical ROM:  all wfl  Active A/PROM (deg) eval  Flexion   Extension   Right lateral flexion   Left lateral flexion   Right rotation   Left rotation   (Blank rows = not tested)  STRENGTH: not tested  today  LOWER EXTREMITY MMT:   MMT Right eval Left eval  Hip flexion    Hip abduction    Hip adduction    Hip internal rotation    Hip external rotation    Knee flexion    Knee extension    Ankle dorsiflexion    Ankle plantarflexion    Ankle inversion    Ankle eversion    (Blank rows = not tested)  BED MOBILITY:  Sit to supine Modified independence Supine to sit Modified independence  TRANSFERS: Assistive device utilized:  walking stick   Sit to stand: SBA Stand to sit: SBA Chair to chair: SBA  GAIT: Gait pattern: decreased arm swing- Right, decreased arm swing- Left, decreased step length- Right, and decreased step length- Left Distance walked: 80 ft Assistive device utilized:  walking stick Level of assistance: SBA Comments: slow gait speed; short step length  FUNCTIONAL TESTS:  5 times sit to stand: 47.28 using arms to assist up  PATIENT SURVEYS:  DHI 60; severe handicap  VESTIBULAR ASSESSMENT:  GENERAL OBSERVATION: no glassess   SYMPTOM BEHAVIOR:  Subjective history: see above  Non-Vestibular symptoms: changes in hearing  Type of dizziness: Imbalance (Disequilibrium) and Lightheadedness/Faint  Frequency: varies  Duration: varies  Aggravating factors: Induced by position change: supine to sit, Induced by motion: bending down to the ground and turning head quickly, Worse outside or in busy environment, and Moving eyes  Relieving factors: no known relieving factors  Progression of symptoms: unchanged  OCULOMOTOR EXAM:  Ocular Alignment: normal  Ocular ROM: No Limitations  Spontaneous Nystagmus: absent  Gaze-Induced Nystagmus: absent  Smooth Pursuits: intact  Saccades:   Convergence/Divergence: not tested  VESTIBULAR - OCULAR REFLEX:   Slow VOR: Positive Right  VOR Cancellation:   Head-Impulse Test: HIT Right: positive HIT Left: negative  Dynamic Visual Acuity:  not tested   POSITIONAL TESTING: Right Dix-Hallpike: no nystagmus Left  Dix-Hallpike: no nystagmus  MOTION SENSITIVITY:  Motion Sensitivity Quotient Intensity: 0 = none, 1 = Lightheaded, 2 = Mild, 3 = Moderate, 4 = Severe, 5 = Vomiting  Intensity  1. Sitting to supine 0  2. Supine to L side   3. Supine to R side   4. Supine to sitting 1  5. L Hallpike-Dix 0  6. Up from L  1  7. R Hallpike-Dix 0  8. Up from R  1  9. Sitting, head tipped to L knee 0  10. Head up from L knee 0  11. Sitting, head tipped to R knee 0  12. Head up from R knee 0  13. Sitting head turns x5 0  14.Sitting head nods x5 0  15. In stance, 180 turn to L    16. In stance, 180 turn to R     OTHOSTATICS: not done  FUNCTIONAL GAIT:    VESTIBULAR TREATMENT:  DATE:  05/05/22 Seated: Toe raises x 20 Standing: // bars (CGA for all exercise for safety) Heel raises x 20 with 1 hand UE assist Small BOS on foam with head turns and nods 2 x 30" each no UE assist Rhythmic stabilization, firm surface, eyes open, ant/post x 3" x 10 x 2 on each side alternating Standing on blue foam, eyes closed, normal BOS x 30" x 2 Cone taps x 2; 10 times each leg with 2 fingertip UE assist Heel taps on a floor target x 10 x 2 on each, 1 UE assist Hip vectors 2 x 5 each leg  04/29/22 Standing: // bars (CGA for all exercise for safety) Heel raises x 20 with 1 hand UE assist 4" box step ups 2 x 5 each no UE assist 4" lateral steps x 5 each no UE assist Small BOS on foam with head turns and nods 2 x 30" each no UE assist SLS on blue foam; 3 x 3" on right; 3 x 10" on left no UE assist Cone taps x 2; 5 times each leg with 2 fingertip UE assist Hip vectors 2 x 5 each leg   04/27/22 Progress note DHI 58 (54 + severe handicap) 5 x sit to stand 30.09 sec using hands to push up SLS 4" each Standing: 4" step ups x 5 each foot no UE assist Hip vectors with 1 UE assist x 5 each     04/23/22 Standing: //  heel raises x 10; 1 UE assist SLS 3 x 10" each with 1 UE assist Tandem stance no UE assist x 30" each; CGA for safety Small BOS on black foam with head turns no UE assist 2 x 1' Sit to stand 2 x 5 hands on thighs to assist to pushing up 4" step ups x 5 each no UE assist  Seated: Hamstring stretch 10 x 10"     04/21/22 Standing: Head turns and nods x 10 each with eyes on target Small BOS with head turns and nods with eyes on target x 10 Tandem stance with head turns and nods with eyes on target x 10 each way 2 targets standing and head turns x 10 Rhythmic stabilization 2 x 20" Rhythmic stabilization in tandem stance x 20" each way Heel raises x 10 with both UE assist Hip abduction x 10 each with both UE assist Weighted ball trunk rotation with eyes following x 10 each   Seated: Hamstring/calf stretch with strap 5 x 20"    04/16/22 Seated:  Horizontal saccades by reading numbers x 1 minute   X1 horizontal viewing by identifying colors x 1 minute X1 vertical viewing by identifying shapes x 1 minute X1 horizontal viewing by identifying animal silhouettes x 1 minute X1 vertical viewing by counting shapes/colors x 1 minute Calf stretch with a strap on B x 30" x 3 Standing, firm surface, eyes, open, normal BOS:  Rhythmic stabilization ant/post x 3" x 10 x 2 on each side alternating  Reaching in various direction x 1'  Heel raises x 10 x 2  Marches x 10 x 2  Mini squats x 3" x 10 x 2  04/09/22 Seated:  Horizontal saccades by reading numbers x 1 minute x 2 reps  Horizontal saccades by identifying shapes x 1 minute  X1 horizontal viewing by identifying colors x 1 minute X1 vertical viewing by identifying shapes x 1 minute X1 horizontal viewing by identifying animal silhouettes x 1 minute X1 vertical viewing by counting  shapes/colors x 1 minute Calf stretch with a strap on B x 30" x 3 Standing, firm surface, eyes, open, normal BOS:  Rhythmic stabilization ant/post x 3" x 10 x  2 on each side alternating  Reaching in various direction x 1'  04/07/22 Seated:  Horizontal saccades by reading numbers x 1 minute x 3 reps  X1 horizontal viewing by identifying colors x 1 minute X1 vertical viewing by identifying shapes x 1 minute Standing, firm surface, eyes, open, normal BOS:  Rhythmic stabilization ant/post x 3" x 10 x 2 on each side alternating  Reaching in various direction   03/30/22 physical therapy evaluation and HEP instruction  [place] Repositioning:   Gaze Adaptation:  x1 Viewing Horizontal: Position: sitting and Reps: 10 and x1 Viewing Vertical:  Position: sitting and Reps: 10 Habituation:  Seated Vertical Head Movements: number of reps: 10 and Seated Horizontal Head Movements: number of reps: 10 Other: issued for HEP  PATIENT EDUCATION: Education details: 04/16/22: updated HEP  Person educated: Patient Education method: Explanation, Demonstration, and Handouts Education comprehension: verbalized understanding, returned demonstration, verbal cues required, and tactile cues required   HOME EXERCISE PROGRAM:  Access Code: [email or web address] URL: [email or web address] Date: 03/30/2022 Prepared by: [facility]  05/05/22  04/16/22 - Mini Squat with Counter Support  - 1-2 x daily - 7 x weekly - 2 sets - 10 reps - 3 hold - Heel Raises with Counter Support  - 1-2 x daily - 7 x weekly - 2 sets - 10 reps 04/09/22 - Seated Calf Stretch with Strap  - 1-2 x daily - 7 x weekly - 3 reps - 30 hold Exercises - Seated Gaze Stabilization with Head Nod  - 3 x daily - 7 x weekly - 1 sets - 10-20 reps - Seated Gaze Stabilization with Head Rotation  - 3 x daily - 7 x weekly - 1 sets - 10-20 reps  GOALS: Goals reviewed with patient? No  SHORT TERM GOALS: Target date: 04/13/2022  patient will be independent with initial HEP  Baseline: Goal status: MET  2.  Patient will self report 30% improvement to improve tolerance for functional activity   Baseline:   Goal status: MET   LONG TERM GOALS: Target date: 04/27/2022  Patient will be independent in self management strategies to improve quality of life and functional outcomes.  Baseline:  Goal status: IN PROGRESS  2.  Patient will self report 50% improvement to improve tolerance for functional activity   Baseline:  Goal status: IN PROGRESS  3.  Patient will improve score on DHI by 20 points to demostrate decreased dizziness with functional tasks (goal 40) Baseline: 60; 58 04/27/22  Goal status: IN PROGRESS  4.  Patient will remain free of falls Baseline: 2/27 no new falls Goal status: IN PROGRESS  5.  Patient will improve 5 times sit to stand score from 47.28  sec to 30 sec to demonstrate improved functional mobility and increased lower extremity strength.  Baseline: 04/27/22 30.09 sec using hands to assist up to standing Goal status: IN PROGRESS  6. (Goal added 04/27/22) patient will be able to stand SLS x 10" each leg to demonstrate improved functional balance and decreased fall risk.  Baseline: 4 sec each leg  Goal status: in progress   ASSESSMENT:  CLINICAL IMPRESSION Interventions today were geared towards LE strengthening, flexibility, and balance. Tolerated all activities mild to moderate difficulty due to weakness and impaired proprioception. Severe difficulty noted on toe raises and rhythmic stabilization (ant  direction) due to weakness of the dorsiflexors. Demonstrated appropriate levels of fatigue. Provided moderate amount of multimodal cueing to ensure correct execution of activity with fair carry-over. To date, skilled PT is required to address the impairments and improve function. .    Eval:Patient is a 87 y.o. male who was seen today for physical therapy evaluation and treatment for dizziness. Patient presents to physical therapy with complaint of light headed feelings with position changes, turning his head and difficulty with his balance. Patient demonstrates  decreased strength, balance deficits and gait abnormalities which are negatively impacting patient ability to perform ADLs and functional mobility tasks.  Patient demonstrates increased vestibular symptoms with VOR testing which is negatively impacting patient ability to perform ADLs and functional mobility tasks. Patient will benefit from skilled physical therapy services to address these deficits to improve level of function with ADLs, functional mobility tasks, and reduce risk for falls.     OBJECTIVE IMPAIRMENTS: Abnormal gait, decreased activity tolerance, decreased balance, decreased endurance, decreased mobility, difficulty walking, decreased ROM, decreased strength, dizziness, hypomobility, increased fascial restrictions, impaired perceived functional ability, impaired flexibility, and pain.   ACTIVITY LIMITATIONS: carrying, lifting, bending, standing, stairs, bathing, toileting, dressing, locomotion level, and caring for others  PARTICIPATION LIMITATIONS: meal prep, cleaning, laundry, driving, shopping, community activity, occupation, and yard work  PERSONAL FACTORS: 1 comorbidity: neuropathy  are also affecting patient's functional outcome.   REHAB POTENTIAL: Good  CLINICAL DECISION MAKING: Stable/uncomplicated  EVALUATION COMPLEXITY: Moderate   PLAN:  PT FREQUENCY: 2x/week  PT DURATION: 4 weeks  PLANNED INTERVENTIONS: Therapeutic exercises, Therapeutic activity, Neuromuscular re-education, Balance training, Gait training, Patient/Family education, Joint manipulation, Joint mobilization, Stair training, Orthotic/Fit training, DME instructions, Aquatic Therapy, Dry Needling, Electrical stimulation, Spinal manipulation, Spinal mobilization, Cryotherapy, Moist heat, Compression bandaging, scar mobilization, Splintting, Taping, Traction, Ultrasound, Ionotophoresis '4mg'$ /ml Dexamethasone, and Manual therapy   PLAN FOR NEXT SESSION: continue with focus on balance; gait training and  lower extremity strengthening. May benefit from FES/NMES to strengthen the dorsiflexors.  8:16 AM, 05/05/22 Harvie Heck. Keyah Blizard, PT, DPT, OCS Board-Certified Clinical Specialist in [place] # ([place]): [phone] T

## 2022-05-06 ENCOUNTER — Ambulatory Visit (HOSPITAL_COMMUNITY): Payer: Medicare Other

## 2022-05-06 DIAGNOSIS — R42 Dizziness and giddiness: Secondary | ICD-10-CM

## 2022-05-06 DIAGNOSIS — R262 Difficulty in walking, not elsewhere classified: Secondary | ICD-10-CM | POA: Diagnosis not present

## 2022-05-06 DIAGNOSIS — R2689 Other abnormalities of gait and mobility: Secondary | ICD-10-CM | POA: Diagnosis not present

## 2022-05-06 NOTE — Therapy (Signed)
OUTPATIENT PHYSICAL THERAPY VESTIBULAR TREATMENT       Patient Name: Benjamin Yang MRN: [id number] DOB:[birth year], 87 y.o., male Today's Date: 05/06/2022  END OF SESSION:  PT End of Session - 05/06/22 1352     Visit Number 11    Number of Visits 16    Date for PT Re-Evaluation 05/25/22    Authorization Type Medicare    PT Start Time 1345    PT Stop Time 1425    PT Time Calculation (min) 40 min    Equipment Utilized During Treatment Gait belt    Activity Tolerance Patient tolerated treatment well    Behavior During Therapy WFL for tasks assessed/performed            Past Medical History:  Diagnosis Date   Cancer ([place])    bladder   Hypertension    Prostatitis    Past Surgical History:  Procedure Laterality Date   BACK SURGERY     CHOLECYSTECTOMY N/A 01/06/2021   Procedure: LAPAROSCOPIC CHOLECYSTECTOMY;  Surgeon: Aviva Signs, MD;  Location: [facility];  Service: General;  Laterality: N/A;   CYSTOSCOPY N/A 09/15/2012   Procedure: CYSTOSCOPY;  Surgeon: Alexis Frock, MD;  Location: [facility];  Service: Urology;  Laterality: N/A;   LYMPHADENECTOMY Bilateral 09/15/2012   Procedure:  Bilateral Pelvic Lymph Node Dissection;  Surgeon: Alexis Frock, MD;  Location: [facility];  Service: Urology;  Laterality: Bilateral;   ROBOT ASSISTED LAPAROSCOPIC COMPLETE CYSTECT ILEAL CONDUIT N/A 09/15/2012   Procedure: Robotic Cystoprostatectomy, Open Ileal Conduit Diversion,  Indocyanine Green Dye Injection into Bladder ;  Surgeon: Alexis Frock, MD;  Location: [facility];  Service: Urology;  Laterality: N/A;  Robotic Cystoprostatectomy, Bilateral Pelvic Lymph Node Dissection, Open Ileal Conduit Diversion, Cysto, Indocyanine Green Dye Injection into Bladder     ROBOTIC ASSISTED LAPAROSCOPIC LYSIS OF ADHESION N/A 09/15/2012   Procedure: ROBOTIC ASSISTED LAPAROSCOPIC LYSIS OF ADHESION;  Surgeon: Alexis Frock, MD;  Location: [facility];  Service: Urology;  Laterality: N/A;   Patient Active Problem List    Diagnosis Date Noted   Anxiety 03/21/2022   Hyperglycemia 03/21/2022   Hydronephrosis with obstructing calculus 03/20/2022   Empyema of gallbladder    Acute cholecystitis 01/02/2021   Severe sepsis ([place])    Sepsis ([place]) 04/13/2018   Lobar pneumonia ([place]) 04/13/2018   UTI (urinary tract infection) 04/13/2018   Essential hypertension 04/13/2018    PCP: Dr. Manuella Ghazi REFERRING PROVIDER: Raylene Miyamoto, MD  REFERRING DIAG: dizziness  THERAPY DIAG:  Dizziness and giddiness  ONSET DATE: years; worsened lately  Rationale for Evaluation and Treatment: Rehabilitation  SUBJECTIVE:   SUBJECTIVE STATEMENT: Denies recent falls. Denies changes from the previous session but states that the toe taps/raises are done without his shoes.  PERTINENT HISTORY: gallbladder removed 2022; had home health  PAIN:  Are you having pain? No  PRECAUTIONS: Fall  WEIGHT BEARING RESTRICTIONS: No  FALLS: Has patient fallen in last 6 months? Yes. Number of falls 2  LIVING ENVIRONMENT: Lives with: lives with their spouse Lives in: House/apartment Stairs: Yes: External: 1 steps; none Has following equipment at home: Walker - 2 wheeled, shower chair, Grab bars, and walking stick  PLOF: Independent with household mobility with device  PATIENT GOALS: be less tentative; have better balance; be able to walk out in the yard; walk better  OBJECTIVE:   DIAGNOSTIC FINDINGS: none in EPIC; patient reports Manuella Ghazi did CT/MRI of head all clear  COGNITION: Overall cognitive status: Within functional limits for tasks assessed  SENSATION: Neuropathy in both foot especially left foot  MUSCLE TONE:  wfl   POSTURE:  rounded shoulders and forward head  Cervical ROM:  all wfl  Active A/PROM (deg) eval  Flexion   Extension   Right lateral flexion   Left lateral flexion   Right rotation   Left rotation   (Blank rows = not tested)  STRENGTH: not tested today  LOWER EXTREMITY MMT:   MMT Right eval  Left eval  Hip flexion    Hip abduction    Hip adduction    Hip internal rotation    Hip external rotation    Knee flexion    Knee extension    Ankle dorsiflexion    Ankle plantarflexion    Ankle inversion    Ankle eversion    (Blank rows = not tested)  BED MOBILITY:  Sit to supine Modified independence Supine to sit Modified independence  TRANSFERS: Assistive device utilized:  walking stick   Sit to stand: SBA Stand to sit: SBA Chair to chair: SBA  GAIT: Gait pattern: decreased arm swing- Right, decreased arm swing- Left, decreased step length- Right, and decreased step length- Left Distance walked: 80 ft Assistive device utilized:  walking stick Level of assistance: SBA Comments: slow gait speed; short step length  FUNCTIONAL TESTS:  5 times sit to stand: 47.28 using arms to assist up  PATIENT SURVEYS:  DHI 60; severe handicap  VESTIBULAR ASSESSMENT:  GENERAL OBSERVATION: no glassess   SYMPTOM BEHAVIOR:  Subjective history: see above  Non-Vestibular symptoms: changes in hearing  Type of dizziness: Imbalance (Disequilibrium) and Lightheadedness/Faint  Frequency: varies  Duration: varies  Aggravating factors: Induced by position change: supine to sit, Induced by motion: bending down to the ground and turning head quickly, Worse outside or in busy environment, and Moving eyes  Relieving factors: no known relieving factors  Progression of symptoms: unchanged  OCULOMOTOR EXAM:  Ocular Alignment: normal  Ocular ROM: No Limitations  Spontaneous Nystagmus: absent  Gaze-Induced Nystagmus: absent  Smooth Pursuits: intact  Saccades:   Convergence/Divergence: not tested  VESTIBULAR - OCULAR REFLEX:   Slow VOR: Positive Right  VOR Cancellation:   Head-Impulse Test: HIT Right: positive HIT Left: negative  Dynamic Visual Acuity:  not tested   POSITIONAL TESTING: Right Dix-Hallpike: no nystagmus Left Dix-Hallpike: no nystagmus  MOTION SENSITIVITY:  Motion  Sensitivity Quotient Intensity: 0 = none, 1 = Lightheaded, 2 = Mild, 3 = Moderate, 4 = Severe, 5 = Vomiting  Intensity  1. Sitting to supine 0  2. Supine to L side   3. Supine to R side   4. Supine to sitting 1  5. L Hallpike-Dix 0  6. Up from L  1  7. R Hallpike-Dix 0  8. Up from R  1  9. Sitting, head tipped to L knee 0  10. Head up from L knee 0  11. Sitting, head tipped to R knee 0  12. Head up from R knee 0  13. Sitting head turns x5 0  14.Sitting head nods x5 0  15. In stance, 180 turn to L    16. In stance, 180 turn to R     OTHOSTATICS: not done  FUNCTIONAL GAIT:    VESTIBULAR TREATMENT:  DATE:  05/06/22 Seated: Toe raises x 20 Standing: // bars (CGA for all exercise for safety) Gastrocnemius slant board stretch x 30" x 3 Ankle PF, GTB x 10 x  2 each Heel raises x 20 with 1 hand UE assist Small BOS on foam with head turns and nods 2 x 30" each no UE assist Rhythmic stabilization, firm surface, eyes open, ant/post x 3" x 10 x 2 on each side alternating Standing on blue foam, eyes closed, normal BOS x 30" x 2 Cone taps x 2; 10 times each leg with 1 fingertip UE assist Heel taps on a floor target x 10 x 2 on each, with 2 fingertip UE assist Hip vectors x 10 each leg  05/05/22 Seated: Toe raises x 20 Standing: // bars (CGA for all exercise for safety) Heel raises x 20 with 1 hand UE assist Small BOS on foam with head turns and nods 2 x 30" each no UE assist Rhythmic stabilization, firm surface, eyes open, ant/post x 3" x 10 x 2 on each side alternating Standing on blue foam, eyes closed, normal BOS x 30" x 2 Cone taps x 2; 10 times each leg with 2 fingertip UE assist Heel taps on a floor target x 10 x 2 on each, 1 UE assist Hip vectors 2 x 5 each leg  04/29/22 Standing: // bars (CGA for all exercise for safety) Heel raises x 20 with 1 hand UE assist 4" box step ups  2 x 5 each no UE assist 4" lateral steps x 5 each no UE assist Small BOS on foam with head turns and nods 2 x 30" each no UE assist SLS on blue foam; 3 x 3" on right; 3 x 10" on left no UE assist Cone taps x 2; 5 times each leg with 2 fingertip UE assist Hip vectors 2 x 5 each leg   04/27/22 Progress note DHI 58 (54 + severe handicap) 5 x sit to stand 30.09 sec using hands to push up SLS 4" each Standing: 4" step ups x 5 each foot no UE assist Hip vectors with 1 UE assist x 5 each     04/23/22 Standing: // heel raises x 10; 1 UE assist SLS 3 x 10" each with 1 UE assist Tandem stance no UE assist x 30" each; CGA for safety Small BOS on black foam with head turns no UE assist 2 x 1' Sit to stand 2 x 5 hands on thighs to assist to pushing up 4" step ups x 5 each no UE assist  Seated: Hamstring stretch 10 x 10"     04/21/22 Standing: Head turns and nods x 10 each with eyes on target Small BOS with head turns and nods with eyes on target x 10 Tandem stance with head turns and nods with eyes on target x 10 each way 2 targets standing and head turns x 10 Rhythmic stabilization 2 x 20" Rhythmic stabilization in tandem stance x 20" each way Heel raises x 10 with both UE assist Hip abduction x 10 each with both UE assist Weighted ball trunk rotation with eyes following x 10 each   Seated: Hamstring/calf stretch with strap 5 x 20"    04/16/22 Seated:  Horizontal saccades by reading numbers x 1 minute   X1 horizontal viewing by identifying colors x 1 minute X1 vertical viewing by identifying shapes x 1 minute X1 horizontal viewing by identifying animal silhouettes x 1 minute X1 vertical viewing by counting  shapes/colors x 1 minute Calf stretch with a strap on B x 30" x 3 Standing, firm surface, eyes, open, normal BOS:  Rhythmic stabilization ant/post x 3" x 10 x 2 on each side alternating  Reaching in various direction x 1'  Heel raises x 10 x 2  Marches x 10 x  2  Mini squats x 3" x 10 x 2  04/09/22 Seated:  Horizontal saccades by reading numbers x 1 minute x 2 reps  Horizontal saccades by identifying shapes x 1 minute  X1 horizontal viewing by identifying colors x 1 minute X1 vertical viewing by identifying shapes x 1 minute X1 horizontal viewing by identifying animal silhouettes x 1 minute X1 vertical viewing by counting shapes/colors x 1 minute Calf stretch with a strap on B x 30" x 3 Standing, firm surface, eyes, open, normal BOS:  Rhythmic stabilization ant/post x 3" x 10 x 2 on each side alternating  Reaching in various direction x 1'  04/07/22 Seated:  Horizontal saccades by reading numbers x 1 minute x 3 reps  X1 horizontal viewing by identifying colors x 1 minute X1 vertical viewing by identifying shapes x 1 minute Standing, firm surface, eyes, open, normal BOS:  Rhythmic stabilization ant/post x 3" x 10 x 2 on each side alternating  Reaching in various direction   03/30/22 physical therapy evaluation and HEP instruction  Canalith Repositioning:   Gaze Adaptation:  x1 Viewing Horizontal: Position: sitting and Reps: 10 and x1 Viewing Vertical:  Position: sitting and Reps: 10 Habituation:  Seated Vertical Head Movements: number of reps: 10 and Seated Horizontal Head Movements: number of reps: 10 Other: issued for HEP  PATIENT EDUCATION: Education details: 04/16/22: updated HEP  Person educated: Patient Education method: Explanation, Demonstration, and Handouts Education comprehension: verbalized understanding, returned demonstration, verbal cues required, and tactile cues required   HOME EXERCISE PROGRAM:  Access Code: [email or web address] URL: [email or web address] Date: 03/30/2022 Prepared by: [facility]  05/06/22 - Seated Ankle Plantar Flexion with Resistance Loop  - 2 x daily - 7 x weekly - 2 sets - 10 reps 05/05/22 - Seated Toe Raise  - 3 x daily - 7 x weekly - 2 sets - 10 reps 04/16/22 - Mini Squat with Counter  Support  - 1-2 x daily - 7 x weekly - 2 sets - 10 reps - 3 hold - Heel Raises with Counter Support  - 1-2 x daily - 7 x weekly - 2 sets - 10 reps 04/09/22 - Seated Calf Stretch with Strap  - 1-2 x daily - 7 x weekly - 3 reps - 30 hold Exercises - Seated Gaze Stabilization with Head Nod  - 3 x daily - 7 x weekly - 1 sets - 10-20 reps - Seated Gaze Stabilization with Head Rotation  - 3 x daily - 7 x weekly - 1 sets - 10-20 reps  GOALS: Goals reviewed with patient? No  SHORT TERM GOALS: Target date: 04/13/2022  patient will be independent with initial HEP  Baseline: Goal status: MET  2.  Patient will self report 30% improvement to improve tolerance for functional activity   Baseline:  Goal status: MET   LONG TERM GOALS: Target date: 04/27/2022  Patient will be independent in self management strategies to improve quality of life and functional outcomes.  Baseline:  Goal status: IN PROGRESS  2.  Patient will self report 50% improvement to improve tolerance for functional activity   Baseline:  Goal status: IN PROGRESS  3.  Patient will improve score on DHI by 20 points to demostrate decreased dizziness with functional tasks (goal 40) Baseline: 60; 58 04/27/22  Goal status: IN PROGRESS  4.  Patient will remain free of falls Baseline: 2/27 no new falls Goal status: IN PROGRESS  5.  Patient will improve 5 times sit to stand score from 47.28  sec to 30 sec to demonstrate improved functional mobility and increased lower extremity strength.  Baseline: 04/27/22 30.09 sec using hands to assist up to standing Goal status: IN PROGRESS  6. (Goal added 04/27/22) patient will be able to stand SLS x 10" each leg to demonstrate improved functional balance and decreased fall risk.  Baseline: 4 sec each leg  Goal status: in progress   ASSESSMENT:  CLINICAL IMPRESSION Interventions today were geared towards LE strengthening, flexibility, and balance. Tolerated all activities still with  mild to moderate difficulty due to weakness and impaired proprioception. Still having severe difficulty on toe raises and demonstrates foot slap on heel taps due to weakness of the dorsiflexors (R>L). However, patient is a little steadier on the rhythmic stabilization. Demonstrated appropriate levels of fatigue. Moderate difficulty seen on heel raises and ankle PF with a band due to weakness. Provided moderate amount of multimodal cueing to ensure correct execution of activity with fair carry-over. To date, skilled PT is required to address the impairments and improve function. .    Eval:Patient is a 87 y.o. male who was seen today for physical therapy evaluation and treatment for dizziness. Patient presents to physical therapy with complaint of light headed feelings with position changes, turning his head and difficulty with his balance. Patient demonstrates decreased strength, balance deficits and gait abnormalities which are negatively impacting patient ability to perform ADLs and functional mobility tasks.  Patient demonstrates increased vestibular symptoms with VOR testing which is negatively impacting patient ability to perform ADLs and functional mobility tasks. Patient will benefit from skilled physical therapy services to address these deficits to improve level of function with ADLs, functional mobility tasks, and reduce risk for falls.     OBJECTIVE IMPAIRMENTS: Abnormal gait, decreased activity tolerance, decreased balance, decreased endurance, decreased mobility, difficulty walking, decreased ROM, decreased strength, dizziness, hypomobility, increased fascial restrictions, impaired perceived functional ability, impaired flexibility, and pain.   ACTIVITY LIMITATIONS: carrying, lifting, bending, standing, stairs, bathing, toileting, dressing, locomotion level, and caring for others  PARTICIPATION LIMITATIONS: meal prep, cleaning, laundry, driving, shopping, community activity, occupation, and  yard work  PERSONAL FACTORS: 1 comorbidity: neuropathy  are also affecting patient's functional outcome.   REHAB POTENTIAL: Good  CLINICAL DECISION MAKING: Stable/uncomplicated  EVALUATION COMPLEXITY: Moderate   PLAN:  PT FREQUENCY: 2x/week  PT DURATION: 4 weeks  PLANNED INTERVENTIONS: Therapeutic exercises, Therapeutic activity, Neuromuscular re-education, Balance training, Gait training, Patient/Family education, Joint manipulation, Joint mobilization, Stair training, Orthotic/Fit training, DME instructions, Aquatic Therapy, Dry Needling, Electrical stimulation, Spinal manipulation, Spinal mobilization, Cryotherapy, Moist heat, Compression bandaging, scar mobilization, Splintting, Taping, Traction, Ultrasound, Ionotophoresis '4mg'$ /ml Dexamethasone, and Manual therapy   PLAN FOR NEXT SESSION: continue with focus on balance; gait training and lower extremity strengthening. May benefit from FES/NMES to strengthen the dorsiflexors.  1:53 PM, 05/06/22 Harvie Heck. Aadya Kindler, PT, DPT, OCS Board-Certified Clinical Specialist in [place] # ([place]): [phone]

## 2022-05-12 ENCOUNTER — Ambulatory Visit (INDEPENDENT_AMBULATORY_CARE_PROVIDER_SITE_OTHER): Payer: Medicare Other | Admitting: Clinical

## 2022-05-12 ENCOUNTER — Ambulatory Visit (HOSPITAL_COMMUNITY): Payer: Medicare Other

## 2022-05-12 DIAGNOSIS — R2689 Other abnormalities of gait and mobility: Secondary | ICD-10-CM | POA: Diagnosis not present

## 2022-05-12 DIAGNOSIS — R262 Difficulty in walking, not elsewhere classified: Secondary | ICD-10-CM | POA: Diagnosis not present

## 2022-05-12 DIAGNOSIS — F419 Anxiety disorder, unspecified: Secondary | ICD-10-CM | POA: Diagnosis not present

## 2022-05-12 DIAGNOSIS — F331 Major depressive disorder, recurrent, moderate: Secondary | ICD-10-CM | POA: Diagnosis not present

## 2022-05-12 DIAGNOSIS — R42 Dizziness and giddiness: Secondary | ICD-10-CM | POA: Diagnosis not present

## 2022-05-12 NOTE — Therapy (Addendum)
OUTPATIENT PHYSICAL THERAPY VESTIBULAR TREATMENT       Patient Name: Benjamin Yang MRN: [id number] DOB:[birth year], 87 y.o., male Today's Date: 05/12/2022  END OF SESSION:  PT End of Session - 05/12/22 0825     Visit Number 12    Number of Visits 16    Date for PT Re-Evaluation 05/25/22    Authorization Type Medicare    PT Start Time 0815    PT Stop Time 0900    PT Time Calculation (min) 45 min    Activity Tolerance Patient tolerated treatment well    Behavior During Therapy [facility] for tasks assessed/performed            Past Medical History:  Diagnosis Date   Cancer ([place])    bladder   Hypertension    Prostatitis    Past Surgical History:  Procedure Laterality Date   BACK SURGERY     CHOLECYSTECTOMY N/A 01/06/2021   Procedure: LAPAROSCOPIC CHOLECYSTECTOMY;  Surgeon: Aviva Signs, MD;  Location: [facility];  Service: General;  Laterality: N/A;   CYSTOSCOPY N/A 09/15/2012   Procedure: CYSTOSCOPY;  Surgeon: Alexis Frock, MD;  Location: [facility];  Service: Urology;  Laterality: N/A;   LYMPHADENECTOMY Bilateral 09/15/2012   Procedure:  Bilateral Pelvic Lymph Node Dissection;  Surgeon: Alexis Frock, MD;  Location: [facility];  Service: Urology;  Laterality: Bilateral;   ROBOT ASSISTED LAPAROSCOPIC COMPLETE CYSTECT ILEAL CONDUIT N/A 09/15/2012   Procedure: Robotic Cystoprostatectomy, Open Ileal Conduit Diversion,  Indocyanine Green Dye Injection into Bladder ;  Surgeon: Alexis Frock, MD;  Location: [facility];  Service: Urology;  Laterality: N/A;  Robotic Cystoprostatectomy, Bilateral Pelvic Lymph Node Dissection, Open Ileal Conduit Diversion, Cysto, Indocyanine Green Dye Injection into Bladder     ROBOTIC ASSISTED LAPAROSCOPIC LYSIS OF ADHESION N/A 09/15/2012   Procedure: ROBOTIC ASSISTED LAPAROSCOPIC LYSIS OF ADHESION;  Surgeon: Alexis Frock, MD;  Location: [facility];  Service: Urology;  Laterality: N/A;   Patient Active Problem List   Diagnosis Date Noted   Anxiety 03/21/2022    Hyperglycemia 03/21/2022   Hydronephrosis with obstructing calculus 03/20/2022   Empyema of gallbladder    Acute cholecystitis 01/02/2021   Severe sepsis ([place])    Sepsis ([place]) 04/13/2018   Lobar pneumonia ([place]) 04/13/2018   UTI (urinary tract infection) 04/13/2018   Essential hypertension 04/13/2018    PCP: Dr. Manuella Ghazi REFERRING PROVIDER: Raylene Miyamoto, MD  REFERRING DIAG: dizziness  THERAPY DIAG:  Dizziness and giddiness  Difficulty in walking, not elsewhere classified  ONSET DATE: years; worsened lately  Rationale for Evaluation and Treatment: Rehabilitation  SUBJECTIVE:   SUBJECTIVE STATEMENT: Denies recent falls. Denies changes from the previous session and claims that walking on tight spots still bother him.  PERTINENT HISTORY: gallbladder removed 2022; had home health  PAIN:  Are you having pain? No  PRECAUTIONS: Fall  WEIGHT BEARING RESTRICTIONS: No  FALLS: Has patient fallen in last 6 months? Yes. Number of falls 2  LIVING ENVIRONMENT: Lives with: lives with their spouse Lives in: House/apartment Stairs: Yes: External: 1 steps; none Has following equipment at home: Walker - 2 wheeled, shower chair, Grab bars, and walking stick  PLOF: Independent with household mobility with device  PATIENT GOALS: be less tentative; have better balance; be able to walk out in the yard; walk better  OBJECTIVE:   DIAGNOSTIC FINDINGS: none in EPIC; patient reports Manuella Ghazi did CT/MRI of head all clear  COGNITION: Overall cognitive status: Within functional limits for tasks assessed   SENSATION: Neuropathy in  both foot especially left foot  MUSCLE TONE:  wfl   POSTURE:  rounded shoulders and forward head  Cervical ROM:  all wfl  Active A/PROM (deg) eval  Flexion   Extension   Right lateral flexion   Left lateral flexion   Right rotation   Left rotation   (Blank rows = not tested)  STRENGTH: not tested today  LOWER EXTREMITY MMT:   MMT Right eval  Left eval  Hip flexion    Hip abduction    Hip adduction    Hip internal rotation    Hip external rotation    Knee flexion    Knee extension    Ankle dorsiflexion    Ankle plantarflexion    Ankle inversion    Ankle eversion    (Blank rows = not tested)  BED MOBILITY:  Sit to supine Modified independence Supine to sit Modified independence  TRANSFERS: Assistive device utilized:  walking stick   Sit to stand: SBA Stand to sit: SBA Chair to chair: SBA  GAIT: Gait pattern: decreased arm swing- Right, decreased arm swing- Left, decreased step length- Right, and decreased step length- Left Distance walked: 80 ft Assistive device utilized:  walking stick Level of assistance: SBA Comments: slow gait speed; short step length  FUNCTIONAL TESTS:  5 times sit to stand: 47.28 using arms to assist up  PATIENT SURVEYS:  DHI 60; severe handicap  VESTIBULAR ASSESSMENT:  GENERAL OBSERVATION: no glassess   SYMPTOM BEHAVIOR:  Subjective history: see above  Non-Vestibular symptoms: changes in hearing  Type of dizziness: Imbalance (Disequilibrium) and Lightheadedness/Faint  Frequency: varies  Duration: varies  Aggravating factors: Induced by position change: supine to sit, Induced by motion: bending down to the ground and turning head quickly, Worse outside or in busy environment, and Moving eyes  Relieving factors: no known relieving factors  Progression of symptoms: unchanged  OCULOMOTOR EXAM:  Ocular Alignment: normal  Ocular ROM: No Limitations  Spontaneous Nystagmus: absent  Gaze-Induced Nystagmus: absent  Smooth Pursuits: intact  Saccades:   Convergence/Divergence: not tested  VESTIBULAR - OCULAR REFLEX:   Slow VOR: Positive Right  VOR Cancellation:   Head-Impulse Test: HIT Right: positive HIT Left: negative  Dynamic Visual Acuity:  not tested   POSITIONAL TESTING: Right Dix-Hallpike: no nystagmus Left Dix-Hallpike: no nystagmus  MOTION SENSITIVITY:  Motion  Sensitivity Quotient Intensity: 0 = none, 1 = Lightheaded, 2 = Mild, 3 = Moderate, 4 = Severe, 5 = Vomiting  Intensity  1. Sitting to supine 0  2. Supine to L side   3. Supine to R side   4. Supine to sitting 1  5. L Hallpike-Dix 0  6. Up from L  1  7. R Hallpike-Dix 0  8. Up from R  1  9. Sitting, head tipped to L knee 0  10. Head up from L knee 0  11. Sitting, head tipped to R knee 0  12. Head up from R knee 0  13. Sitting head turns x5 0  14.Sitting head nods x5 0  15. In stance, 180 turn to L    16. In stance, 180 turn to R     OTHOSTATICS: not done  FUNCTIONAL GAIT:    VESTIBULAR TREATMENT:  DATE:  05/12/22 Seated: Toe raises x 20 Soleus slant board stretch x 30" x 3 Standing: // bars (CGA for all exercise for safety) Gastrocnemius slant board stretch x 30" x 3 Ankle PF, GTB x 10 x  2 each Heel raises x 20 with 1 hand UE assist Small BOS on foam with head turns and nods 2 x 30" each no UE assist Rhythmic stabilization, firm surface, eyes open, ant/post x 3" x 10 x 2 on each side alternating Standing on blue foam, eyes closed, normal BOS x 30" x 2 Cone taps x 2; 10 times each leg with 1 fingertip UE assist Heel taps on a floor target x 10 x 2 on each, with 2 fingertip UE assist Hip vectors x 10 each leg  05/06/22 Seated: Toe raises x 20 Standing: // bars (CGA for all exercise for safety) Gastrocnemius slant board stretch x 30" x 3 Ankle PF, GTB x 10 x  2 each Heel raises x 20 with 1 hand UE assist Normal BOS on foam with while reaching in various directions x 10 x 2 on each side, no UE assist Rhythmic stabilization, firm surface, eyes open, ant/post x 3" x 10 x 2 on each side alternating Standing on blue foam, eyes closed, normal BOS x 30" x 2 Cone taps x 2; 10 times each leg with 1 fingertip UE assist Heel taps on a floor target x 10 x 2 on each, with 2 fingertip  UE assist Hip vectors x 10 each leg  05/05/22 Seated: Toe raises x 20 Standing: // bars (CGA for all exercise for safety) Heel raises x 20 with 1 hand UE assist Small BOS on foam with head turns and nods 2 x 30" each no UE assist Rhythmic stabilization, firm surface, eyes open, ant/post x 3" x 10 x 2 on each side alternating Standing on blue foam, eyes closed, normal BOS x 30" x 2 Cone taps x 2; 10 times each leg with 2 fingertip UE assist Heel taps on a floor target x 10 x 2 on each, 1 UE assist Hip vectors 2 x 5 each leg  04/29/22 Standing: // bars (CGA for all exercise for safety) Heel raises x 20 with 1 hand UE assist 4" box step ups 2 x 5 each no UE assist 4" lateral steps x 5 each no UE assist Small BOS on foam with head turns and nods 2 x 30" each no UE assist SLS on blue foam; 3 x 3" on right; 3 x 10" on left no UE assist Cone taps x 2; 5 times each leg with 2 fingertip UE assist Hip vectors 2 x 5 each leg   04/27/22 Progress note DHI 58 (54 + severe handicap) 5 x sit to stand 30.09 sec using hands to push up SLS 4" each Standing: 4" step ups x 5 each foot no UE assist Hip vectors with 1 UE assist x 5 each   04/23/22 Standing: // heel raises x 10; 1 UE assist SLS 3 x 10" each with 1 UE assist Tandem stance no UE assist x 30" each; CGA for safety Small BOS on black foam with head turns no UE assist 2 x 1' Sit to stand 2 x 5 hands on thighs to assist to pushing up 4" step ups x 5 each no UE assist  Seated: Hamstring stretch 10 x 10"   04/21/22 Standing: Head turns and nods x 10 each with eyes on target Small BOS with head turns  and nods with eyes on target x 10 Tandem stance with head turns and nods with eyes on target x 10 each way 2 targets standing and head turns x 10 Rhythmic stabilization 2 x 20" Rhythmic stabilization in tandem stance x 20" each way Heel raises x 10 with both UE assist Hip abduction x 10 each with both UE assist Weighted ball trunk  rotation with eyes following x 10 each   Seated: Hamstring/calf stretch with strap 5 x 20"  04/16/22 Seated:  Horizontal saccades by reading numbers x 1 minute   X1 horizontal viewing by identifying colors x 1 minute X1 vertical viewing by identifying shapes x 1 minute X1 horizontal viewing by identifying animal silhouettes x 1 minute X1 vertical viewing by counting shapes/colors x 1 minute Calf stretch with a strap on B x 30" x 3 Standing, firm surface, eyes, open, normal BOS:  Rhythmic stabilization ant/post x 3" x 10 x 2 on each side alternating  Reaching in various direction x 1'  Heel raises x 10 x 2  Marches x 10 x 2  Mini squats x 3" x 10 x 2  04/09/22 Seated:  Horizontal saccades by reading numbers x 1 minute x 2 reps  Horizontal saccades by identifying shapes x 1 minute  X1 horizontal viewing by identifying colors x 1 minute X1 vertical viewing by identifying shapes x 1 minute X1 horizontal viewing by identifying animal silhouettes x 1 minute X1 vertical viewing by counting shapes/colors x 1 minute Calf stretch with a strap on B x 30" x 3 Standing, firm surface, eyes, open, normal BOS:  Rhythmic stabilization ant/post x 3" x 10 x 2 on each side alternating  Reaching in various direction x 1'  04/07/22 Seated:  Horizontal saccades by reading numbers x 1 minute x 3 reps  X1 horizontal viewing by identifying colors x 1 minute X1 vertical viewing by identifying shapes x 1 minute Standing, firm surface, eyes, open, normal BOS:  Rhythmic stabilization ant/post x 3" x 10 x 2 on each side alternating  Reaching in various direction   03/30/22 physical therapy evaluation and HEP instruction  [place] Repositioning:   Gaze Adaptation:  x1 Viewing Horizontal: Position: sitting and Reps: 10 and x1 Viewing Vertical:  Position: sitting and Reps: 10 Habituation:  Seated Vertical Head Movements: number of reps: 10 and Seated Horizontal Head Movements: number of reps: 10 Other:  issued for HEP  PATIENT EDUCATION: Education details: 04/16/22: updated HEP  Person educated: Patient Education method: Explanation, Demonstration, and Handouts Education comprehension: verbalized understanding, returned demonstration, verbal cues required, and tactile cues required   HOME EXERCISE PROGRAM:  Access Code: [email or web address] URL: [email or web address] Date: 03/30/2022 Prepared by: [facility]  05/06/22 - Seated Ankle Plantar Flexion with Resistance Loop  - 2 x daily - 7 x weekly - 2 sets - 10 reps 05/05/22 - Seated Toe Raise  - 3 x daily - 7 x weekly - 2 sets - 10 reps 04/16/22 - Mini Squat with Counter Support  - 1-2 x daily - 7 x weekly - 2 sets - 10 reps - 3 hold - Heel Raises with Counter Support  - 1-2 x daily - 7 x weekly - 2 sets - 10 reps 04/09/22 - Seated Calf Stretch with Strap  - 1-2 x daily - 7 x weekly - 3 reps - 30 hold Exercises - Seated Gaze Stabilization with Head Nod  - 3 x daily - 7 x weekly - 1 sets - 10-20 reps -  Seated Gaze Stabilization with Head Rotation  - 3 x daily - 7 x weekly - 1 sets - 10-20 reps  GOALS: Goals reviewed with patient? No  SHORT TERM GOALS: Target date: 04/13/2022  patient will be independent with initial HEP  Baseline: Goal status: MET  2.  Patient will self report 30% improvement to improve tolerance for functional activity   Baseline:  Goal status: MET   LONG TERM GOALS: Target date: 04/27/2022  Patient will be independent in self management strategies to improve quality of life and functional outcomes.  Baseline:  Goal status: IN PROGRESS  2.  Patient will self report 50% improvement to improve tolerance for functional activity   Baseline:  Goal status: IN PROGRESS  3.  Patient will improve score on DHI by 20 points to demostrate decreased dizziness with functional tasks (goal 40) Baseline: 60; 58 04/27/22  Goal status: IN PROGRESS  4.  Patient will remain free of falls Baseline: 2/27 no new  falls Goal status: IN PROGRESS  5.  Patient will improve 5 times sit to stand score from 47.28  sec to 30 sec to demonstrate improved functional mobility and increased lower extremity strength.  Baseline: 04/27/22 30.09 sec using hands to assist up to standing Goal status: IN PROGRESS  6. (Goal added 04/27/22) patient will be able to stand SLS x 10" each leg to demonstrate improved functional balance and decreased fall risk.  Baseline: 4 sec each leg  Goal status: in progress   ASSESSMENT:  CLINICAL IMPRESSION Interventions today were geared towards LE strengthening, flexibility, and balance. Tolerated all activities still with mild to moderate difficulty due to weakness and impaired proprioception. Still having severe difficulty on toe raises and demonstrates moderate foot slap on heel taps on the R and mild foot slap on the L due to weakness of the dorsiflexors (R>L). However, patient is a little steadier on the rhythmic stabilization. Due to impaired proprioception, patient has moderate difficulty with reaching while standing. Hip vectors were not done today due to insufficient time (patient has to empty his colostomy bag). Demonstrated appropriate levels of fatigue. Still noted moderate difficulty seen on heel raises and ankle PF with a band due to weakness. Provided moderate amount of multimodal cueing to ensure correct execution of activity with fair carry-over. To date, skilled PT is required to address the impairments and improve function. .    Eval:Patient is a 87 y.o. male who was seen today for physical therapy evaluation and treatment for dizziness. Patient presents to physical therapy with complaint of light headed feelings with position changes, turning his head and difficulty with his balance. Patient demonstrates decreased strength, balance deficits and gait abnormalities which are negatively impacting patient ability to perform ADLs and functional mobility tasks.  Patient demonstrates  increased vestibular symptoms with VOR testing which is negatively impacting patient ability to perform ADLs and functional mobility tasks. Patient will benefit from skilled physical therapy services to address these deficits to improve level of function with ADLs, functional mobility tasks, and reduce risk for falls.     OBJECTIVE IMPAIRMENTS: Abnormal gait, decreased activity tolerance, decreased balance, decreased endurance, decreased mobility, difficulty walking, decreased ROM, decreased strength, dizziness, hypomobility, increased fascial restrictions, impaired perceived functional ability, impaired flexibility, and pain.   ACTIVITY LIMITATIONS: carrying, lifting, bending, standing, stairs, bathing, toileting, dressing, locomotion level, and caring for others  PARTICIPATION LIMITATIONS: meal prep, cleaning, laundry, driving, shopping, community activity, occupation, and yard work  PERSONAL FACTORS: 1 comorbidity: neuropathy  are  also affecting patient's functional outcome.   REHAB POTENTIAL: Good  CLINICAL DECISION MAKING: Stable/uncomplicated  EVALUATION COMPLEXITY: Moderate   PLAN:  PT FREQUENCY: 2x/week  PT DURATION: 4 weeks  PLANNED INTERVENTIONS: Therapeutic exercises, Therapeutic activity, Neuromuscular re-education, Balance training, Gait training, Patient/Family education, Joint manipulation, Joint mobilization, Stair training, Orthotic/Fit training, DME instructions, Aquatic Therapy, Dry Needling, Electrical stimulation, Spinal manipulation, Spinal mobilization, Cryotherapy, Moist heat, Compression bandaging, scar mobilization, Splintting, Taping, Traction, Ultrasound, Ionotophoresis '4mg'$ /ml Dexamethasone, and Manual therapy   PLAN FOR NEXT SESSION: continue with focus on balance; gait training and lower extremity strengthening. May benefit from FES/NMES to strengthen the dorsiflexors.  8:31 AM, 05/12/22 Harvie Heck. Kahle Mcqueen, PT, DPT, OCS Board-Certified Clinical Specialist  in [place] # ([place]): [phone]

## 2022-05-12 NOTE — Progress Notes (Signed)
IN PERSON   I connected with Benjamin Yang on 05/12/22 at  1:00 PM EDT by a video enabled telemedicine application and verified that I am speaking with the correct person using two identifiers.  Location: Patient: Office Provider: Office    I discussed the limitations of evaluation and management by telemedicine and the availability of in person appointments. The patient expressed understanding and agreed to proceed.       Comprehensive Clinical Assessment (CCA) Note  05/12/2022 LYON ABELLA [id number]  Chief Complaint: Difficulty with high anxiety and depressed mood  Visit Diagnosis: Recurrent Moderate Depression with anxiety    CCA Screening, Triage and Referral (STR)  Patient Reported Information How did you hear about [place]? No data recorded Referral name: No data recorded Referral phone number: No data recorded  Whom do you see for routine medical problems? No data recorded Practice/Facility Name: No data recorded Practice/Facility Phone Number: No data recorded Name of Contact: No data recorded Contact Number: No data recorded Contact Fax Number: No data recorded Prescriber Name: No data recorded Prescriber Address (if known): No data recorded  What Is the Reason for Your Visit/Call Today? No data recorded How Long Has This Been Causing You Problems? No data recorded What Do You Feel Would Help You the Most Today? No data recorded  Have You Recently Been in Any Inpatient Treatment (Hospital/Detox/Crisis Center/28-Day Program)? No data recorded Name/Location of Program/Hospital:No data recorded How Long Were You There? No data recorded When Were You Discharged? No data recorded  Have You Ever Received Services From [facility] Before? No data recorded Who Do You See at [facility]? No data recorded  Have You Recently Had Any Thoughts About Hurting Yourself? No data recorded Are You Planning to Commit Suicide/Harm Yourself At This time? No data  recorded  Have you Recently Had Thoughts About [place]? No data recorded Explanation: No data recorded  Have You Used Any Alcohol or Drugs in the Past 24 Hours? No data recorded How Long Ago Did You Use Drugs or Alcohol? No data recorded What Did You Use and How Much? No data recorded  Do You Currently Have a Therapist/Psychiatrist? No data recorded Name of Therapist/Psychiatrist: No data recorded  Have You Been Recently Discharged From Any Office Practice or Programs? No data recorded Explanation of Discharge From Practice/Program: No data recorded    CCA Screening Triage Referral Assessment Type of Contact: No data recorded Is this Initial or Reassessment? No data recorded Date Telepsych consult ordered in [facility]:  No data recorded Time Telepsych consult ordered in [facility]:  No data recorded  Patient Reported Information Reviewed? No data recorded Patient Left Without Being Seen? No data recorded Reason for Not Completing Assessment: No data recorded  Collateral Involvement: No data recorded  Does Patient Have a Home? No data recorded Name and Contact of Legal Guardian: No data recorded If Minor and Not Living with Parent(s), Who has Custody? No data recorded Is CPS involved or ever been involved? No data recorded Is APS involved or ever been involved? No data recorded  Patient Determined To Be At Risk for Harm To Self or Others Based on Review of Patient Reported Information or Presenting Complaint? No data recorded Method: No data recorded Availability of Means: No data recorded Intent: No data recorded Notification Required: No data recorded Additional Information for Danger to Others Potential: No data recorded Additional Comments for Danger to Others Potential: No data recorded Are There Guns or  Other Weapons in [place]? No data recorded Types of Guns/Weapons: No data recorded Are These Weapons Safely Secured?                             No data recorded Who Could Verify You Are Able To Have These Secured: No data recorded Do You Have any Outstanding Charges, Pending Court Dates, Parole/Probation? No data recorded Contacted To Inform of Risk of Harm To Self or Others: No data recorded  Location of Assessment: No data recorded  Does Patient Present under Involuntary Commitment? No data recorded IVC Papers Initial File Date: No data recorded  [place] of Residence: No data recorded  Patient Currently Receiving the Following Services: No data recorded  Determination of Need: No data recorded  Options For Referral: No data recorded    CCA Biopsychosocial Intake/Chief Complaint:  The patient noted he was referred by a therapist friend for further evaluation for [place] treatment services.  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 years ago and had prostate removal and sex is over and even though i have sexual urges but cant get an errection, and me and wife have had a open marriage, one of the biggest problems is i just am not sure what to do with myself all the things i use to do i cant do anymore, i can get around with the walking stick , but i am not painting and i feel like thats what i need to do but i cant bring myself to do i just dont feel well enough, i am going to PT for balance problems"   Patient Reported Schizophrenia/Schizoaffective Diagnosis in Past: No   Strengths: Painting / Artist  Preferences: Watching Tv and [employer] , listening to [employer] and opera  Abilities: Painting   Type of Services Patient Feels are Needed: Individual therapy / Medication Management currently through PCP   Initial Clinical Notes/Concerns: The patient notes no prior counseling before coming in for assessment today. The patient is currently prescribed Lorazapm medication for Mh. Notes no current S/I or H/I   Mental Health  Symptoms Depression:   Change in energy/activity; Difficulty Concentrating; Fatigue; Hopelessness; Irritability; Worthlessness; Tearfulness   Duration of Depressive symptoms:  Greater than two weeks   Mania:   None   Anxiety:    Tension; Worrying; Restlessness; Irritability; Fatigue; Difficulty concentrating   Psychosis:   None   Duration of Psychotic symptoms: NA   Trauma:   None   Obsessions:   None   Compulsions:   None   Inattention:   None   Hyperactivity/Impulsivity:   None   Oppositional/Defiant Behaviors:   None   Emotional Irregularity:   None   Other Mood/Personality Symptoms:   NA    Mental Status Exam Appearance and self-care  Stature:   Tall   Weight:   Average weight   Clothing:   Casual   Grooming:   Normal   Cosmetic use:   None   Posture/gait:   Normal   Motor activity:  Patient is mobile with assistance of walking cane  Sensorium  Attention:   Normal   Concentration:   Normal   Orientation:   X5   Recall/memory:   Defective in Short-term   Affect and Mood  Affect:   Appropriate   Mood:   Depressed; Anxious   Relating  Eye contact:   Normal   Facial expression:   Responsive   Attitude toward examiner:   Cooperative   Thought and Language  Speech flow:  Normal   Thought content:   Appropriate to Mood and Circumstances   Preoccupation:   None   Hallucinations:   None   Organization:  Logical   Transport planner of Knowledge:   Good   Intelligence:   Average   Abstraction:   Normal   Judgement:   Good   Reality Testing:   Realistic   Insight:   Good   Decision Making:   Normal   Social Functioning  Social Maturity:   Responsible   Social Judgement:   Normal   Stress  Stressors:   Illness; Transitions (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 Has Patient ever Been in the [employer]?: No  Education: Education Is Patient Currently Attending School?: No Last Grade Completed: 12 Name of High School: [employer]  graduated in 1958 Did You Graduate From [employer]?: Yes Did [place]?: Yes What Type of College Degree Do you Have?: The patient notes he [employer] and has a 4 year degree (BFA in ART) Did [place]?: No What Was Your Major?: Art Did You Have Any Special Interests In School?: NA Did You Have An Individualized Education Program (IIEP): No Did You Have Any Difficulty At School?: No Patient's Education Has Been Impacted by Current Illness: No   CCA Family/Childhood History Family and Relationship History: Family history Marital  status: Married Number of Years Married: 49 What types of issues is patient dealing with in the relationship?: The patient notes 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 Does patient have children?: Yes How many children?: 1 How is patient's relationship with their children?: The patient notes having just 1 son and he is very close with him.  Childhood  History:  Childhood History By whom was/is the patient raised?: Both parents Additional childhood history information: NA Description of patient's relationship with caregiver when they were a child: The patient notes he had a fantastic childhood and was close with both parents . Patient's description of current relationship with people who raised him/her: Both of the patients parents are Deceased. How were you disciplined when you got in trouble as a child/adolescent?: Groundings / Spankings Does patient have siblings?: Yes Number of Siblings: 1 Description of patient's current relationship with siblings: Sister deceased died around 2 years ago. Did patient suffer any verbal/emotional/physical/sexual abuse as a child?: No Did patient suffer from severe childhood neglect?: No Has patient ever been sexually abused/assaulted/raped as an adolescent or adult?: No Was the patient ever a victim of a crime or a disaster?: No Witnessed domestic violence?: No Has patient been affected by domestic violence as an adult?: No  Child/Adolescent Assessment:     CCA Substance Use Alcohol/Drug Use: Alcohol / Drug Use Pain Medications: See MAR Prescriptions: See MAR Over the Counter: Ibprofin. Iron tablet . History of alcohol / drug use?: No  history of alcohol / drug abuse Longest period of sobriety (when/how long): NA                         ASAM's:  Six Dimensions of Multidimensional Assessment  Dimension 1:  Acute Intoxication and/or Withdrawal Potential:      Dimension 2:  Biomedical Conditions and Complications:      Dimension 3:  Emotional, Behavioral, or Cognitive Conditions and Complications:     Dimension 4:  Readiness to Change:     Dimension 5:  Relapse, Continued use, or Continued Problem Potential:     Dimension 6:  Recovery/Living Environment:     ASAM Severity Score:    ASAM Recommended Level of Treatment:     Substance use Disorder (SUD)    Recommendations for Services/Supports/Treatments: Recommendations for Services/Supports/Treatments Recommendations For Services/Supports/Treatments: Medication Management, Individual Therapy  DSM5 Diagnoses: Patient Active Problem List   Diagnosis Date Noted   Anxiety 03/21/2022   Hyperglycemia 03/21/2022   Hydronephrosis with obstructing calculus 03/20/2022   Empyema of gallbladder    Acute cholecystitis 01/02/2021   Severe sepsis ([facility])    Sepsis ([place]) 04/13/2018   Lobar pneumonia ([place]) 04/13/2018   UTI (urinary tract infection) 04/13/2018   Essential hypertension 04/13/2018    Patient Centered Plan: Patient is on the following Treatment Plan(s):  Will be completed if patient decides to move forward with counseling in the next counseling session.    Referrals to Alternative Service(s): Referred to Alternative Service(s):   Place:   Date:   Time:    Referred to Alternative Service(s):   Place:   Date:   Time:    Referred to Alternative Service(s):   Place:   Date:   Time:    Referred to Alternative Service(s):   Place:   Date:   Time:      Collaboration of Care: Overview of patient med management from PCP.  Patient/Guardian was advised Release of Information must be obtained prior to any record release in order to collaborate their care  with an outside provider. Patient/Guardian was advised if they have not already done so to contact the registration department to sign all necessary forms in order for [place] to release information regarding their care.   Consent: Patient/Guardian gives verbal consent for treatment and assignment of benefits for services provided during this  visit. Patient/Guardian expressed understanding and agreed to proceed.   I discussed the assessment and treatment plan with the patient. The patient was provided an opportunity to ask questions and all were answered. The patient agreed with the plan and demonstrated an understanding of the instructions.   The patient was advised to call back or seek an in-person evaluation if the symptoms worsen or if the condition fails to improve as anticipated.  I provided 60 minutes of non-face-to-face time during this encounter.  Lennox Grumbles, LCSW  05/12/2022

## 2022-05-14 ENCOUNTER — Ambulatory Visit (HOSPITAL_COMMUNITY): Payer: Medicare Other

## 2022-05-14 DIAGNOSIS — R2689 Other abnormalities of gait and mobility: Secondary | ICD-10-CM | POA: Diagnosis not present

## 2022-05-14 DIAGNOSIS — R262 Difficulty in walking, not elsewhere classified: Secondary | ICD-10-CM

## 2022-05-14 DIAGNOSIS — R42 Dizziness and giddiness: Secondary | ICD-10-CM

## 2022-05-14 NOTE — Therapy (Addendum)
OUTPATIENT PHYSICAL THERAPY VESTIBULAR TREATMENT       Patient Name: Benjamin Yang MRN: [id number] DOB:09-30-1935, 87 y.o., male Today's Date: 05/14/2022  END OF SESSION:  PT End of Session - 05/14/22 0905     Visit Number 13    Number of Visits 16    Date for PT Re-Evaluation 05/25/22    Authorization Type Medicare    Progress Note Due on Visit 16    PT Start Time 0905    PT Stop Time 0945    PT Time Calculation (min) 40 min    Activity Tolerance Patient tolerated treatment well    Behavior During Therapy [facility] for tasks assessed/performed            Past Medical History:  Diagnosis Date   Cancer ([place])    bladder   Hypertension    Prostatitis    Past Surgical History:  Procedure Laterality Date   BACK SURGERY     CHOLECYSTECTOMY N/A 01/06/2021   Procedure: LAPAROSCOPIC CHOLECYSTECTOMY;  Surgeon: Aviva Signs, MD;  Location: [facility];  Service: General;  Laterality: N/A;   CYSTOSCOPY N/A 09/15/2012   Procedure: CYSTOSCOPY;  Surgeon: Alexis Frock, MD;  Location: [facility];  Service: Urology;  Laterality: N/A;   LYMPHADENECTOMY Bilateral 09/15/2012   Procedure:  Bilateral Pelvic Lymph Node Dissection;  Surgeon: Alexis Frock, MD;  Location: [facility];  Service: Urology;  Laterality: Bilateral;   ROBOT ASSISTED LAPAROSCOPIC COMPLETE CYSTECT ILEAL CONDUIT N/A 09/15/2012   Procedure: Robotic Cystoprostatectomy, Open Ileal Conduit Diversion,  Indocyanine Green Dye Injection into Bladder ;  Surgeon: Alexis Frock, MD;  Location: [facility];  Service: Urology;  Laterality: N/A;  Robotic Cystoprostatectomy, Bilateral Pelvic Lymph Node Dissection, Open Ileal Conduit Diversion, Cysto, Indocyanine Green Dye Injection into Bladder     ROBOTIC ASSISTED LAPAROSCOPIC LYSIS OF ADHESION N/A 09/15/2012   Procedure: ROBOTIC ASSISTED LAPAROSCOPIC LYSIS OF ADHESION;  Surgeon: Alexis Frock, MD;  Location: [facility];  Service: Urology;  Laterality: N/A;   Patient Active Problem List   Diagnosis  Date Noted   Anxiety 03/21/2022   Hyperglycemia 03/21/2022   Hydronephrosis with obstructing calculus 03/20/2022   Empyema of gallbladder    Acute cholecystitis 01/02/2021   Severe sepsis ([place])    Sepsis ([place]) 04/13/2018   Lobar pneumonia ([place]) 04/13/2018   UTI (urinary tract infection) 04/13/2018   Essential hypertension 04/13/2018    PCP: Dr. Manuella Ghazi REFERRING PROVIDER: Raylene Miyamoto, MD  REFERRING DIAG: dizziness  THERAPY DIAG:  Dizziness and giddiness  Difficulty in walking, not elsewhere classified  ONSET DATE: years; worsened lately  Rationale for Evaluation and Treatment: Rehabilitation  SUBJECTIVE:   SUBJECTIVE STATEMENT: No new falls; reports very little vertigo type episodes; trying to build up my strength; was able to walk x 15 min yesterday but it "wore me out"; reports discouraged because he feels he is only making small improvements; "Will I ever get over this?"   PERTINENT HISTORY: gallbladder removed 2022; had home health  PAIN:  Are you having pain? No  PRECAUTIONS: Fall  WEIGHT BEARING RESTRICTIONS: No  FALLS: Has patient fallen in last 6 months? Yes. Number of falls 2  LIVING ENVIRONMENT: Lives with: lives with their spouse Lives in: House/apartment Stairs: Yes: External: 1 steps; none Has following equipment at home: Gilford Rile - 2 wheeled, shower chair, Grab bars, and walking stick  PLOF: Independent with household mobility with device  PATIENT GOALS: be less tentative; have better balance; be able to walk out in the yard;  walk better  OBJECTIVE:   DIAGNOSTIC FINDINGS: none in EPIC; patient reports Manuella Ghazi did CT/MRI of head all clear  COGNITION: Overall cognitive status: Within functional limits for tasks assessed   SENSATION: Neuropathy in both foot especially left foot  MUSCLE TONE:  wfl   POSTURE:  rounded shoulders and forward head  Cervical ROM:  all wfl  Active A/PROM (deg) eval  Flexion   Extension   Right lateral flexion    Left lateral flexion   Right rotation   Left rotation   (Blank rows = not tested)  STRENGTH: not tested today  LOWER EXTREMITY MMT:   MMT Right eval Left eval  Hip flexion    Hip abduction    Hip adduction    Hip internal rotation    Hip external rotation    Knee flexion    Knee extension    Ankle dorsiflexion    Ankle plantarflexion    Ankle inversion    Ankle eversion    (Blank rows = not tested)  BED MOBILITY:  Sit to supine Modified independence Supine to sit Modified independence  TRANSFERS: Assistive device utilized:  walking stick   Sit to stand: SBA Stand to sit: SBA Chair to chair: SBA  GAIT: Gait pattern: decreased arm swing- Right, decreased arm swing- Left, decreased step length- Right, and decreased step length- Left Distance walked: 80 ft Assistive device utilized:  walking stick Level of assistance: SBA Comments: slow gait speed; short step length  FUNCTIONAL TESTS:  5 times sit to stand: 47.28 using arms to assist up  PATIENT SURVEYS:  DHI 60; severe handicap  VESTIBULAR ASSESSMENT:  GENERAL OBSERVATION: no glassess   SYMPTOM BEHAVIOR:  Subjective history: see above  Non-Vestibular symptoms: changes in hearing  Type of dizziness: Imbalance (Disequilibrium) and Lightheadedness/Faint  Frequency: varies  Duration: varies  Aggravating factors: Induced by position change: supine to sit, Induced by motion: bending down to the ground and turning head quickly, Worse outside or in busy environment, and Moving eyes  Relieving factors: no known relieving factors  Progression of symptoms: unchanged  OCULOMOTOR EXAM:  Ocular Alignment: normal  Ocular ROM: No Limitations  Spontaneous Nystagmus: absent  Gaze-Induced Nystagmus: absent  Smooth Pursuits: intact  Saccades:   Convergence/Divergence: not tested  VESTIBULAR - OCULAR REFLEX:   Slow VOR: Positive Right  VOR Cancellation:   Head-Impulse Test: HIT Right: positive HIT Left:  negative  Dynamic Visual Acuity:  not tested   POSITIONAL TESTING: Right Dix-Hallpike: no nystagmus Left Dix-Hallpike: no nystagmus  MOTION SENSITIVITY:  Motion Sensitivity Quotient Intensity: 0 = none, 1 = Lightheaded, 2 = Mild, 3 = Moderate, 4 = Severe, 5 = Vomiting  Intensity  1. Sitting to supine 0  2. Supine to L side   3. Supine to R side   4. Supine to sitting 1  5. L Hallpike-Dix 0  6. Up from L  1  7. R Hallpike-Dix 0  8. Up from R  1  9. Sitting, head tipped to L knee 0  10. Head up from L knee 0  11. Sitting, head tipped to R knee 0  12. Head up from R knee 0  13. Sitting head turns x5 0  14.Sitting head nods x5 0  15. In stance, 180 turn to L    16. In stance, 180 turn to R     OTHOSTATICS: not done  FUNCTIONAL GAIT:    VESTIBULAR TREATMENT:  DATE:  05/14/22 Seated: Toe raises 2 x 10 each (individual) Hamstring stretch with strap 10 x 10"  Sit to stand 2 x 5 using hands on knee to push up to stand; chair with cushion in seat  Standing: (in // bars) On blue foam pad head nods and turns x 10 each; CGA for safety 2" step ups no UE assist 2 x 5 each leg with CGA Cones Toe taps x 2 cones; tap each cone once 2 sets of 5  05/12/22 Seated: Toe raises x 20 Soleus slant board stretch x 30" x 3 Standing: // bars (CGA for all exercise for safety) Gastrocnemius slant board stretch x 30" x 3 Ankle PF, GTB x 10 x  2 each Heel raises x 20 with 1 hand UE assist Small BOS on foam with head turns and nods 2 x 30" each no UE assist Rhythmic stabilization, firm surface, eyes open, ant/post x 3" x 10 x 2 on each side alternating Standing on blue foam, eyes closed, normal BOS x 30" x 2 Cone taps x 2; 10 times each leg with 1 fingertip UE assist Heel taps on a floor target x 10 x 2 on each, with 2 fingertip UE assist Hip vectors x 10 each leg  05/06/22 Seated: Toe raises x  20 Standing: // bars (CGA for all exercise for safety) Gastrocnemius slant board stretch x 30" x 3 Ankle PF, GTB x 10 x  2 each Heel raises x 20 with 1 hand UE assist Normal BOS on foam with while reaching in various directions x 10 x 2 on each side, no UE assist Rhythmic stabilization, firm surface, eyes open, ant/post x 3" x 10 x 2 on each side alternating Standing on blue foam, eyes closed, normal BOS x 30" x 2 Cone taps x 2; 10 times each leg with 1 fingertip UE assist Heel taps on a floor target x 10 x 2 on each, with 2 fingertip UE assist Hip vectors x 10 each leg  05/05/22 Seated: Toe raises x 20 Standing: // bars (CGA for all exercise for safety) Heel raises x 20 with 1 hand UE assist Small BOS on foam with head turns and nods 2 x 30" each no UE assist Rhythmic stabilization, firm surface, eyes open, ant/post x 3" x 10 x 2 on each side alternating Standing on blue foam, eyes closed, normal BOS x 30" x 2 Cone taps x 2; 10 times each leg with 2 fingertip UE assist Heel taps on a floor target x 10 x 2 on each, 1 UE assist Hip vectors 2 x 5 each leg  04/29/22 Standing: // bars (CGA for all exercise for safety) Heel raises x 20 with 1 hand UE assist 4" box step ups 2 x 5 each no UE assist 4" lateral steps x 5 each no UE assist Small BOS on foam with head turns and nods 2 x 30" each no UE assist SLS on blue foam; 3 x 3" on right; 3 x 10" on left no UE assist Cone taps x 2; 5 times each leg with 2 fingertip UE assist Hip vectors 2 x 5 each leg   04/27/22 Progress note DHI 58 (54 + severe handicap) 5 x sit to stand 30.09 sec using hands to push up SLS 4" each Standing: 4" step ups x 5 each foot no UE assist Hip vectors with 1 UE assist x 5 each   04/23/22 Standing: // heel raises x 10; 1 UE assist  SLS 3 x 10" each with 1 UE assist Tandem stance no UE assist x 30" each; CGA for safety Small BOS on black foam with head turns no UE assist 2 x 1' Sit to stand 2 x 5 hands on  thighs to assist to pushing up 4" step ups x 5 each no UE assist  Seated: Hamstring stretch 10 x 10"   04/21/22 Standing: Head turns and nods x 10 each with eyes on target Small BOS with head turns and nods with eyes on target x 10 Tandem stance with head turns and nods with eyes on target x 10 each way 2 targets standing and head turns x 10 Rhythmic stabilization 2 x 20" Rhythmic stabilization in tandem stance x 20" each way Heel raises x 10 with both UE assist Hip abduction x 10 each with both UE assist Weighted ball trunk rotation with eyes following x 10 each   Seated: Hamstring/calf stretch with strap 5 x 20"  04/16/22 Seated:  Horizontal saccades by reading numbers x 1 minute   X1 horizontal viewing by identifying colors x 1 minute X1 vertical viewing by identifying shapes x 1 minute X1 horizontal viewing by identifying animal silhouettes x 1 minute X1 vertical viewing by counting shapes/colors x 1 minute Calf stretch with a strap on B x 30" x 3 Standing, firm surface, eyes, open, normal BOS:  Rhythmic stabilization ant/post x 3" x 10 x 2 on each side alternating  Reaching in various direction x 1'  Heel raises x 10 x 2  Marches x 10 x 2  Mini squats x 3" x 10 x 2  04/09/22 Seated:  Horizontal saccades by reading numbers x 1 minute x 2 reps  Horizontal saccades by identifying shapes x 1 minute  X1 horizontal viewing by identifying colors x 1 minute X1 vertical viewing by identifying shapes x 1 minute X1 horizontal viewing by identifying animal silhouettes x 1 minute X1 vertical viewing by counting shapes/colors x 1 minute Calf stretch with a strap on B x 30" x 3 Standing, firm surface, eyes, open, normal BOS:  Rhythmic stabilization ant/post x 3" x 10 x 2 on each side alternating  Reaching in various direction x 1'  04/07/22 Seated:  Horizontal saccades by reading numbers x 1 minute x 3 reps  X1 horizontal viewing by identifying colors x 1 minute X1 vertical  viewing by identifying shapes x 1 minute Standing, firm surface, eyes, open, normal BOS:  Rhythmic stabilization ant/post x 3" x 10 x 2 on each side alternating  Reaching in various direction   03/30/22 physical therapy evaluation and HEP instruction  Canalith Repositioning:   Gaze Adaptation:  x1 Viewing Horizontal: Position: sitting and Reps: 10 and x1 Viewing Vertical:  Position: sitting and Reps: 10 Habituation:  Seated Vertical Head Movements: number of reps: 10 and Seated Horizontal Head Movements: number of reps: 10 Other: issued for HEP  PATIENT EDUCATION: Education details: 04/16/22: updated HEP  Person educated: Patient Education method: Explanation, Demonstration, and Handouts Education comprehension: verbalized understanding, returned demonstration, verbal cues required, and tactile cues required   HOME EXERCISE PROGRAM:  Access Code: [email or web address] URL: [email or web address] Date: 03/30/2022 Prepared by: [facility]  05/06/22 - Seated Ankle Plantar Flexion with Resistance Loop  - 2 x daily - 7 x weekly - 2 sets - 10 reps 05/05/22 - Seated Toe Raise  - 3 x daily - 7 x weekly - 2 sets - 10 reps 04/16/22 - Mini Squat with Counter Support  -  1-2 x daily - 7 x weekly - 2 sets - 10 reps - 3 hold - Heel Raises with Counter Support  - 1-2 x daily - 7 x weekly - 2 sets - 10 reps 04/09/22 - Seated Calf Stretch with Strap  - 1-2 x daily - 7 x weekly - 3 reps - 30 hold Exercises - Seated Gaze Stabilization with Head Nod  - 3 x daily - 7 x weekly - 1 sets - 10-20 reps - Seated Gaze Stabilization with Head Rotation  - 3 x daily - 7 x weekly - 1 sets - 10-20 reps  GOALS: Goals reviewed with patient? No  SHORT TERM GOALS: Target date: 04/13/2022  patient will be independent with initial HEP  Baseline: Goal status: MET  2.  Patient will self report 30% improvement to improve tolerance for functional activity   Baseline:  Goal status: MET   LONG TERM GOALS: Target  date: 04/27/2022  Patient will be independent in self management strategies to improve quality of life and functional outcomes.  Baseline:  Goal status: IN PROGRESS  2.  Patient will self report 50% improvement to improve tolerance for functional activity   Baseline:  Goal status: IN PROGRESS  3.  Patient will improve score on DHI by 20 points to demostrate decreased dizziness with functional tasks (goal 40) Baseline: 60; 58 04/27/22  Goal status: IN PROGRESS  4.  Patient will remain free of falls Baseline: 2/27 no new falls Goal status: IN PROGRESS  5.  Patient will improve 5 times sit to stand score from 47.28  sec to 30 sec to demonstrate improved functional mobility and increased lower extremity strength.  Baseline: 04/27/22 30.09 sec using hands to assist up to standing Goal status: IN PROGRESS  6. (Goal added 04/27/22) patient will be able to stand SLS x 10" each leg to demonstrate improved functional balance and decreased fall risk.  Baseline: 4 sec each leg  Goal status: in progress   ASSESSMENT:  CLINICAL IMPRESSION Of noted right ankle dorsiflexion weakness > left.  Had a discussion with patient regarding his neuropathy and how that affects his balance.  Discussed that his neuropathy is unlikely to change so we have to find strategies for fall prevention. Continues with step ups being greatest challenge.  To date, skilled PT is required to address the impairments and improve function. .    Eval:Patient is a 87 y.o. male who was seen today for physical therapy evaluation and treatment for dizziness. Patient presents to physical therapy with complaint of light headed feelings with position changes, turning his head and difficulty with his balance. Patient demonstrates decreased strength, balance deficits and gait abnormalities which are negatively impacting patient ability to perform ADLs and functional mobility tasks.  Patient demonstrates increased vestibular symptoms with  VOR testing which is negatively impacting patient ability to perform ADLs and functional mobility tasks. Patient will benefit from skilled physical therapy services to address these deficits to improve level of function with ADLs, functional mobility tasks, and reduce risk for falls.     OBJECTIVE IMPAIRMENTS: Abnormal gait, decreased activity tolerance, decreased balance, decreased endurance, decreased mobility, difficulty walking, decreased ROM, decreased strength, dizziness, hypomobility, increased fascial restrictions, impaired perceived functional ability, impaired flexibility, and pain.   ACTIVITY LIMITATIONS: carrying, lifting, bending, standing, stairs, bathing, toileting, dressing, locomotion level, and caring for others  PARTICIPATION LIMITATIONS: meal prep, cleaning, laundry, driving, shopping, community activity, occupation, and yard work  PERSONAL FACTORS: 1 comorbidity: neuropathy  are also  affecting patient's functional outcome.   REHAB POTENTIAL: Good  CLINICAL DECISION MAKING: Stable/uncomplicated  EVALUATION COMPLEXITY: Moderate   PLAN:  PT FREQUENCY: 2x/week  PT DURATION: 4 weeks  PLANNED INTERVENTIONS: Therapeutic exercises, Therapeutic activity, Neuromuscular re-education, Balance training, Gait training, Patient/Family education, Joint manipulation, Joint mobilization, Stair training, Orthotic/Fit training, DME instructions, Aquatic Therapy, Dry Needling, Electrical stimulation, Spinal manipulation, Spinal mobilization, Cryotherapy, Moist heat, Compression bandaging, scar mobilization, Splintting, Taping, Traction, Ultrasound, Ionotophoresis 4mg /ml Dexamethasone, and Manual therapy   PLAN FOR NEXT SESSION: continue with focus on balance; gait training and lower extremity strengthening. May benefit from FES/NMES to strengthen the dorsiflexors.  9:48 AM, 05/14/22 Kendale Rembold Small Rashika Bettes MPT [facility]  [phone]

## 2022-05-17 DIAGNOSIS — Z299 Encounter for prophylactic measures, unspecified: Secondary | ICD-10-CM | POA: Diagnosis not present

## 2022-05-17 DIAGNOSIS — Z6824 Body mass index (BMI) 24.0-24.9, adult: Secondary | ICD-10-CM | POA: Diagnosis not present

## 2022-05-17 DIAGNOSIS — R42 Dizziness and giddiness: Secondary | ICD-10-CM | POA: Diagnosis not present

## 2022-05-17 DIAGNOSIS — I10 Essential (primary) hypertension: Secondary | ICD-10-CM | POA: Diagnosis not present

## 2022-05-18 ENCOUNTER — Ambulatory Visit (HOSPITAL_COMMUNITY): Payer: Medicare Other

## 2022-05-18 DIAGNOSIS — R2689 Other abnormalities of gait and mobility: Secondary | ICD-10-CM | POA: Diagnosis not present

## 2022-05-18 DIAGNOSIS — R42 Dizziness and giddiness: Secondary | ICD-10-CM

## 2022-05-18 DIAGNOSIS — R262 Difficulty in walking, not elsewhere classified: Secondary | ICD-10-CM

## 2022-05-18 NOTE — Therapy (Signed)
OUTPATIENT PHYSICAL THERAPY VESTIBULAR TREATMENT       Patient Name: Benjamin Yang MRN: [id number] DOB:1935/12/09, 87 y.o., male Today's Date: 05/18/2022  END OF SESSION:  PT End of Session - 05/18/22 0729     Visit Number 14    Number of Visits 16    Date for PT Re-Evaluation 05/25/22    Authorization Type Medicare    Progress Note Due on Visit 16    PT Start Time 0729    PT Stop Time 0810    PT Time Calculation (min) 41 min    Activity Tolerance Patient tolerated treatment well    Behavior During Therapy [facility] for tasks assessed/performed            Past Medical History:  Diagnosis Date   Cancer ([place])    bladder   Hypertension    Prostatitis    Past Surgical History:  Procedure Laterality Date   BACK SURGERY     CHOLECYSTECTOMY N/A 01/06/2021   Procedure: LAPAROSCOPIC CHOLECYSTECTOMY;  Surgeon: Aviva Signs, MD;  Location: [facility];  Service: General;  Laterality: N/A;   CYSTOSCOPY N/A 09/15/2012   Procedure: CYSTOSCOPY;  Surgeon: Alexis Frock, MD;  Location: [facility];  Service: Urology;  Laterality: N/A;   LYMPHADENECTOMY Bilateral 09/15/2012   Procedure:  Bilateral Pelvic Lymph Node Dissection;  Surgeon: Alexis Frock, MD;  Location: [facility];  Service: Urology;  Laterality: Bilateral;   ROBOT ASSISTED LAPAROSCOPIC COMPLETE CYSTECT ILEAL CONDUIT N/A 09/15/2012   Procedure: Robotic Cystoprostatectomy, Open Ileal Conduit Diversion,  Indocyanine Green Dye Injection into Bladder ;  Surgeon: Alexis Frock, MD;  Location: [facility];  Service: Urology;  Laterality: N/A;  Robotic Cystoprostatectomy, Bilateral Pelvic Lymph Node Dissection, Open Ileal Conduit Diversion, Cysto, Indocyanine Green Dye Injection into Bladder     ROBOTIC ASSISTED LAPAROSCOPIC LYSIS OF ADHESION N/A 09/15/2012   Procedure: ROBOTIC ASSISTED LAPAROSCOPIC LYSIS OF ADHESION;  Surgeon: Alexis Frock, MD;  Location: [facility];  Service: Urology;  Laterality: N/A;   Patient Active Problem List   Diagnosis  Date Noted   Anxiety 03/21/2022   Hyperglycemia 03/21/2022   Hydronephrosis with obstructing calculus 03/20/2022   Empyema of gallbladder    Acute cholecystitis 01/02/2021   Severe sepsis ([place])    Sepsis ([place]) 04/13/2018   Lobar pneumonia ([place]) 04/13/2018   UTI (urinary tract infection) 04/13/2018   Essential hypertension 04/13/2018    PCP: Dr. Manuella Ghazi REFERRING PROVIDER: Raylene Miyamoto, MD  REFERRING DIAG: dizziness  THERAPY DIAG:  Dizziness and giddiness  Difficulty in walking, not elsewhere classified  ONSET DATE: years; worsened lately  Rationale for Evaluation and Treatment: Rehabilitation  SUBJECTIVE:   SUBJECTIVE STATEMENT: Patient reports dizziness is gone; his legs are still weak and balance is off but he feels he is improving  PERTINENT HISTORY: gallbladder removed 2022; had home health  PAIN:  Are you having pain? No  PRECAUTIONS: Fall  WEIGHT BEARING RESTRICTIONS: No  FALLS: Has patient fallen in last 6 months? Yes. Number of falls 2  LIVING ENVIRONMENT: Lives with: lives with their spouse Lives in: House/apartment Stairs: Yes: External: 1 steps; none Has following equipment at home: Walker - 2 wheeled, shower chair, Grab bars, and walking stick  PLOF: Independent with household mobility with device  PATIENT GOALS: be less tentative; have better balance; be able to walk out in the yard; walk better  OBJECTIVE:   DIAGNOSTIC FINDINGS: none in EPIC; patient reports Manuella Ghazi did CT/MRI of head all clear  COGNITION: Overall cognitive status: Within  functional limits for tasks assessed   SENSATION: Neuropathy in both foot especially left foot  MUSCLE TONE:  wfl   POSTURE:  rounded shoulders and forward head  Cervical ROM:  all wfl  Active A/PROM (deg) eval  Flexion   Extension   Right lateral flexion   Left lateral flexion   Right rotation   Left rotation   (Blank rows = not tested)  STRENGTH: not tested today  LOWER EXTREMITY MMT:    MMT Right eval Left eval  Hip flexion    Hip abduction    Hip adduction    Hip internal rotation    Hip external rotation    Knee flexion    Knee extension    Ankle dorsiflexion    Ankle plantarflexion    Ankle inversion    Ankle eversion    (Blank rows = not tested)  BED MOBILITY:  Sit to supine Modified independence Supine to sit Modified independence  TRANSFERS: Assistive device utilized:  walking stick   Sit to stand: SBA Stand to sit: SBA Chair to chair: SBA  GAIT: Gait pattern: decreased arm swing- Right, decreased arm swing- Left, decreased step length- Right, and decreased step length- Left Distance walked: 80 ft Assistive device utilized:  walking stick Level of assistance: SBA Comments: slow gait speed; short step length  FUNCTIONAL TESTS:  5 times sit to stand: 47.28 using arms to assist up  PATIENT SURVEYS:  DHI 60; severe handicap  VESTIBULAR ASSESSMENT:  GENERAL OBSERVATION: no glassess   SYMPTOM BEHAVIOR:  Subjective history: see above  Non-Vestibular symptoms: changes in hearing  Type of dizziness: Imbalance (Disequilibrium) and Lightheadedness/Faint  Frequency: varies  Duration: varies  Aggravating factors: Induced by position change: supine to sit, Induced by motion: bending down to the ground and turning head quickly, Worse outside or in busy environment, and Moving eyes  Relieving factors: no known relieving factors  Progression of symptoms: unchanged  OCULOMOTOR EXAM:  Ocular Alignment: normal  Ocular ROM: No Limitations  Spontaneous Nystagmus: absent  Gaze-Induced Nystagmus: absent  Smooth Pursuits: intact  Saccades:   Convergence/Divergence: not tested  VESTIBULAR - OCULAR REFLEX:   Slow VOR: Positive Right  VOR Cancellation:   Head-Impulse Test: HIT Right: positive HIT Left: negative  Dynamic Visual Acuity:  not tested   POSITIONAL TESTING: Right Dix-Hallpike: no nystagmus Left Dix-Hallpike: no nystagmus  MOTION  SENSITIVITY:  Motion Sensitivity Quotient Intensity: 0 = none, 1 = Lightheaded, 2 = Mild, 3 = Moderate, 4 = Severe, 5 = Vomiting  Intensity  1. Sitting to supine 0  2. Supine to L side   3. Supine to R side   4. Supine to sitting 1  5. L Hallpike-Dix 0  6. Up from L  1  7. R Hallpike-Dix 0  8. Up from R  1  9. Sitting, head tipped to L knee 0  10. Head up from L knee 0  11. Sitting, head tipped to R knee 0  12. Head up from R knee 0  13. Sitting head turns x5 0  14.Sitting head nods x5 0  15. In stance, 180 turn to L    16. In stance, 180 turn to R     OTHOSTATICS: not done  FUNCTIONAL GAIT:    VESTIBULAR TREATMENT:  DATE:  05/18/22 Seated: Toe raises 2 x 10 each (individual) Hamstring/calf stretch with strap 3 x 10" each  Sit to stand with foam pad in seat without UE assist 2 x 10 Sit to stand from standard chair with no UE assist x 3  Standing: Step up on foam and over and back x 5 Standing with small BOS with head turns and nods x 10 each 4" box step ups 2 x 5 each leg no UE support Hip vectors with 2 UE and then 1 UE assist 2" hold 2 x 5 each     05/14/22 Seated: Toe raises 2 x 10 each (individual) Hamstring stretch with strap 10 x 10"  Sit to stand 2 x 5 using hands on knee to push up to stand; chair with cushion in seat  Standing: (in // bars) On blue foam pad head nods and turns x 10 each; CGA for safety 2" step ups no UE assist 2 x 5 each leg with CGA Cones Toe taps x 2 cones; tap each cone once 2 sets of 5  05/12/22 Seated: Toe raises x 20 Soleus slant board stretch x 30" x 3 Standing: // bars (CGA for all exercise for safety) Gastrocnemius slant board stretch x 30" x 3 Ankle PF, GTB x 10 x  2 each Heel raises x 20 with 1 hand UE assist Small BOS on foam with head turns and nods 2 x 30" each no UE assist Rhythmic stabilization, firm surface, eyes  open, ant/post x 3" x 10 x 2 on each side alternating Standing on blue foam, eyes closed, normal BOS x 30" x 2 Cone taps x 2; 10 times each leg with 1 fingertip UE assist Heel taps on a floor target x 10 x 2 on each, with 2 fingertip UE assist Hip vectors x 10 each leg  05/06/22 Seated: Toe raises x 20 Standing: // bars (CGA for all exercise for safety) Gastrocnemius slant board stretch x 30" x 3 Ankle PF, GTB x 10 x  2 each Heel raises x 20 with 1 hand UE assist Normal BOS on foam with while reaching in various directions x 10 x 2 on each side, no UE assist Rhythmic stabilization, firm surface, eyes open, ant/post x 3" x 10 x 2 on each side alternating Standing on blue foam, eyes closed, normal BOS x 30" x 2 Cone taps x 2; 10 times each leg with 1 fingertip UE assist Heel taps on a floor target x 10 x 2 on each, with 2 fingertip UE assist Hip vectors x 10 each leg  05/05/22 Seated: Toe raises x 20 Standing: // bars (CGA for all exercise for safety) Heel raises x 20 with 1 hand UE assist Small BOS on foam with head turns and nods 2 x 30" each no UE assist Rhythmic stabilization, firm surface, eyes open, ant/post x 3" x 10 x 2 on each side alternating Standing on blue foam, eyes closed, normal BOS x 30" x 2 Cone taps x 2; 10 times each leg with 2 fingertip UE assist Heel taps on a floor target x 10 x 2 on each, 1 UE assist Hip vectors 2 x 5 each leg  04/29/22 Standing: // bars (CGA for all exercise for safety) Heel raises x 20 with 1 hand UE assist 4" box step ups 2 x 5 each no UE assist 4" lateral steps x 5 each no UE assist Small BOS on foam with head turns and nods 2 x  30" each no UE assist SLS on blue foam; 3 x 3" on right; 3 x 10" on left no UE assist Cone taps x 2; 5 times each leg with 2 fingertip UE assist Hip vectors 2 x 5 each leg   04/27/22 Progress note DHI 58 (54 + severe handicap) 5 x sit to stand 30.09 sec using hands to push up SLS 4" each Standing: 4" step  ups x 5 each foot no UE assist Hip vectors with 1 UE assist x 5 each   04/23/22 Standing: // heel raises x 10; 1 UE assist SLS 3 x 10" each with 1 UE assist Tandem stance no UE assist x 30" each; CGA for safety Small BOS on black foam with head turns no UE assist 2 x 1' Sit to stand 2 x 5 hands on thighs to assist to pushing up 4" step ups x 5 each no UE assist  Seated: Hamstring stretch 10 x 10"     Canalith Repositioning:   Gaze Adaptation:  x1 Viewing Horizontal: Position: sitting and Reps: 10 and x1 Viewing Vertical:  Position: sitting and Reps: 10 Habituation:  Seated Vertical Head Movements: number of reps: 10 and Seated Horizontal Head Movements: number of reps: 10 Other: issued for HEP  PATIENT EDUCATION: Education details: 04/16/22: updated HEP  Person educated: Patient Education method: Explanation, Demonstration, and Handouts Education comprehension: verbalized understanding, returned demonstration, verbal cues required, and tactile cues required   HOME EXERCISE PROGRAM: 05/18/22 hip vectors Access Code: [email or web address] URL: [email or web address] Date: 03/30/2022 Prepared by: [facility]  05/06/22 - Seated Ankle Plantar Flexion with Resistance Loop  - 2 x daily - 7 x weekly - 2 sets - 10 reps 05/05/22 - Seated Toe Raise  - 3 x daily - 7 x weekly - 2 sets - 10 reps 04/16/22 - Mini Squat with Counter Support  - 1-2 x daily - 7 x weekly - 2 sets - 10 reps - 3 hold - Heel Raises with Counter Support  - 1-2 x daily - 7 x weekly - 2 sets - 10 reps 04/09/22 - Seated Calf Stretch with Strap  - 1-2 x daily - 7 x weekly - 3 reps - 30 hold Exercises - Seated Gaze Stabilization with Head Nod  - 3 x daily - 7 x weekly - 1 sets - 10-20 reps - Seated Gaze Stabilization with Head Rotation  - 3 x daily - 7 x weekly - 1 sets - 10-20 reps  GOALS: Goals reviewed with patient? No  SHORT TERM GOALS: Target date: 04/13/2022  patient will be independent with initial  HEP  Baseline: Goal status: MET  2.  Patient will self report 30% improvement to improve tolerance for functional activity   Baseline:  Goal status: MET   LONG TERM GOALS: Target date: 04/27/2022  Patient will be independent in self management strategies to improve quality of life and functional outcomes.  Baseline:  Goal status: IN PROGRESS  2.  Patient will self report 50% improvement to improve tolerance for functional activity   Baseline:  Goal status: IN PROGRESS  3.  Patient will improve score on DHI by 20 points to demostrate decreased dizziness with functional tasks (goal 40) Baseline: 60; 58 04/27/22  Goal status: IN PROGRESS  4.  Patient will remain free of falls Baseline: 2/27 no new falls Goal status: IN PROGRESS  5.  Patient will improve 5 times sit to stand score from 47.28  sec to 30 sec to demonstrate  improved functional mobility and increased lower extremity strength.  Baseline: 04/27/22 30.09 sec using hands to assist up to standing Goal status: IN PROGRESS  6. (Goal added 04/27/22) patient will be able to stand SLS x 10" each leg to demonstrate improved functional balance and decreased fall risk.  Baseline: 4 sec each leg  Goal status: in progress   ASSESSMENT:  CLINICAL IMPRESSION Today's session continued with addressing lower extremity strengthening and balance. Increased effort for sit to stand but able to perform increased reps today.  Increased box height with step ups with good challenge.  Patient needs cues with hip vectors for maintaining good posture to avoid trunk substitution.  Good challenge for balance with supporting with 1 hand only with hip vectors.   Continues with weak dorsiflexion right > left. Able to stand today from standard chair without UE assist today; good improvement in ability to perform sit to stand.  Added hip vectors to HEP.   To date, skilled PT is required to address the impairments and improve function. .     Eval:Patient is a 87 y.o. male who was seen today for physical therapy evaluation and treatment for dizziness. Patient presents to physical therapy with complaint of light headed feelings with position changes, turning his head and difficulty with his balance. Patient demonstrates decreased strength, balance deficits and gait abnormalities which are negatively impacting patient ability to perform ADLs and functional mobility tasks.  Patient demonstrates increased vestibular symptoms with VOR testing which is negatively impacting patient ability to perform ADLs and functional mobility tasks. Patient will benefit from skilled physical therapy services to address these deficits to improve level of function with ADLs, functional mobility tasks, and reduce risk for falls.     OBJECTIVE IMPAIRMENTS: Abnormal gait, decreased activity tolerance, decreased balance, decreased endurance, decreased mobility, difficulty walking, decreased ROM, decreased strength, dizziness, hypomobility, increased fascial restrictions, impaired perceived functional ability, impaired flexibility, and pain.   ACTIVITY LIMITATIONS: carrying, lifting, bending, standing, stairs, bathing, toileting, dressing, locomotion level, and caring for others  PARTICIPATION LIMITATIONS: meal prep, cleaning, laundry, driving, shopping, community activity, occupation, and yard work  PERSONAL FACTORS: 1 comorbidity: neuropathy  are also affecting patient's functional outcome.   REHAB POTENTIAL: Good  CLINICAL DECISION MAKING: Stable/uncomplicated  EVALUATION COMPLEXITY: Moderate   PLAN:  PT FREQUENCY: 2x/week  PT DURATION: 4 weeks  PLANNED INTERVENTIONS: Therapeutic exercises, Therapeutic activity, Neuromuscular re-education, Balance training, Gait training, Patient/Family education, Joint manipulation, Joint mobilization, Stair training, Orthotic/Fit training, DME instructions, Aquatic Therapy, Dry Needling, Electrical stimulation,  Spinal manipulation, Spinal mobilization, Cryotherapy, Moist heat, Compression bandaging, scar mobilization, Splintting, Taping, Traction, Ultrasound, Ionotophoresis 4mg /ml Dexamethasone, and Manual therapy   PLAN FOR NEXT SESSION: continue with focus on balance; gait training and lower extremity strengthening. May benefit from FES/NMES to strengthen the dorsiflexors.  8:10 AM, 05/18/22 Towana Stenglein Small Philander Ake MPT [facility] [place] [phone]

## 2022-05-20 ENCOUNTER — Ambulatory Visit (HOSPITAL_COMMUNITY): Payer: Medicare Other

## 2022-05-20 DIAGNOSIS — R262 Difficulty in walking, not elsewhere classified: Secondary | ICD-10-CM

## 2022-05-20 DIAGNOSIS — R2689 Other abnormalities of gait and mobility: Secondary | ICD-10-CM | POA: Diagnosis not present

## 2022-05-20 DIAGNOSIS — R42 Dizziness and giddiness: Secondary | ICD-10-CM | POA: Diagnosis not present

## 2022-05-20 NOTE — Therapy (Signed)
OUTPATIENT PHYSICAL THERAPY VESTIBULAR TREATMENT       Patient Name: Benjamin Yang MRN: [id number] DOB:01-Dec-1935, 87 y.o., male Today's Date: 05/20/2022  END OF SESSION:  PT End of Session - 05/20/22 0817     Visit Number 15    Number of Visits 16    Date for PT Re-Evaluation 05/25/22    Authorization Type Medicare    Progress Note Due on Visit 16    PT Start Time 0817    PT Stop Time 0857    PT Time Calculation (min) 40 min    Activity Tolerance Patient tolerated treatment well    Behavior During Therapy [facility] for tasks assessed/performed            Past Medical History:  Diagnosis Date   Cancer ([place])    bladder   Hypertension    Prostatitis    Past Surgical History:  Procedure Laterality Date   BACK SURGERY     CHOLECYSTECTOMY N/A 01/06/2021   Procedure: LAPAROSCOPIC CHOLECYSTECTOMY;  Surgeon: Aviva Signs, MD;  Location: [facility];  Service: General;  Laterality: N/A;   CYSTOSCOPY N/A 09/15/2012   Procedure: CYSTOSCOPY;  Surgeon: Alexis Frock, MD;  Location: [facility];  Service: Urology;  Laterality: N/A;   LYMPHADENECTOMY Bilateral 09/15/2012   Procedure:  Bilateral Pelvic Lymph Node Dissection;  Surgeon: Alexis Frock, MD;  Location: [facility];  Service: Urology;  Laterality: Bilateral;   ROBOT ASSISTED LAPAROSCOPIC COMPLETE CYSTECT ILEAL CONDUIT N/A 09/15/2012   Procedure: Robotic Cystoprostatectomy, Open Ileal Conduit Diversion,  Indocyanine Green Dye Injection into Bladder ;  Surgeon: Alexis Frock, MD;  Location: [facility];  Service: Urology;  Laterality: N/A;  Robotic Cystoprostatectomy, Bilateral Pelvic Lymph Node Dissection, Open Ileal Conduit Diversion, Cysto, Indocyanine Green Dye Injection into Bladder     ROBOTIC ASSISTED LAPAROSCOPIC LYSIS OF ADHESION N/A 09/15/2012   Procedure: ROBOTIC ASSISTED LAPAROSCOPIC LYSIS OF ADHESION;  Surgeon: Alexis Frock, MD;  Location: [facility];  Service: Urology;  Laterality: N/A;   Patient Active Problem List   Diagnosis  Date Noted   Anxiety 03/21/2022   Hyperglycemia 03/21/2022   Hydronephrosis with obstructing calculus 03/20/2022   Empyema of gallbladder    Acute cholecystitis 01/02/2021   Severe sepsis ([place])    Sepsis ([place]) 04/13/2018   Lobar pneumonia ([place]) 04/13/2018   UTI (urinary tract infection) 04/13/2018   Essential hypertension 04/13/2018    PCP: Dr. Manuella Ghazi REFERRING PROVIDER: Raylene Miyamoto, MD  REFERRING DIAG: dizziness  THERAPY DIAG:  Dizziness and giddiness  Difficulty in walking, not elsewhere classified  Abnormality of gait due to impairment of balance  ONSET DATE: years; worsened lately  Rationale for Evaluation and Treatment: Rehabilitation  SUBJECTIVE:   SUBJECTIVE STATEMENT: Got up too fast yesterday; got a little light headed but otherwise no new issues or falls; able to walk outside yesterday about 20 minutes    PERTINENT HISTORY: gallbladder removed 2022; had home health  PAIN:  Are you having pain? No  PRECAUTIONS: Fall  WEIGHT BEARING RESTRICTIONS: No  FALLS: Has patient fallen in last 6 months? Yes. Number of falls 2  LIVING ENVIRONMENT: Lives with: lives with their spouse Lives in: House/apartment Stairs: Yes: External: 1 steps; none Has following equipment at home: Walker - 2 wheeled, shower chair, Grab bars, and walking stick  PLOF: Independent with household mobility with device  PATIENT GOALS: be less tentative; have better balance; be able to walk out in the yard; walk better  OBJECTIVE:   DIAGNOSTIC FINDINGS: none in  EPIC; patient reports Manuella Ghazi did CT/MRI of head all clear  COGNITION: Overall cognitive status: Within functional limits for tasks assessed   SENSATION: Neuropathy in both foot especially left foot  MUSCLE TONE:  wfl   POSTURE:  rounded shoulders and forward head  Cervical ROM:  all wfl  Active A/PROM (deg) eval  Flexion   Extension   Right lateral flexion   Left lateral flexion   Right rotation   Left rotation    (Blank rows = not tested)  STRENGTH: not tested today  LOWER EXTREMITY MMT:   MMT Right eval Left eval  Hip flexion    Hip abduction    Hip adduction    Hip internal rotation    Hip external rotation    Knee flexion    Knee extension    Ankle dorsiflexion    Ankle plantarflexion    Ankle inversion    Ankle eversion    (Blank rows = not tested)  BED MOBILITY:  Sit to supine Modified independence Supine to sit Modified independence  TRANSFERS: Assistive device utilized:  walking stick   Sit to stand: SBA Stand to sit: SBA Chair to chair: SBA  GAIT: Gait pattern: decreased arm swing- Right, decreased arm swing- Left, decreased step length- Right, and decreased step length- Left Distance walked: 80 ft Assistive device utilized:  walking stick Level of assistance: SBA Comments: slow gait speed; short step length  FUNCTIONAL TESTS:  5 times sit to stand: 47.28 using arms to assist up  PATIENT SURVEYS:  DHI 60; severe handicap  VESTIBULAR ASSESSMENT:  GENERAL OBSERVATION: no glassess   SYMPTOM BEHAVIOR:  Subjective history: see above  Non-Vestibular symptoms: changes in hearing  Type of dizziness: Imbalance (Disequilibrium) and Lightheadedness/Faint  Frequency: varies  Duration: varies  Aggravating factors: Induced by position change: supine to sit, Induced by motion: bending down to the ground and turning head quickly, Worse outside or in busy environment, and Moving eyes  Relieving factors: no known relieving factors  Progression of symptoms: unchanged  OCULOMOTOR EXAM:  Ocular Alignment: normal  Ocular ROM: No Limitations  Spontaneous Nystagmus: absent  Gaze-Induced Nystagmus: absent  Smooth Pursuits: intact  Saccades:   Convergence/Divergence: not tested  VESTIBULAR - OCULAR REFLEX:   Slow VOR: Positive Right  VOR Cancellation:   Head-Impulse Test: HIT Right: positive HIT Left: negative  Dynamic Visual Acuity:  not tested   POSITIONAL  TESTING: Right Dix-Hallpike: no nystagmus Left Dix-Hallpike: no nystagmus  MOTION SENSITIVITY:  Motion Sensitivity Quotient Intensity: 0 = none, 1 = Lightheaded, 2 = Mild, 3 = Moderate, 4 = Severe, 5 = Vomiting  Intensity  1. Sitting to supine 0  2. Supine to L side   3. Supine to R side   4. Supine to sitting 1  5. L Hallpike-Dix 0  6. Up from L  1  7. R Hallpike-Dix 0  8. Up from R  1  9. Sitting, head tipped to L knee 0  10. Head up from L knee 0  11. Sitting, head tipped to R knee 0  12. Head up from R knee 0  13. Sitting head turns x5 0  14.Sitting head nods x5 0  15. In stance, 180 turn to L    16. In stance, 180 turn to R     OTHOSTATICS: not done  FUNCTIONAL GAIT:    VESTIBULAR TREATMENT:  DATE:  05/20/22 Seated: Toe raises x 10; PT added assist to increase range of motion Hamstring/calf stretch 10 x 10" 3# LAQs 2 x 10  Standing: // bars Slant board 5 x 15" SLS x 10" each with CGA Hip vectors 2" hold 2 x 5 Tandem stance on foam beam 2 x 30" Heel taps to target x 2 with 1 UE assist x 5 each  Sit to stand no UE assist 2 x 5  5 times sit to stand 19.83 sec no UE assist (goal met)   05/18/22 Seated: Toe raises 2 x 10 each (individual) Hamstring/calf stretch with strap 3 x 10" each  Sit to stand with foam pad in seat without UE assist 2 x 10 Sit to stand from standard chair with no UE assist x 3  Standing: Step up on foam and over and back x 5 Standing with small BOS with head turns and nods x 10 each 4" box step ups 2 x 5 each leg no UE support Hip vectors with 2 UE and then 1 UE assist 2" hold 2 x 5 each     05/14/22 Seated: Toe raises 2 x 10 each (individual) Hamstring stretch with strap 10 x 10"  Sit to stand 2 x 5 using hands on knee to push up to stand; chair with cushion in seat  Standing: (in // bars) On blue foam pad head nods and turns  x 10 each; CGA for safety 2" step ups no UE assist 2 x 5 each leg with CGA Cones Toe taps x 2 cones; tap each cone once 2 sets of 5  05/12/22 Seated: Toe raises x 20 Soleus slant board stretch x 30" x 3 Standing: // bars (CGA for all exercise for safety) Gastrocnemius slant board stretch x 30" x 3 Ankle PF, GTB x 10 x  2 each Heel raises x 20 with 1 hand UE assist Small BOS on foam with head turns and nods 2 x 30" each no UE assist Rhythmic stabilization, firm surface, eyes open, ant/post x 3" x 10 x 2 on each side alternating Standing on blue foam, eyes closed, normal BOS x 30" x 2 Cone taps x 2; 10 times each leg with 1 fingertip UE assist Heel taps on a floor target x 10 x 2 on each, with 2 fingertip UE assist Hip vectors x 10 each leg  05/06/22 Seated: Toe raises x 20 Standing: // bars (CGA for all exercise for safety) Gastrocnemius slant board stretch x 30" x 3 Ankle PF, GTB x 10 x  2 each Heel raises x 20 with 1 hand UE assist Normal BOS on foam with while reaching in various directions x 10 x 2 on each side, no UE assist Rhythmic stabilization, firm surface, eyes open, ant/post x 3" x 10 x 2 on each side alternating Standing on blue foam, eyes closed, normal BOS x 30" x 2 Cone taps x 2; 10 times each leg with 1 fingertip UE assist Heel taps on a floor target x 10 x 2 on each, with 2 fingertip UE assist Hip vectors x 10 each leg  05/05/22 Seated: Toe raises x 20 Standing: // bars (CGA for all exercise for safety) Heel raises x 20 with 1 hand UE assist Small BOS on foam with head turns and nods 2 x 30" each no UE assist Rhythmic stabilization, firm surface, eyes open, ant/post x 3" x 10 x 2 on each side alternating Standing on blue foam, eyes closed,  normal BOS x 30" x 2 Cone taps x 2; 10 times each leg with 2 fingertip UE assist Heel taps on a floor target x 10 x 2 on each, 1 UE assist Hip vectors 2 x 5 each leg        Canalith Repositioning:   Gaze  Adaptation:  x1 Viewing Horizontal: Position: sitting and Reps: 10 and x1 Viewing Vertical:  Position: sitting and Reps: 10 Habituation:  Seated Vertical Head Movements: number of reps: 10 and Seated Horizontal Head Movements: number of reps: 10 Other: issued for HEP  PATIENT EDUCATION: Education details: 04/16/22: updated HEP  Person educated: Patient Education method: Explanation, Demonstration, and Handouts Education comprehension: verbalized understanding, returned demonstration, verbal cues required, and tactile cues required   HOME EXERCISE PROGRAM: 05/18/22 hip vectors Access Code: [email or web address] URL: [email or web address] Date: 03/30/2022 Prepared by: [facility]  05/06/22 - Seated Ankle Plantar Flexion with Resistance Loop  - 2 x daily - 7 x weekly - 2 sets - 10 reps 05/05/22 - Seated Toe Raise  - 3 x daily - 7 x weekly - 2 sets - 10 reps 04/16/22 - Mini Squat with Counter Support  - 1-2 x daily - 7 x weekly - 2 sets - 10 reps - 3 hold - Heel Raises with Counter Support  - 1-2 x daily - 7 x weekly - 2 sets - 10 reps 04/09/22 - Seated Calf Stretch with Strap  - 1-2 x daily - 7 x weekly - 3 reps - 30 hold Exercises - Seated Gaze Stabilization with Head Nod  - 3 x daily - 7 x weekly - 1 sets - 10-20 reps - Seated Gaze Stabilization with Head Rotation  - 3 x daily - 7 x weekly - 1 sets - 10-20 reps  GOALS: Goals reviewed with patient? No  SHORT TERM GOALS: Target date: 04/13/2022  patient will be independent with initial HEP  Baseline: Goal status: MET  2.  Patient will self report 30% improvement to improve tolerance for functional activity   Baseline:  Goal status: MET   LONG TERM GOALS: Target date: 04/27/2022  Patient will be independent in self management strategies to improve quality of life and functional outcomes.  Baseline:  Goal status: IN PROGRESS  2.  Patient will self report 50% improvement to improve tolerance for functional  activity   Baseline:  Goal status: IN PROGRESS  3.  Patient will improve score on DHI by 20 points to demostrate decreased dizziness with functional tasks (goal 40) Baseline: 60; 58 04/27/22  Goal status: IN PROGRESS  4.  Patient will remain free of falls Baseline: 2/27 no new falls Goal status: IN PROGRESS  5.  Patient will improve 5 times sit to stand score from 47.28  sec to 30 sec to demonstrate improved functional mobility and increased lower extremity strength.  Baseline: 04/27/22 30.09 sec using hands to assist up to standing; 05/20/22 19.83 without UE assist Goal status: MET  6. (Goal added 04/27/22) patient will be able to stand SLS x 10" each leg to demonstrate improved functional balance and decreased fall risk.  Baseline: 4 sec each leg  Goal status: in progress   ASSESSMENT:  CLINICAL IMPRESSION Today's session continued with addressing lower extremity strengthening and balance. Continues with weakness with bilateral ankle dorsiflexors.  Noted good improvement with sit to stand and met goal today for sit to stand test without using Ue's to assist.  Max challenge with SLS; more difficulty with left leg than  right balancing on single leg and knee tends to want to buckle bilaterally with SLS.  Added LAQs for quad strengthening.  Also max challenge with tandem stance on foam; needs CGA and frequent use of hands to avoid loss of balance.  To date, skilled PT is required to address the impairments and improve function. .    Eval:Patient is a 87 y.o. male who was seen today for physical therapy evaluation and treatment for dizziness. Patient presents to physical therapy with complaint of light headed feelings with position changes, turning his head and difficulty with his balance. Patient demonstrates decreased strength, balance deficits and gait abnormalities which are negatively impacting patient ability to perform ADLs and functional mobility tasks.  Patient demonstrates increased  vestibular symptoms with VOR testing which is negatively impacting patient ability to perform ADLs and functional mobility tasks. Patient will benefit from skilled physical therapy services to address these deficits to improve level of function with ADLs, functional mobility tasks, and reduce risk for falls.     OBJECTIVE IMPAIRMENTS: Abnormal gait, decreased activity tolerance, decreased balance, decreased endurance, decreased mobility, difficulty walking, decreased ROM, decreased strength, dizziness, hypomobility, increased fascial restrictions, impaired perceived functional ability, impaired flexibility, and pain.   ACTIVITY LIMITATIONS: carrying, lifting, bending, standing, stairs, bathing, toileting, dressing, locomotion level, and caring for others  PARTICIPATION LIMITATIONS: meal prep, cleaning, laundry, driving, shopping, community activity, occupation, and yard work  PERSONAL FACTORS: 1 comorbidity: neuropathy  are also affecting patient's functional outcome.   REHAB POTENTIAL: Good  CLINICAL DECISION MAKING: Stable/uncomplicated  EVALUATION COMPLEXITY: Moderate   PLAN:  PT FREQUENCY: 2x/week  PT DURATION: 4 weeks  PLANNED INTERVENTIONS: Therapeutic exercises, Therapeutic activity, Neuromuscular re-education, Balance training, Gait training, Patient/Family education, Joint manipulation, Joint mobilization, Stair training, Orthotic/Fit training, DME instructions, Aquatic Therapy, Dry Needling, Electrical stimulation, Spinal manipulation, Spinal mobilization, Cryotherapy, Moist heat, Compression bandaging, scar mobilization, Splintting, Taping, Traction, Ultrasound, Ionotophoresis 4mg /ml Dexamethasone, and Manual therapy   PLAN FOR NEXT SESSION: continue with focus on balance; gait training and lower extremity strengthening. May benefit from FES/NMES to strengthen the dorsiflexors.  8:18 AM, 05/20/22 Tomasita Beevers [place] MPT [place] [facility]  [phone]

## 2022-05-25 ENCOUNTER — Ambulatory Visit (HOSPITAL_COMMUNITY): Payer: Medicare Other

## 2022-05-25 DIAGNOSIS — R2689 Other abnormalities of gait and mobility: Secondary | ICD-10-CM

## 2022-05-25 DIAGNOSIS — R262 Difficulty in walking, not elsewhere classified: Secondary | ICD-10-CM

## 2022-05-25 DIAGNOSIS — R42 Dizziness and giddiness: Secondary | ICD-10-CM

## 2022-05-25 NOTE — Therapy (Signed)
OUTPATIENT PHYSICAL THERAPY VESTIBULAR TREATMENT/PROGRESS NOTE/DISCHARGE NOTE PHYSICAL THERAPY DISCHARGE SUMMARY  Visits from Start of Care: 16  Current functional level related to goals / functional outcomes: See below   Remaining deficits: See below   Education / Equipment: See below   Patient agrees to discharge. Patient goals were partially met. Patient is being discharged due to maximized rehab potential.   Progress Note Reporting Period 03/30/2022 to 05/25/2022  See note below for Objective Data and Assessment of Progress/Goals.           Patient Name: Benjamin Yang MRN: [id number] DOB:1935/06/17, 87 y.o., male Today's Date: 05/25/2022  END OF SESSION:  PT End of Session - 05/25/22 0824     Visit Number 16    Number of Visits 16    Date for PT Re-Evaluation 05/25/22    Authorization Type Medicare    Progress Note Due on Visit 16    PT Start Time 0817    PT Stop Time 0858    PT Time Calculation (min) 41 min    Activity Tolerance Patient tolerated treatment well    Behavior During Therapy [facility] for tasks assessed/performed            Past Medical History:  Diagnosis Date   Cancer ([place])    bladder   Hypertension    Prostatitis    Past Surgical History:  Procedure Laterality Date   BACK SURGERY     CHOLECYSTECTOMY N/A 01/06/2021   Procedure: LAPAROSCOPIC CHOLECYSTECTOMY;  Surgeon: Aviva Signs, MD;  Location: [facility];  Service: General;  Laterality: N/A;   CYSTOSCOPY N/A 09/15/2012   Procedure: CYSTOSCOPY;  Surgeon: Alexis Frock, MD;  Location: [facility];  Service: Urology;  Laterality: N/A;   LYMPHADENECTOMY Bilateral 09/15/2012   Procedure:  Bilateral Pelvic Lymph Node Dissection;  Surgeon: Alexis Frock, MD;  Location: [facility];  Service: Urology;  Laterality: Bilateral;   ROBOT ASSISTED LAPAROSCOPIC COMPLETE CYSTECT ILEAL CONDUIT N/A 09/15/2012   Procedure: Robotic Cystoprostatectomy, Open Ileal Conduit Diversion,  Indocyanine Green Dye Injection  into Bladder ;  Surgeon: Alexis Frock, MD;  Location: [facility];  Service: Urology;  Laterality: N/A;  Robotic Cystoprostatectomy, Bilateral Pelvic Lymph Node Dissection, Open Ileal Conduit Diversion, Cysto, Indocyanine Green Dye Injection into Bladder     ROBOTIC ASSISTED LAPAROSCOPIC LYSIS OF ADHESION N/A 09/15/2012   Procedure: ROBOTIC ASSISTED LAPAROSCOPIC LYSIS OF ADHESION;  Surgeon: Alexis Frock, MD;  Location: [facility];  Service: Urology;  Laterality: N/A;   Patient Active Problem List   Diagnosis Date Noted   Anxiety 03/21/2022   Hyperglycemia 03/21/2022   Hydronephrosis with obstructing calculus 03/20/2022   Empyema of gallbladder    Acute cholecystitis 01/02/2021   Severe sepsis ([place])    Sepsis ([place]) 04/13/2018   Lobar pneumonia ([place]) 04/13/2018   UTI (urinary tract infection) 04/13/2018   Essential hypertension 04/13/2018    PCP: Dr. Manuella Ghazi REFERRING PROVIDER: Raylene Miyamoto, MD  REFERRING DIAG: dizziness  THERAPY DIAG:  Dizziness and giddiness  Difficulty in walking, not elsewhere classified  Abnormality of gait due to impairment of balance  ONSET DATE: years; worsened lately  Rationale for Evaluation and Treatment: Rehabilitation  SUBJECTIVE:   SUBJECTIVE STATEMENT: No new falls; dizziness is much better; balance maybe "20%" better; I am a little stronger in my legs  PERTINENT HISTORY: gallbladder removed 2022; had home health  PAIN:  Are you having pain? No  PRECAUTIONS: Fall  WEIGHT BEARING RESTRICTIONS: No  FALLS: Has patient fallen in last 6 months?  Yes. Number of falls 2  LIVING ENVIRONMENT: Lives with: lives with their spouse Lives in: House/apartment Stairs: Yes: External: 1 steps; none Has following equipment at home: Walker - 2 wheeled, shower chair, Grab bars, and walking stick  PLOF: Independent with household mobility with device  PATIENT GOALS: be less tentative; have better balance; be able to walk out in the yard; walk  better  OBJECTIVE:   DIAGNOSTIC FINDINGS: none in EPIC; patient reports Manuella Ghazi did CT/MRI of head all clear  COGNITION: Overall cognitive status: Within functional limits for tasks assessed   SENSATION: Neuropathy in both foot especially left foot  MUSCLE TONE:  wfl   POSTURE:  rounded shoulders and forward head  Cervical ROM:  all wfl  Active A/PROM (deg) eval  Flexion   Extension   Right lateral flexion   Left lateral flexion   Right rotation   Left rotation   (Blank rows = not tested)  STRENGTH: not tested today  LOWER EXTREMITY MMT:   MMT Right eval Left eval  Hip flexion    Hip abduction    Hip adduction    Hip internal rotation    Hip external rotation    Knee flexion    Knee extension    Ankle dorsiflexion    Ankle plantarflexion    Ankle inversion    Ankle eversion    (Blank rows = not tested)  BED MOBILITY:  Sit to supine Modified independence Supine to sit Modified independence  TRANSFERS: Assistive device utilized:  walking stick   Sit to stand: SBA Stand to sit: SBA Chair to chair: SBA  GAIT: Gait pattern: decreased arm swing- Right, decreased arm swing- Left, decreased step length- Right, and decreased step length- Left Distance walked: 80 ft Assistive device utilized:  walking stick Level of assistance: SBA Comments: slow gait speed; short step length  FUNCTIONAL TESTS:  5 times sit to stand: 47.28 using arms to assist up  PATIENT SURVEYS:  DHI 60; severe handicap  VESTIBULAR ASSESSMENT:  GENERAL OBSERVATION: no glassess   SYMPTOM BEHAVIOR:  Subjective history: see above  Non-Vestibular symptoms: changes in hearing  Type of dizziness: Imbalance (Disequilibrium) and Lightheadedness/Faint  Frequency: varies  Duration: varies  Aggravating factors: Induced by position change: supine to sit, Induced by motion: bending down to the ground and turning head quickly, Worse outside or in busy environment, and Moving eyes  Relieving  factors: no known relieving factors  Progression of symptoms: unchanged  OCULOMOTOR EXAM:  Ocular Alignment: normal  Ocular ROM: No Limitations  Spontaneous Nystagmus: absent  Gaze-Induced Nystagmus: absent  Smooth Pursuits: intact  Saccades:   Convergence/Divergence: not tested  VESTIBULAR - OCULAR REFLEX:   Slow VOR: Positive Right  VOR Cancellation:   Head-Impulse Test: HIT Right: positive HIT Left: negative  Dynamic Visual Acuity:  not tested   POSITIONAL TESTING: Right Dix-Hallpike: no nystagmus Left Dix-Hallpike: no nystagmus  MOTION SENSITIVITY:  Motion Sensitivity Quotient Intensity: 0 = none, 1 = Lightheaded, 2 = Mild, 3 = Moderate, 4 = Severe, 5 = Vomiting  Intensity  1. Sitting to supine 0  2. Supine to L side   3. Supine to R side   4. Supine to sitting 1  5. L Hallpike-Dix 0  6. Up from L  1  7. R Hallpike-Dix 0  8. Up from R  1  9. Sitting, head tipped to L knee 0  10. Head up from L knee 0  11. Sitting, head tipped to R knee 0  12. Head up from R knee 0  13. Sitting head turns x5 0  14.Sitting head nods x5 0  15. In stance, 180 turn to L    16. In stance, 180 turn to R     OTHOSTATICS: not done  FUNCTIONAL GAIT:    VESTIBULAR TREATMENT:                                                                                                   DATE:  05/25/22 Progress note DHI 44 (improved from 60 at eval) 5 times sit to stand 20.03 sec no UE assist SLS left 11" sec and right 10"  Seated  Calf dorsiflexion stretch 5 x 20" each       05/20/22 Seated: Toe raises x 10; PT added assist to increase range of motion Hamstring/calf stretch 10 x 10" 3# LAQs 2 x 10  Standing: // bars Slant board 5 x 15" SLS x 10" each with CGA Hip vectors 2" hold 2 x 5 Tandem stance on foam beam 2 x 30" Heel taps to target x 2 with 1 UE assist x 5 each  Sit to stand no UE assist 2 x 5  5 times sit to stand 19.83 sec no UE assist (goal  met)   05/18/22 Seated: Toe raises 2 x 10 each (individual) Hamstring/calf stretch with strap 3 x 10" each  Sit to stand with foam pad in seat without UE assist 2 x 10 Sit to stand from standard chair with no UE assist x 3  Standing: Step up on foam and over and back x 5 Standing with small BOS with head turns and nods x 10 each 4" box step ups 2 x 5 each leg no UE support Hip vectors with 2 UE and then 1 UE assist 2" hold 2 x 5 each     05/14/22 Seated: Toe raises 2 x 10 each (individual) Hamstring stretch with strap 10 x 10"  Sit to stand 2 x 5 using hands on knee to push up to stand; chair with cushion in seat  Standing: (in // bars) On blue foam pad head nods and turns x 10 each; CGA for safety 2" step ups no UE assist 2 x 5 each leg with CGA Cones Toe taps x 2 cones; tap each cone once 2 sets of 5  05/12/22 Seated: Toe raises x 20 Soleus slant board stretch x 30" x 3 Standing: // bars (CGA for all exercise for safety) Gastrocnemius slant board stretch x 30" x 3 Ankle PF, GTB x 10 x  2 each Heel raises x 20 with 1 hand UE assist Small BOS on foam with head turns and nods 2 x 30" each no UE assist Rhythmic stabilization, firm surface, eyes open, ant/post x 3" x 10 x 2 on each side alternating Standing on blue foam, eyes closed, normal BOS x 30" x 2 Cone taps x 2; 10 times each leg with 1 fingertip UE assist Heel taps on a floor target x 10 x 2 on each, with 2 fingertip UE assist Hip vectors x  10 each leg  05/06/22 Seated: Toe raises x 20 Standing: // bars (CGA for all exercise for safety) Gastrocnemius slant board stretch x 30" x 3 Ankle PF, GTB x 10 x  2 each Heel raises x 20 with 1 hand UE assist Normal BOS on foam with while reaching in various directions x 10 x 2 on each side, no UE assist Rhythmic stabilization, firm surface, eyes open, ant/post x 3" x 10 x 2 on each side alternating Standing on blue foam, eyes closed, normal BOS x 30" x 2 Cone taps x 2; 10  times each leg with 1 fingertip UE assist Heel taps on a floor target x 10 x 2 on each, with 2 fingertip UE assist Hip vectors x 10 each leg  05/05/22 Seated: Toe raises x 20 Standing: // bars (CGA for all exercise for safety) Heel raises x 20 with 1 hand UE assist Small BOS on foam with head turns and nods 2 x 30" each no UE assist Rhythmic stabilization, firm surface, eyes open, ant/post x 3" x 10 x 2 on each side alternating Standing on blue foam, eyes closed, normal BOS x 30" x 2 Cone taps x 2; 10 times each leg with 2 fingertip UE assist Heel taps on a floor target x 10 x 2 on each, 1 UE assist Hip vectors 2 x 5 each leg        Canalith Repositioning:   Gaze Adaptation:  x1 Viewing Horizontal: Position: sitting and Reps: 10 and x1 Viewing Vertical:  Position: sitting and Reps: 10 Habituation:  Seated Vertical Head Movements: number of reps: 10 and Seated Horizontal Head Movements: number of reps: 10 Other: issued for HEP  PATIENT EDUCATION: Education details: 04/16/22: updated HEP  Person educated: Patient Education method: Explanation, Demonstration, and Handouts Education comprehension: verbalized understanding, returned demonstration, verbal cues required, and tactile cues required   HOME EXERCISE PROGRAM: 05/18/22 hip vectors Access Code: [email or web address] URL: [email or web address] Date: 03/30/2022 Prepared by: [facility]  05/06/22 - Seated Ankle Plantar Flexion with Resistance Loop  - 2 x daily - 7 x weekly - 2 sets - 10 reps 05/05/22 - Seated Toe Raise  - 3 x daily - 7 x weekly - 2 sets - 10 reps 04/16/22 - Mini Squat with Counter Support  - 1-2 x daily - 7 x weekly - 2 sets - 10 reps - 3 hold - Heel Raises with Counter Support  - 1-2 x daily - 7 x weekly - 2 sets - 10 reps 04/09/22 - Seated Calf Stretch with Strap  - 1-2 x daily - 7 x weekly - 3 reps - 30 hold Exercises - Seated Gaze Stabilization with Head Nod  - 3 x daily - 7 x weekly - 1 sets - 10-20  reps - Seated Gaze Stabilization with Head Rotation  - 3 x daily - 7 x weekly - 1 sets - 10-20 reps  GOALS: Goals reviewed with patient? No  SHORT TERM GOALS: Target date: 04/13/2022  patient will be independent with initial HEP  Baseline: Goal status: MET  2.  Patient will self report 30% improvement to improve tolerance for functional activity   Baseline:  Goal status: MET   LONG TERM GOALS: Target date: 04/27/2022  Patient will be independent in self management strategies to improve quality of life and functional outcomes.  Baseline:  Goal status: MET  2.  Patient will self report 50% improvement to improve tolerance for functional activity   Baseline:  Goal status: IN PROGRESS  3.  Patient will improve score on [place] by 20 points to demostrate decreased dizziness with functional tasks (goal 40) Baseline: 60; 58 04/27/22; 05/25/22 44 Goal status: IN PROGRESS  4.  Patient will remain free of falls Baseline: 2/27 no new falls Goal status: MET  5.  Patient will improve 5 times sit to stand score from 47.28  sec to 30 sec to demonstrate improved functional mobility and increased lower extremity strength.  Baseline: 04/27/22 30.09 sec using hands to assist up to standing; 05/20/22 19.83 without UE assist Goal status: MET  6. (Goal added 04/27/22) patient will be able to stand SLS x 10" each leg to demonstrate improved functional balance and decreased fall risk.  Baseline: 4 sec each leg; 05/25/22 met  Goal status: met   ASSESSMENT:  CLINICAL IMPRESSION Progress note today.  Met 2/2 short term goals; Met 4/6 long term goals and patient is agreeable to discharge at this time.     Eval:Patient is a 87 y.o. male who was seen today for physical therapy evaluation and treatment for dizziness. Patient presents to physical therapy with complaint of light headed feelings with position changes, turning his head and difficulty with his balance. Patient demonstrates decreased  strength, balance deficits and gait abnormalities which are negatively impacting patient ability to perform ADLs and functional mobility tasks.  Patient demonstrates increased vestibular symptoms with VOR testing which is negatively impacting patient ability to perform ADLs and functional mobility tasks. Patient will benefit from skilled physical therapy services to address these deficits to improve level of function with ADLs, functional mobility tasks, and reduce risk for falls.     OBJECTIVE IMPAIRMENTS: Abnormal gait, decreased activity tolerance, decreased balance, decreased endurance, decreased mobility, difficulty walking, decreased ROM, decreased strength, dizziness, hypomobility, increased fascial restrictions, impaired perceived functional ability, impaired flexibility, and pain.   ACTIVITY LIMITATIONS: carrying, lifting, bending, standing, stairs, bathing, toileting, dressing, locomotion level, and caring for others  PARTICIPATION LIMITATIONS: meal prep, cleaning, laundry, driving, shopping, community activity, occupation, and yard work  PERSONAL FACTORS: 1 comorbidity: neuropathy  are also affecting patient's functional outcome.   REHAB POTENTIAL: Good  CLINICAL DECISION MAKING: Stable/uncomplicated  EVALUATION COMPLEXITY: Moderate   PLAN:  PT FREQUENCY: 2x/week  PT DURATION: 4 weeks  PLANNED INTERVENTIONS: Therapeutic exercises, Therapeutic activity, Neuromuscular re-education, Balance training, Gait training, Patient/Family education, Joint manipulation, Joint mobilization, Stair training, Orthotic/Fit training, DME instructions, Aquatic Therapy, Dry Needling, Electrical stimulation, Spinal manipulation, Spinal mobilization, Cryotherapy, Moist heat, Compression bandaging, scar mobilization, Splintting, Taping, Traction, Ultrasound, Ionotophoresis 4mg /ml Dexamethasone, and Manual therapy   PLAN FOR NEXT SESSION: discharge  8:54 AM, 05/25/22 Hammad Finkler Small Drayke Grabel MPT [place]  [facility] [place] [phone] [phone]

## 2022-05-27 ENCOUNTER — Encounter (HOSPITAL_COMMUNITY): Payer: Medicare Other

## 2022-06-09 DIAGNOSIS — Z23 Encounter for immunization: Secondary | ICD-10-CM | POA: Diagnosis not present

## 2022-07-20 DIAGNOSIS — E1165 Type 2 diabetes mellitus with hyperglycemia: Secondary | ICD-10-CM | POA: Diagnosis not present

## 2022-07-20 DIAGNOSIS — R42 Dizziness and giddiness: Secondary | ICD-10-CM | POA: Diagnosis not present

## 2022-07-20 DIAGNOSIS — I10 Essential (primary) hypertension: Secondary | ICD-10-CM | POA: Diagnosis not present

## 2022-07-20 DIAGNOSIS — E1142 Type 2 diabetes mellitus with diabetic polyneuropathy: Secondary | ICD-10-CM | POA: Diagnosis not present

## 2022-07-20 DIAGNOSIS — Z299 Encounter for prophylactic measures, unspecified: Secondary | ICD-10-CM | POA: Diagnosis not present

## 2022-08-19 IMAGING — DX DG CHEST 1V PORT
1 series · 1 of 1 positions shown · non-contrast
Comparison: 04/14/2018

CLINICAL DATA: Altered mental status, confusion, lethargy

EXAM:
PORTABLE CHEST 1 VIEW

[chest ap]
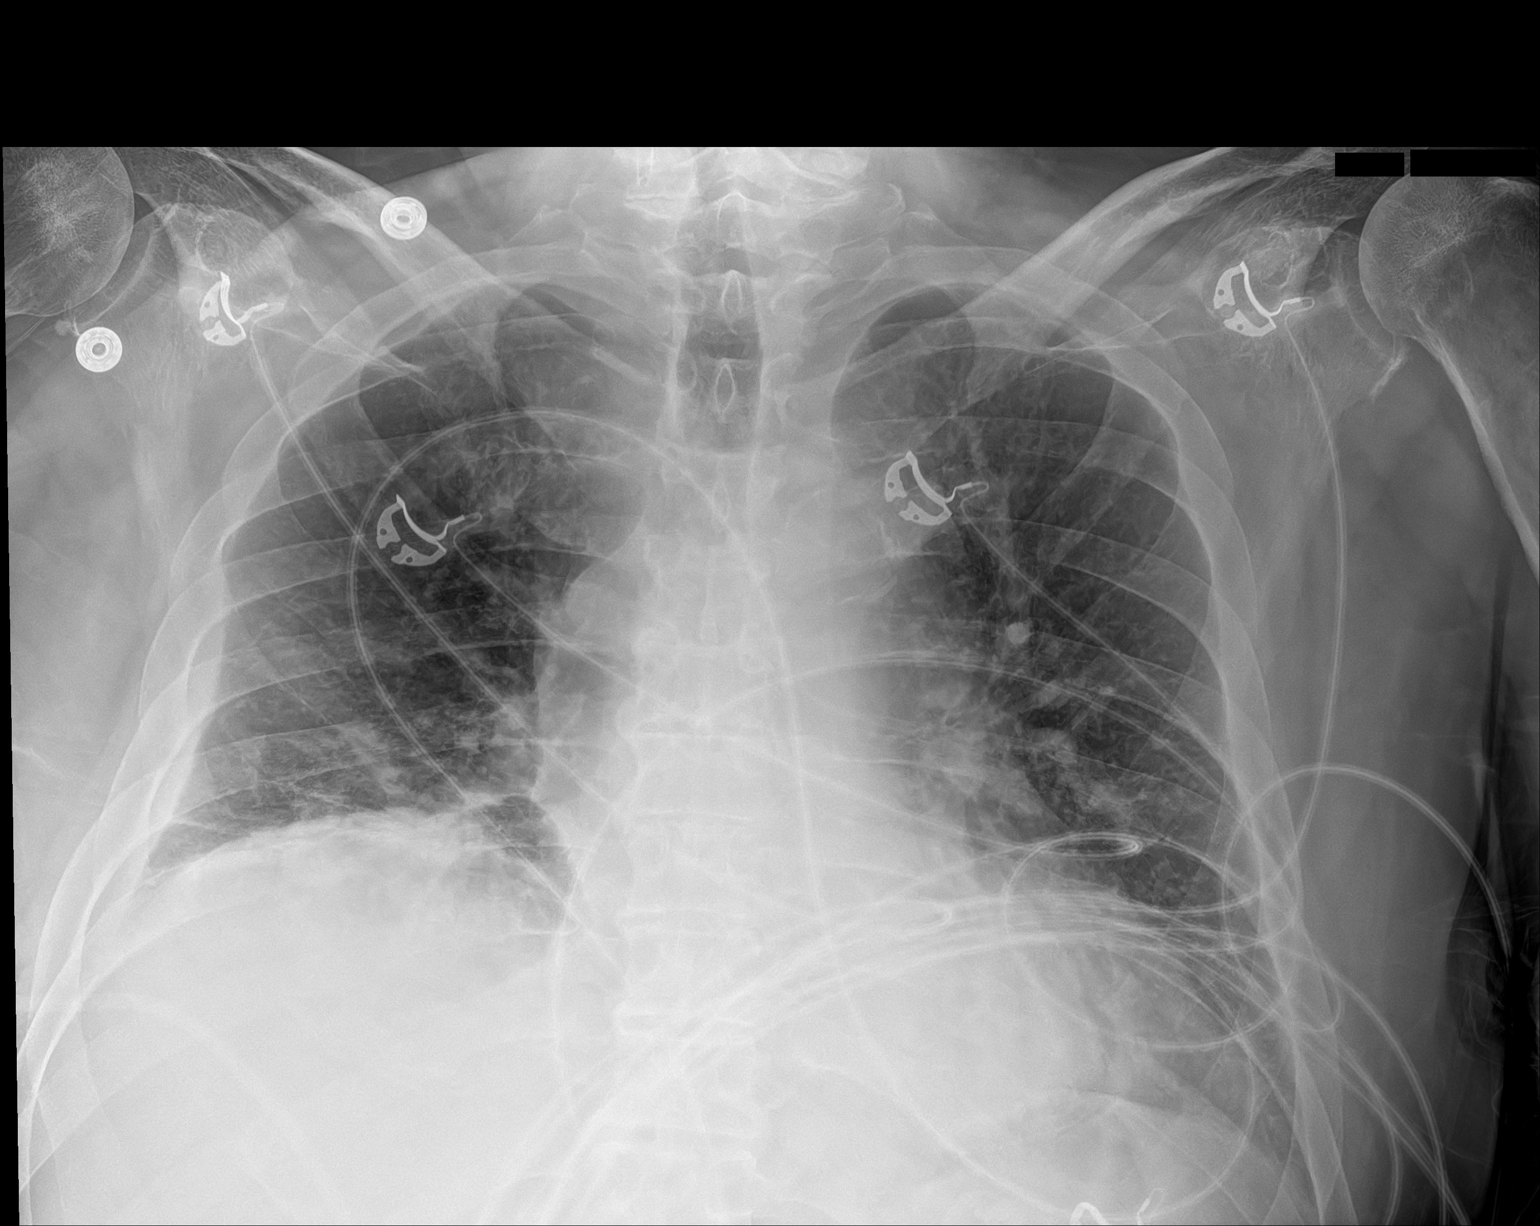

[1 of 1 positions shown; findings below may reference images not displayed]

FINDINGS: Lower lung volumes and elevated right hemidiaphragm. Increased
streaky bibasilar opacities favored to be atelectasis, less likely
pneumonia. Pleural thickening on the right. No large effusion or
pneumothorax. Trachea midline. Mild cardiac enlargement without CHF.
Bones are osteopenic. No acute osseous finding. Aorta
atherosclerotic.
IMPRESSION: Lower lung volumes with elevated right hemidiaphragm. Associated
increased basilar atelectasis.

## 2022-08-19 IMAGING — CT CT HEAD W/O CM
3 series · 16 of 47 positions shown, 19 images · non-contrast
Comparison: 4746

CLINICAL DATA: Mental status change, unknown cause

EXAM:
CT HEAD WITHOUT CONTRAST
TECHNIQUE: Contiguous axial images were obtained from the base of the skull
through the vertex without intravenous contrast.

[Series 2: head w o · axial · 0.46mm/px · z∈[+0,+140]mm · 10 of 34 slices shown, 13 images]
[im 3/34  brain]
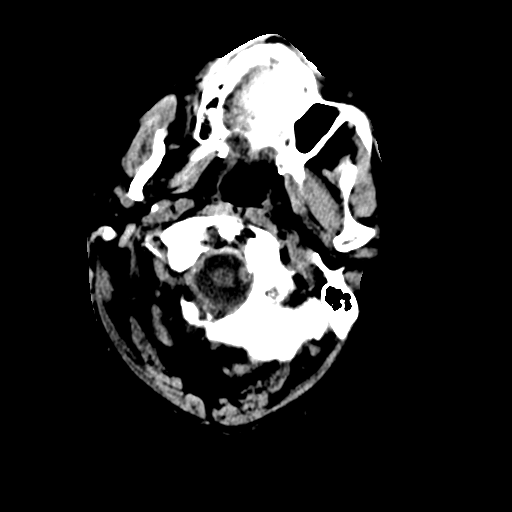
[im 3/34  bone]
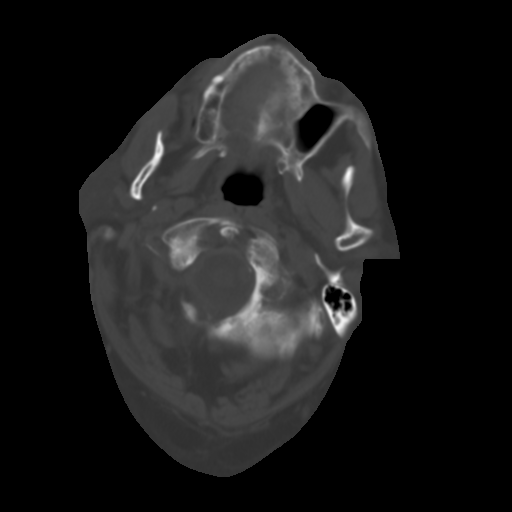
[im 6/34  brain]
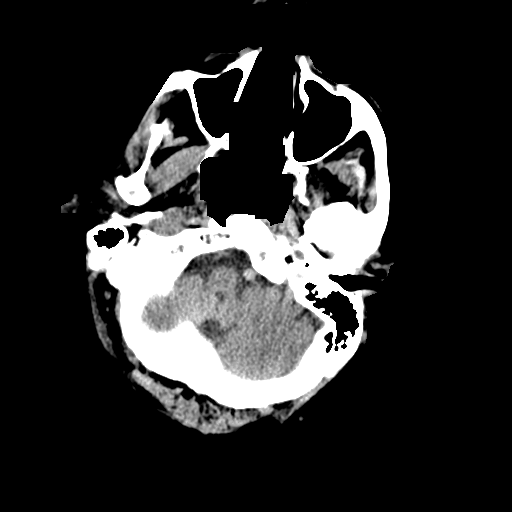
[im 10/34  brain]
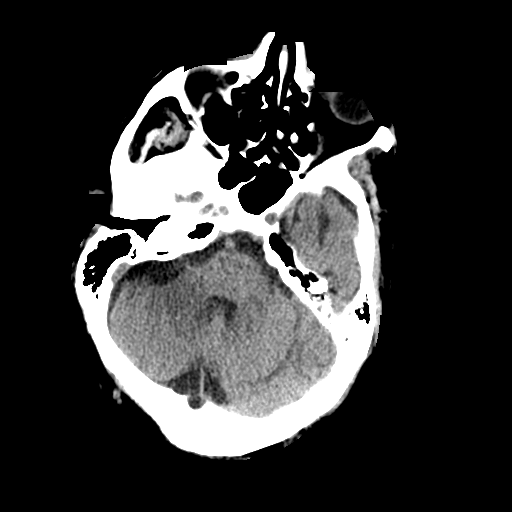
[im 12/34  brain]
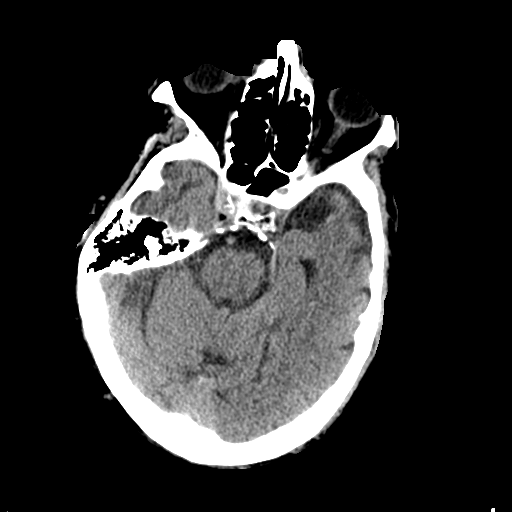
[im 15/34  brain]
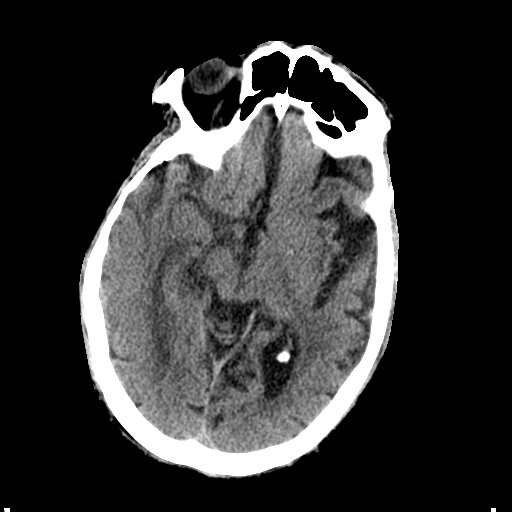
[im 15/34  bone]
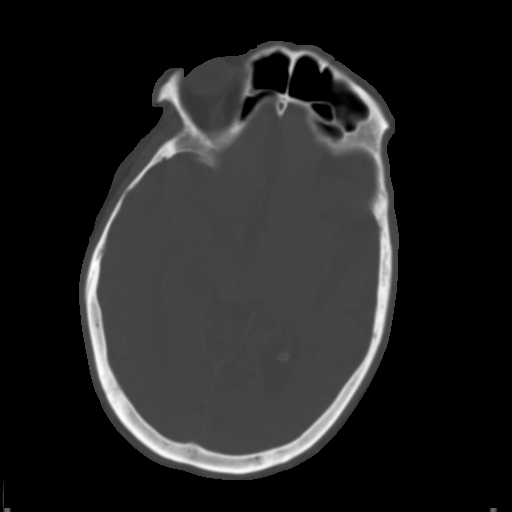
[im 19/34  brain]
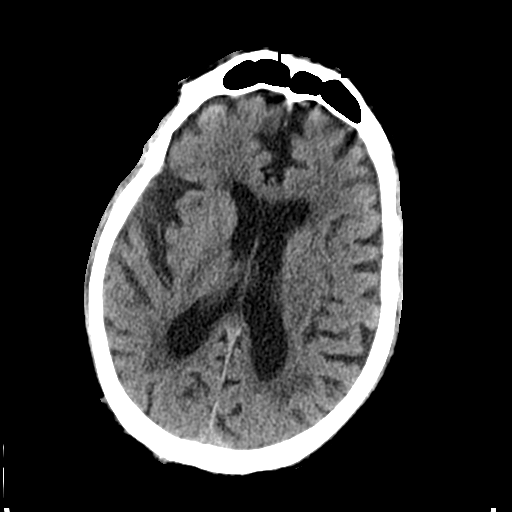
[im 22/34  brain]
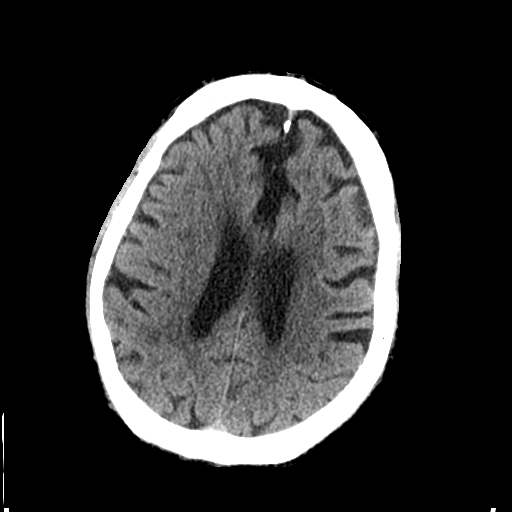
[im 26/34  brain]
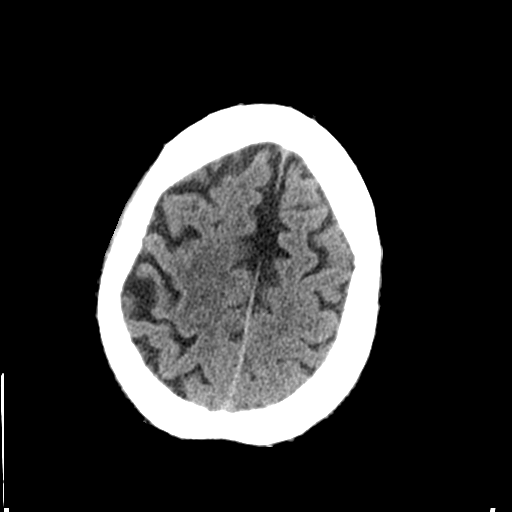
[im 28/34  brain]
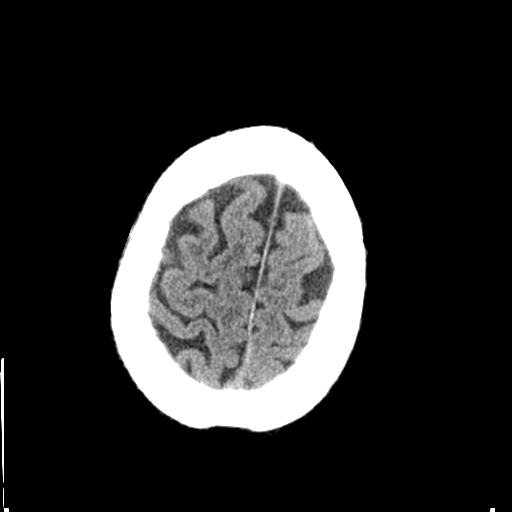
[im 28/34  bone]
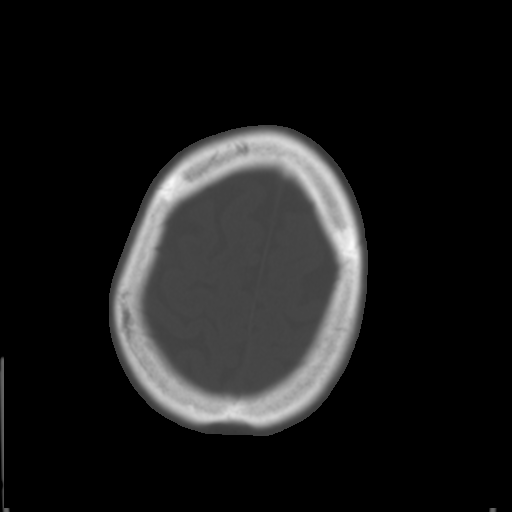
[im 31/34  brain]
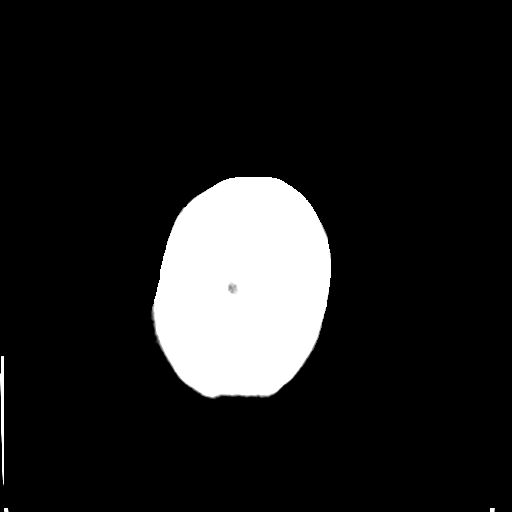

[Series 4: coronal soft · coronal · 0.35mm/px · 3 of 79 slices shown]
[im 27/79  brain]
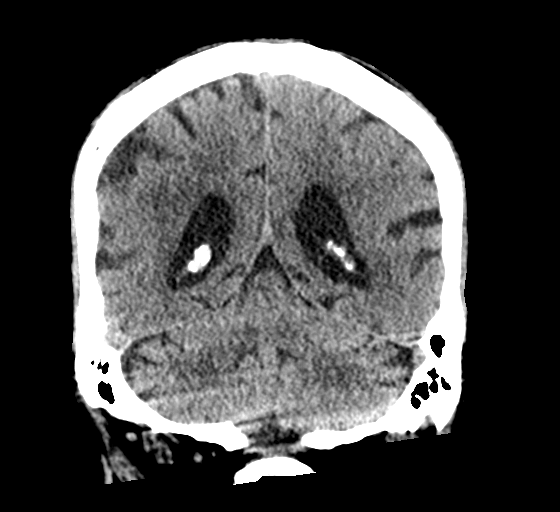
[im 35/79  brain]
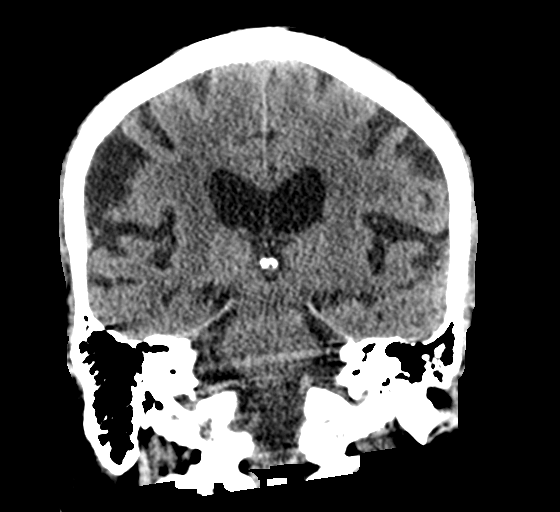
[im 44/79  brain]
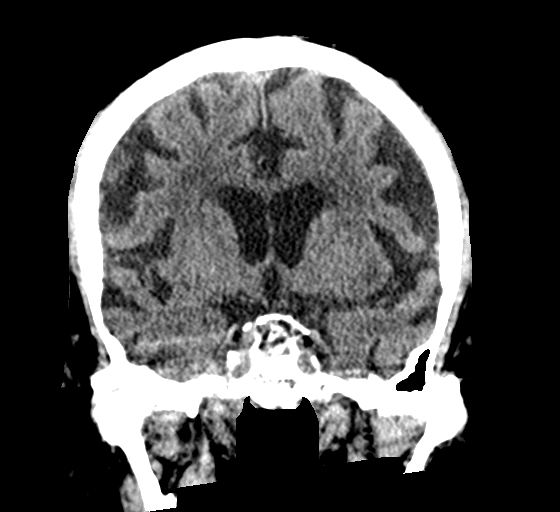

[Series 5: sagittal soft · sagittal · 0.35mm/px · 3 of 66 slices shown]
[im 27/66  brain]
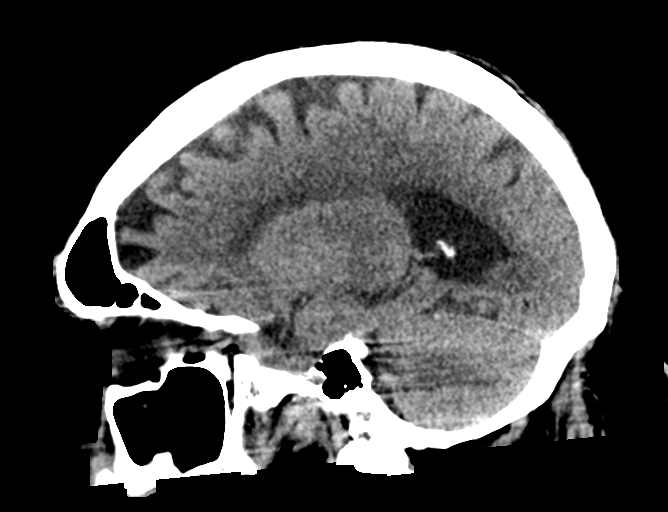
[im 33/66  brain]
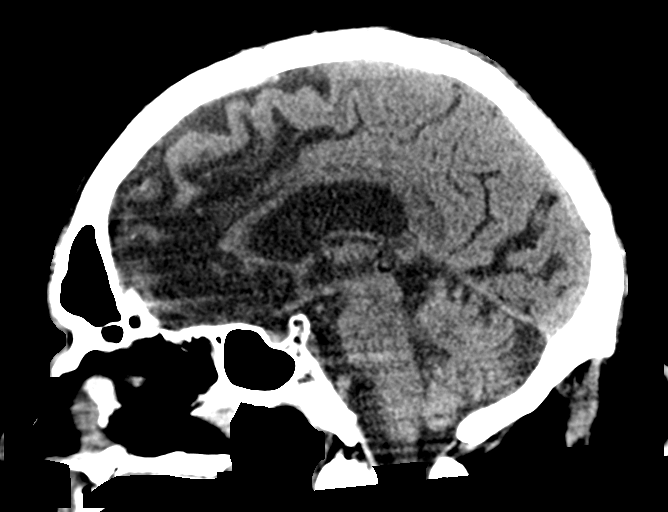
[im 39/66  brain]
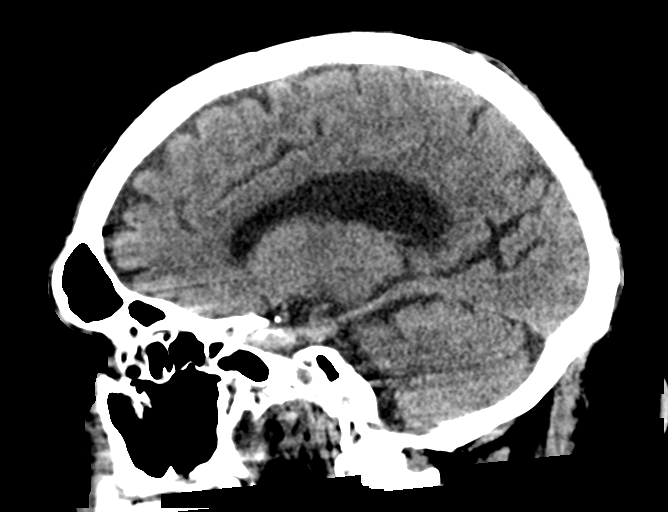

[16 of 47 positions shown; findings below may reference images not displayed]

FINDINGS: Brain: There is no acute intracranial hemorrhage, mass effect, or
edema. Gray-white differentiation is preserved. There is no
extra-axial fluid collection. Prominence of the ventricles and sulci
reflects parenchymal volume loss. Patchy hypoattenuation in the
supratentorial white matter is nonspecific but probably reflects
moderate chronic microvascular ischemic changes.

Vascular: There is atherosclerotic calcification at the skull base.

Skull: Calvarium is unremarkable.

Sinuses/Orbits: No acute finding.

Other: None.
IMPRESSION: No acute intracranial abnormality. Chronic microvascular ischemic
changes.

## 2022-09-09 DIAGNOSIS — Z7189 Other specified counseling: Secondary | ICD-10-CM | POA: Diagnosis not present

## 2022-09-09 DIAGNOSIS — Z1339 Encounter for screening examination for other mental health and behavioral disorders: Secondary | ICD-10-CM | POA: Diagnosis not present

## 2022-09-09 DIAGNOSIS — Z299 Encounter for prophylactic measures, unspecified: Secondary | ICD-10-CM | POA: Diagnosis not present

## 2022-09-09 DIAGNOSIS — Z0000 Encounter for general adult medical examination without abnormal findings: Secondary | ICD-10-CM | POA: Diagnosis not present

## 2022-09-09 DIAGNOSIS — Z79899 Other long term (current) drug therapy: Secondary | ICD-10-CM | POA: Diagnosis not present

## 2022-09-09 DIAGNOSIS — Z1331 Encounter for screening for depression: Secondary | ICD-10-CM | POA: Diagnosis not present

## 2022-09-09 DIAGNOSIS — Z125 Encounter for screening for malignant neoplasm of prostate: Secondary | ICD-10-CM | POA: Diagnosis not present

## 2022-09-09 DIAGNOSIS — R5383 Other fatigue: Secondary | ICD-10-CM | POA: Diagnosis not present

## 2022-09-09 DIAGNOSIS — E7800 Pure hypercholesterolemia, unspecified: Secondary | ICD-10-CM | POA: Diagnosis not present

## 2022-09-09 DIAGNOSIS — Z936 Other artificial openings of urinary tract status: Secondary | ICD-10-CM | POA: Diagnosis not present

## 2022-10-28 DIAGNOSIS — Z299 Encounter for prophylactic measures, unspecified: Secondary | ICD-10-CM | POA: Diagnosis not present

## 2022-10-28 DIAGNOSIS — I10 Essential (primary) hypertension: Secondary | ICD-10-CM | POA: Diagnosis not present

## 2022-10-28 DIAGNOSIS — R2681 Unsteadiness on feet: Secondary | ICD-10-CM | POA: Diagnosis not present

## 2022-10-28 DIAGNOSIS — E1165 Type 2 diabetes mellitus with hyperglycemia: Secondary | ICD-10-CM | POA: Diagnosis not present

## 2022-11-08 DIAGNOSIS — Z23 Encounter for immunization: Secondary | ICD-10-CM | POA: Diagnosis not present

## 2022-12-01 DIAGNOSIS — Z23 Encounter for immunization: Secondary | ICD-10-CM | POA: Diagnosis not present

## 2022-12-23 DIAGNOSIS — I10 Essential (primary) hypertension: Secondary | ICD-10-CM | POA: Diagnosis not present

## 2022-12-23 DIAGNOSIS — E1169 Type 2 diabetes mellitus with other specified complication: Secondary | ICD-10-CM | POA: Diagnosis not present

## 2022-12-23 DIAGNOSIS — Z299 Encounter for prophylactic measures, unspecified: Secondary | ICD-10-CM | POA: Diagnosis not present

## 2022-12-23 DIAGNOSIS — F331 Major depressive disorder, recurrent, moderate: Secondary | ICD-10-CM | POA: Diagnosis not present

## 2023-02-02 DIAGNOSIS — I10 Essential (primary) hypertension: Secondary | ICD-10-CM | POA: Diagnosis not present

## 2023-02-02 DIAGNOSIS — F331 Major depressive disorder, recurrent, moderate: Secondary | ICD-10-CM | POA: Diagnosis not present

## 2023-02-02 DIAGNOSIS — E1169 Type 2 diabetes mellitus with other specified complication: Secondary | ICD-10-CM | POA: Diagnosis not present

## 2023-02-02 DIAGNOSIS — Z299 Encounter for prophylactic measures, unspecified: Secondary | ICD-10-CM | POA: Diagnosis not present

## 2023-03-23 DIAGNOSIS — I10 Essential (primary) hypertension: Secondary | ICD-10-CM | POA: Diagnosis not present

## 2023-03-23 DIAGNOSIS — Z299 Encounter for prophylactic measures, unspecified: Secondary | ICD-10-CM | POA: Diagnosis not present

## 2023-03-23 DIAGNOSIS — E1169 Type 2 diabetes mellitus with other specified complication: Secondary | ICD-10-CM | POA: Diagnosis not present

## 2023-03-23 DIAGNOSIS — R5383 Other fatigue: Secondary | ICD-10-CM | POA: Diagnosis not present

## 2023-03-23 DIAGNOSIS — I700 Atherosclerosis of aorta: Secondary | ICD-10-CM | POA: Diagnosis not present

## 2023-03-31 DIAGNOSIS — N3001 Acute cystitis with hematuria: Secondary | ICD-10-CM | POA: Diagnosis not present

## 2023-03-31 DIAGNOSIS — R918 Other nonspecific abnormal finding of lung field: Secondary | ICD-10-CM | POA: Diagnosis not present

## 2023-03-31 DIAGNOSIS — R197 Diarrhea, unspecified: Secondary | ICD-10-CM | POA: Diagnosis not present

## 2023-03-31 DIAGNOSIS — E860 Dehydration: Secondary | ICD-10-CM | POA: Diagnosis not present

## 2023-03-31 DIAGNOSIS — R110 Nausea: Secondary | ICD-10-CM | POA: Diagnosis not present

## 2023-03-31 DIAGNOSIS — K5730 Diverticulosis of large intestine without perforation or abscess without bleeding: Secondary | ICD-10-CM | POA: Diagnosis not present

## 2023-03-31 DIAGNOSIS — K439 Ventral hernia without obstruction or gangrene: Secondary | ICD-10-CM | POA: Diagnosis not present

## 2023-03-31 DIAGNOSIS — K449 Diaphragmatic hernia without obstruction or gangrene: Secondary | ICD-10-CM | POA: Diagnosis not present

## 2023-03-31 DIAGNOSIS — Z20822 Contact with and (suspected) exposure to covid-19: Secondary | ICD-10-CM | POA: Diagnosis not present

## 2023-03-31 DIAGNOSIS — R509 Fever, unspecified: Secondary | ICD-10-CM | POA: Diagnosis not present

## 2023-03-31 DIAGNOSIS — R531 Weakness: Secondary | ICD-10-CM | POA: Diagnosis not present

## 2023-04-22 DIAGNOSIS — R143 Flatulence: Secondary | ICD-10-CM | POA: Diagnosis not present

## 2023-04-22 DIAGNOSIS — I10 Essential (primary) hypertension: Secondary | ICD-10-CM | POA: Diagnosis not present

## 2023-04-22 DIAGNOSIS — Z299 Encounter for prophylactic measures, unspecified: Secondary | ICD-10-CM | POA: Diagnosis not present

## 2023-04-22 DIAGNOSIS — F331 Major depressive disorder, recurrent, moderate: Secondary | ICD-10-CM | POA: Diagnosis not present

## 2023-04-22 DIAGNOSIS — N390 Urinary tract infection, site not specified: Secondary | ICD-10-CM | POA: Diagnosis not present

## 2023-04-22 DIAGNOSIS — R2681 Unsteadiness on feet: Secondary | ICD-10-CM | POA: Diagnosis not present

## 2023-05-03 DIAGNOSIS — N390 Urinary tract infection, site not specified: Secondary | ICD-10-CM | POA: Diagnosis not present

## 2023-05-03 DIAGNOSIS — Z8551 Personal history of malignant neoplasm of bladder: Secondary | ICD-10-CM | POA: Diagnosis not present

## 2023-05-03 DIAGNOSIS — I700 Atherosclerosis of aorta: Secondary | ICD-10-CM | POA: Diagnosis not present

## 2023-05-03 DIAGNOSIS — G4700 Insomnia, unspecified: Secondary | ICD-10-CM | POA: Diagnosis not present

## 2023-05-03 DIAGNOSIS — E7800 Pure hypercholesterolemia, unspecified: Secondary | ICD-10-CM | POA: Diagnosis not present

## 2023-05-03 DIAGNOSIS — F419 Anxiety disorder, unspecified: Secondary | ICD-10-CM | POA: Diagnosis not present

## 2023-05-03 DIAGNOSIS — K2100 Gastro-esophageal reflux disease with esophagitis, without bleeding: Secondary | ICD-10-CM | POA: Diagnosis not present

## 2023-05-03 DIAGNOSIS — Z87442 Personal history of urinary calculi: Secondary | ICD-10-CM | POA: Diagnosis not present

## 2023-05-03 DIAGNOSIS — I10 Essential (primary) hypertension: Secondary | ICD-10-CM | POA: Diagnosis not present

## 2023-05-03 DIAGNOSIS — E1142 Type 2 diabetes mellitus with diabetic polyneuropathy: Secondary | ICD-10-CM | POA: Diagnosis not present

## 2023-05-03 DIAGNOSIS — F331 Major depressive disorder, recurrent, moderate: Secondary | ICD-10-CM | POA: Diagnosis not present

## 2023-05-11 DIAGNOSIS — I10 Essential (primary) hypertension: Secondary | ICD-10-CM | POA: Diagnosis not present

## 2023-05-11 DIAGNOSIS — E1169 Type 2 diabetes mellitus with other specified complication: Secondary | ICD-10-CM | POA: Diagnosis not present

## 2023-05-11 DIAGNOSIS — Z299 Encounter for prophylactic measures, unspecified: Secondary | ICD-10-CM | POA: Diagnosis not present

## 2023-05-18 DIAGNOSIS — K2100 Gastro-esophageal reflux disease with esophagitis, without bleeding: Secondary | ICD-10-CM | POA: Diagnosis not present

## 2023-05-18 DIAGNOSIS — N390 Urinary tract infection, site not specified: Secondary | ICD-10-CM | POA: Diagnosis not present

## 2023-05-18 DIAGNOSIS — E1142 Type 2 diabetes mellitus with diabetic polyneuropathy: Secondary | ICD-10-CM | POA: Diagnosis not present

## 2023-05-18 DIAGNOSIS — G4700 Insomnia, unspecified: Secondary | ICD-10-CM | POA: Diagnosis not present

## 2023-05-18 DIAGNOSIS — I700 Atherosclerosis of aorta: Secondary | ICD-10-CM | POA: Diagnosis not present

## 2023-05-18 DIAGNOSIS — I10 Essential (primary) hypertension: Secondary | ICD-10-CM | POA: Diagnosis not present

## 2023-05-19 DIAGNOSIS — Z23 Encounter for immunization: Secondary | ICD-10-CM | POA: Diagnosis not present

## 2023-05-23 DIAGNOSIS — I10 Essential (primary) hypertension: Secondary | ICD-10-CM | POA: Diagnosis not present

## 2023-05-23 DIAGNOSIS — I700 Atherosclerosis of aorta: Secondary | ICD-10-CM | POA: Diagnosis not present

## 2023-05-23 DIAGNOSIS — N390 Urinary tract infection, site not specified: Secondary | ICD-10-CM | POA: Diagnosis not present

## 2023-05-23 DIAGNOSIS — G4700 Insomnia, unspecified: Secondary | ICD-10-CM | POA: Diagnosis not present

## 2023-05-23 DIAGNOSIS — E1142 Type 2 diabetes mellitus with diabetic polyneuropathy: Secondary | ICD-10-CM | POA: Diagnosis not present

## 2023-05-23 DIAGNOSIS — K2100 Gastro-esophageal reflux disease with esophagitis, without bleeding: Secondary | ICD-10-CM | POA: Diagnosis not present

## 2023-06-01 DIAGNOSIS — K2100 Gastro-esophageal reflux disease with esophagitis, without bleeding: Secondary | ICD-10-CM | POA: Diagnosis not present

## 2023-06-01 DIAGNOSIS — G4700 Insomnia, unspecified: Secondary | ICD-10-CM | POA: Diagnosis not present

## 2023-06-01 DIAGNOSIS — N390 Urinary tract infection, site not specified: Secondary | ICD-10-CM | POA: Diagnosis not present

## 2023-06-01 DIAGNOSIS — I700 Atherosclerosis of aorta: Secondary | ICD-10-CM | POA: Diagnosis not present

## 2023-06-01 DIAGNOSIS — I10 Essential (primary) hypertension: Secondary | ICD-10-CM | POA: Diagnosis not present

## 2023-06-01 DIAGNOSIS — E1142 Type 2 diabetes mellitus with diabetic polyneuropathy: Secondary | ICD-10-CM | POA: Diagnosis not present

## 2023-06-02 DIAGNOSIS — E7800 Pure hypercholesterolemia, unspecified: Secondary | ICD-10-CM | POA: Diagnosis not present

## 2023-06-02 DIAGNOSIS — I10 Essential (primary) hypertension: Secondary | ICD-10-CM | POA: Diagnosis not present

## 2023-06-02 DIAGNOSIS — K2100 Gastro-esophageal reflux disease with esophagitis, without bleeding: Secondary | ICD-10-CM | POA: Diagnosis not present

## 2023-06-02 DIAGNOSIS — F419 Anxiety disorder, unspecified: Secondary | ICD-10-CM | POA: Diagnosis not present

## 2023-06-02 DIAGNOSIS — Z87442 Personal history of urinary calculi: Secondary | ICD-10-CM | POA: Diagnosis not present

## 2023-06-02 DIAGNOSIS — E1142 Type 2 diabetes mellitus with diabetic polyneuropathy: Secondary | ICD-10-CM | POA: Diagnosis not present

## 2023-06-02 DIAGNOSIS — I700 Atherosclerosis of aorta: Secondary | ICD-10-CM | POA: Diagnosis not present

## 2023-06-02 DIAGNOSIS — N390 Urinary tract infection, site not specified: Secondary | ICD-10-CM | POA: Diagnosis not present

## 2023-06-02 DIAGNOSIS — F331 Major depressive disorder, recurrent, moderate: Secondary | ICD-10-CM | POA: Diagnosis not present

## 2023-06-02 DIAGNOSIS — G4700 Insomnia, unspecified: Secondary | ICD-10-CM | POA: Diagnosis not present

## 2023-06-02 DIAGNOSIS — Z8551 Personal history of malignant neoplasm of bladder: Secondary | ICD-10-CM | POA: Diagnosis not present

## 2023-06-07 DIAGNOSIS — E1142 Type 2 diabetes mellitus with diabetic polyneuropathy: Secondary | ICD-10-CM | POA: Diagnosis not present

## 2023-06-07 DIAGNOSIS — I10 Essential (primary) hypertension: Secondary | ICD-10-CM | POA: Diagnosis not present

## 2023-06-07 DIAGNOSIS — N390 Urinary tract infection, site not specified: Secondary | ICD-10-CM | POA: Diagnosis not present

## 2023-06-07 DIAGNOSIS — K2100 Gastro-esophageal reflux disease with esophagitis, without bleeding: Secondary | ICD-10-CM | POA: Diagnosis not present

## 2023-06-10 DIAGNOSIS — N390 Urinary tract infection, site not specified: Secondary | ICD-10-CM | POA: Diagnosis not present

## 2023-06-10 DIAGNOSIS — E1142 Type 2 diabetes mellitus with diabetic polyneuropathy: Secondary | ICD-10-CM | POA: Diagnosis not present

## 2023-06-10 DIAGNOSIS — K2100 Gastro-esophageal reflux disease with esophagitis, without bleeding: Secondary | ICD-10-CM | POA: Diagnosis not present

## 2023-06-10 DIAGNOSIS — G4700 Insomnia, unspecified: Secondary | ICD-10-CM | POA: Diagnosis not present

## 2023-06-10 DIAGNOSIS — I10 Essential (primary) hypertension: Secondary | ICD-10-CM | POA: Diagnosis not present

## 2023-06-10 DIAGNOSIS — I700 Atherosclerosis of aorta: Secondary | ICD-10-CM | POA: Diagnosis not present

## 2023-06-13 DIAGNOSIS — K2100 Gastro-esophageal reflux disease with esophagitis, without bleeding: Secondary | ICD-10-CM | POA: Diagnosis not present

## 2023-06-13 DIAGNOSIS — N390 Urinary tract infection, site not specified: Secondary | ICD-10-CM | POA: Diagnosis not present

## 2023-06-13 DIAGNOSIS — I10 Essential (primary) hypertension: Secondary | ICD-10-CM | POA: Diagnosis not present

## 2023-06-13 DIAGNOSIS — E1142 Type 2 diabetes mellitus with diabetic polyneuropathy: Secondary | ICD-10-CM | POA: Diagnosis not present

## 2023-06-16 DIAGNOSIS — K2100 Gastro-esophageal reflux disease with esophagitis, without bleeding: Secondary | ICD-10-CM | POA: Diagnosis not present

## 2023-06-16 DIAGNOSIS — N390 Urinary tract infection, site not specified: Secondary | ICD-10-CM | POA: Diagnosis not present

## 2023-06-16 DIAGNOSIS — E1142 Type 2 diabetes mellitus with diabetic polyneuropathy: Secondary | ICD-10-CM | POA: Diagnosis not present

## 2023-06-16 DIAGNOSIS — I10 Essential (primary) hypertension: Secondary | ICD-10-CM | POA: Diagnosis not present

## 2023-06-21 DIAGNOSIS — I10 Essential (primary) hypertension: Secondary | ICD-10-CM | POA: Diagnosis not present

## 2023-06-21 DIAGNOSIS — E1142 Type 2 diabetes mellitus with diabetic polyneuropathy: Secondary | ICD-10-CM | POA: Diagnosis not present

## 2023-06-21 DIAGNOSIS — N390 Urinary tract infection, site not specified: Secondary | ICD-10-CM | POA: Diagnosis not present

## 2023-06-21 DIAGNOSIS — K2100 Gastro-esophageal reflux disease with esophagitis, without bleeding: Secondary | ICD-10-CM | POA: Diagnosis not present

## 2023-06-23 DIAGNOSIS — K2100 Gastro-esophageal reflux disease with esophagitis, without bleeding: Secondary | ICD-10-CM | POA: Diagnosis not present

## 2023-06-23 DIAGNOSIS — I700 Atherosclerosis of aorta: Secondary | ICD-10-CM | POA: Diagnosis not present

## 2023-06-23 DIAGNOSIS — E1142 Type 2 diabetes mellitus with diabetic polyneuropathy: Secondary | ICD-10-CM | POA: Diagnosis not present

## 2023-06-23 DIAGNOSIS — N390 Urinary tract infection, site not specified: Secondary | ICD-10-CM | POA: Diagnosis not present

## 2023-06-23 DIAGNOSIS — G4700 Insomnia, unspecified: Secondary | ICD-10-CM | POA: Diagnosis not present

## 2023-06-23 DIAGNOSIS — I10 Essential (primary) hypertension: Secondary | ICD-10-CM | POA: Diagnosis not present

## 2023-06-29 DIAGNOSIS — E1142 Type 2 diabetes mellitus with diabetic polyneuropathy: Secondary | ICD-10-CM | POA: Diagnosis not present

## 2023-06-29 DIAGNOSIS — I700 Atherosclerosis of aorta: Secondary | ICD-10-CM | POA: Diagnosis not present

## 2023-06-29 DIAGNOSIS — I10 Essential (primary) hypertension: Secondary | ICD-10-CM | POA: Diagnosis not present

## 2023-06-29 DIAGNOSIS — G4700 Insomnia, unspecified: Secondary | ICD-10-CM | POA: Diagnosis not present

## 2023-06-29 DIAGNOSIS — N390 Urinary tract infection, site not specified: Secondary | ICD-10-CM | POA: Diagnosis not present

## 2023-06-29 DIAGNOSIS — K2100 Gastro-esophageal reflux disease with esophagitis, without bleeding: Secondary | ICD-10-CM | POA: Diagnosis not present

## 2023-07-02 DIAGNOSIS — E1142 Type 2 diabetes mellitus with diabetic polyneuropathy: Secondary | ICD-10-CM | POA: Diagnosis not present

## 2023-07-02 DIAGNOSIS — Z87442 Personal history of urinary calculi: Secondary | ICD-10-CM | POA: Diagnosis not present

## 2023-07-02 DIAGNOSIS — K2100 Gastro-esophageal reflux disease with esophagitis, without bleeding: Secondary | ICD-10-CM | POA: Diagnosis not present

## 2023-07-02 DIAGNOSIS — Z8551 Personal history of malignant neoplasm of bladder: Secondary | ICD-10-CM | POA: Diagnosis not present

## 2023-07-02 DIAGNOSIS — I700 Atherosclerosis of aorta: Secondary | ICD-10-CM | POA: Diagnosis not present

## 2023-07-02 DIAGNOSIS — E7800 Pure hypercholesterolemia, unspecified: Secondary | ICD-10-CM | POA: Diagnosis not present

## 2023-07-02 DIAGNOSIS — F331 Major depressive disorder, recurrent, moderate: Secondary | ICD-10-CM | POA: Diagnosis not present

## 2023-07-02 DIAGNOSIS — Z87440 Personal history of urinary (tract) infections: Secondary | ICD-10-CM | POA: Diagnosis not present

## 2023-07-02 DIAGNOSIS — G4700 Insomnia, unspecified: Secondary | ICD-10-CM | POA: Diagnosis not present

## 2023-07-02 DIAGNOSIS — F419 Anxiety disorder, unspecified: Secondary | ICD-10-CM | POA: Diagnosis not present

## 2023-07-02 DIAGNOSIS — I10 Essential (primary) hypertension: Secondary | ICD-10-CM | POA: Diagnosis not present

## 2023-07-05 DIAGNOSIS — E1142 Type 2 diabetes mellitus with diabetic polyneuropathy: Secondary | ICD-10-CM | POA: Diagnosis not present

## 2023-07-05 DIAGNOSIS — I700 Atherosclerosis of aorta: Secondary | ICD-10-CM | POA: Diagnosis not present

## 2023-07-05 DIAGNOSIS — E7800 Pure hypercholesterolemia, unspecified: Secondary | ICD-10-CM | POA: Diagnosis not present

## 2023-07-05 DIAGNOSIS — G4700 Insomnia, unspecified: Secondary | ICD-10-CM | POA: Diagnosis not present

## 2023-07-05 DIAGNOSIS — I10 Essential (primary) hypertension: Secondary | ICD-10-CM | POA: Diagnosis not present

## 2023-07-05 DIAGNOSIS — K2100 Gastro-esophageal reflux disease with esophagitis, without bleeding: Secondary | ICD-10-CM | POA: Diagnosis not present

## 2023-07-12 DIAGNOSIS — E1142 Type 2 diabetes mellitus with diabetic polyneuropathy: Secondary | ICD-10-CM | POA: Diagnosis not present

## 2023-07-12 DIAGNOSIS — I700 Atherosclerosis of aorta: Secondary | ICD-10-CM | POA: Diagnosis not present

## 2023-07-12 DIAGNOSIS — E7800 Pure hypercholesterolemia, unspecified: Secondary | ICD-10-CM | POA: Diagnosis not present

## 2023-07-12 DIAGNOSIS — G4700 Insomnia, unspecified: Secondary | ICD-10-CM | POA: Diagnosis not present

## 2023-07-12 DIAGNOSIS — K2100 Gastro-esophageal reflux disease with esophagitis, without bleeding: Secondary | ICD-10-CM | POA: Diagnosis not present

## 2023-07-12 DIAGNOSIS — I10 Essential (primary) hypertension: Secondary | ICD-10-CM | POA: Diagnosis not present

## 2023-07-19 DIAGNOSIS — J111 Influenza due to unidentified influenza virus with other respiratory manifestations: Secondary | ICD-10-CM | POA: Diagnosis not present

## 2023-07-19 DIAGNOSIS — R531 Weakness: Secondary | ICD-10-CM | POA: Diagnosis not present

## 2023-07-19 DIAGNOSIS — I10 Essential (primary) hypertension: Secondary | ICD-10-CM | POA: Diagnosis not present

## 2023-07-19 DIAGNOSIS — R110 Nausea: Secondary | ICD-10-CM | POA: Diagnosis not present

## 2023-07-19 DIAGNOSIS — R6889 Other general symptoms and signs: Secondary | ICD-10-CM | POA: Diagnosis not present

## 2023-07-19 DIAGNOSIS — Z299 Encounter for prophylactic measures, unspecified: Secondary | ICD-10-CM | POA: Diagnosis not present

## 2023-07-19 DIAGNOSIS — E1169 Type 2 diabetes mellitus with other specified complication: Secondary | ICD-10-CM | POA: Diagnosis not present

## 2023-07-19 DIAGNOSIS — E1142 Type 2 diabetes mellitus with diabetic polyneuropathy: Secondary | ICD-10-CM | POA: Diagnosis not present

## 2023-07-24 DIAGNOSIS — J159 Unspecified bacterial pneumonia: Secondary | ICD-10-CM | POA: Diagnosis not present

## 2023-07-24 DIAGNOSIS — R059 Cough, unspecified: Secondary | ICD-10-CM | POA: Diagnosis not present

## 2023-07-24 DIAGNOSIS — R0989 Other specified symptoms and signs involving the circulatory and respiratory systems: Secondary | ICD-10-CM | POA: Diagnosis not present

## 2023-07-24 DIAGNOSIS — Z9981 Dependence on supplemental oxygen: Secondary | ICD-10-CM | POA: Diagnosis not present

## 2023-07-24 DIAGNOSIS — C679 Malignant neoplasm of bladder, unspecified: Secondary | ICD-10-CM | POA: Diagnosis not present

## 2023-07-24 DIAGNOSIS — Z906 Acquired absence of other parts of urinary tract: Secondary | ICD-10-CM | POA: Diagnosis not present

## 2023-07-24 DIAGNOSIS — I959 Hypotension, unspecified: Secondary | ICD-10-CM | POA: Diagnosis not present

## 2023-07-24 DIAGNOSIS — Z8551 Personal history of malignant neoplasm of bladder: Secondary | ICD-10-CM | POA: Diagnosis not present

## 2023-07-24 DIAGNOSIS — Z792 Long term (current) use of antibiotics: Secondary | ICD-10-CM | POA: Diagnosis not present

## 2023-07-24 DIAGNOSIS — R531 Weakness: Secondary | ICD-10-CM | POA: Diagnosis not present

## 2023-07-24 DIAGNOSIS — R5381 Other malaise: Secondary | ICD-10-CM | POA: Diagnosis not present

## 2023-07-24 DIAGNOSIS — I10 Essential (primary) hypertension: Secondary | ICD-10-CM | POA: Diagnosis not present

## 2023-07-24 DIAGNOSIS — R5383 Other fatigue: Secondary | ICD-10-CM | POA: Diagnosis not present

## 2023-07-24 DIAGNOSIS — E860 Dehydration: Secondary | ICD-10-CM | POA: Diagnosis not present

## 2023-07-24 DIAGNOSIS — J189 Pneumonia, unspecified organism: Secondary | ICD-10-CM | POA: Diagnosis not present

## 2023-07-24 DIAGNOSIS — R918 Other nonspecific abnormal finding of lung field: Secondary | ICD-10-CM | POA: Diagnosis not present

## 2023-07-24 DIAGNOSIS — Z20822 Contact with and (suspected) exposure to covid-19: Secondary | ICD-10-CM | POA: Diagnosis not present

## 2023-07-24 DIAGNOSIS — Z1152 Encounter for screening for COVID-19: Secondary | ICD-10-CM | POA: Diagnosis not present

## 2023-07-24 DIAGNOSIS — J1100 Influenza due to unidentified influenza virus with unspecified type of pneumonia: Secondary | ICD-10-CM | POA: Diagnosis not present

## 2023-07-24 DIAGNOSIS — I4891 Unspecified atrial fibrillation: Secondary | ICD-10-CM | POA: Diagnosis not present

## 2023-07-24 DIAGNOSIS — Z79899 Other long term (current) drug therapy: Secondary | ICD-10-CM | POA: Diagnosis not present

## 2023-07-28 DIAGNOSIS — I10 Essential (primary) hypertension: Secondary | ICD-10-CM | POA: Diagnosis not present

## 2023-07-28 DIAGNOSIS — K2100 Gastro-esophageal reflux disease with esophagitis, without bleeding: Secondary | ICD-10-CM | POA: Diagnosis not present

## 2023-07-28 DIAGNOSIS — E1142 Type 2 diabetes mellitus with diabetic polyneuropathy: Secondary | ICD-10-CM | POA: Diagnosis not present

## 2023-07-28 DIAGNOSIS — G4700 Insomnia, unspecified: Secondary | ICD-10-CM | POA: Diagnosis not present

## 2023-07-29 DIAGNOSIS — I10 Essential (primary) hypertension: Secondary | ICD-10-CM | POA: Diagnosis not present

## 2023-07-29 DIAGNOSIS — G4700 Insomnia, unspecified: Secondary | ICD-10-CM | POA: Diagnosis not present

## 2023-07-29 DIAGNOSIS — E1142 Type 2 diabetes mellitus with diabetic polyneuropathy: Secondary | ICD-10-CM | POA: Diagnosis not present

## 2023-07-29 DIAGNOSIS — K2100 Gastro-esophageal reflux disease with esophagitis, without bleeding: Secondary | ICD-10-CM | POA: Diagnosis not present

## 2023-07-29 DIAGNOSIS — I700 Atherosclerosis of aorta: Secondary | ICD-10-CM | POA: Diagnosis not present

## 2023-07-29 DIAGNOSIS — E7800 Pure hypercholesterolemia, unspecified: Secondary | ICD-10-CM | POA: Diagnosis not present

## 2023-08-01 DIAGNOSIS — G4700 Insomnia, unspecified: Secondary | ICD-10-CM | POA: Diagnosis not present

## 2023-08-01 DIAGNOSIS — K219 Gastro-esophageal reflux disease without esophagitis: Secondary | ICD-10-CM | POA: Diagnosis not present

## 2023-08-01 DIAGNOSIS — Z8551 Personal history of malignant neoplasm of bladder: Secondary | ICD-10-CM | POA: Diagnosis not present

## 2023-08-01 DIAGNOSIS — I10 Essential (primary) hypertension: Secondary | ICD-10-CM | POA: Diagnosis not present

## 2023-08-01 DIAGNOSIS — K2100 Gastro-esophageal reflux disease with esophagitis, without bleeding: Secondary | ICD-10-CM | POA: Diagnosis not present

## 2023-08-01 DIAGNOSIS — F331 Major depressive disorder, recurrent, moderate: Secondary | ICD-10-CM | POA: Diagnosis not present

## 2023-08-01 DIAGNOSIS — R110 Nausea: Secondary | ICD-10-CM | POA: Diagnosis not present

## 2023-08-01 DIAGNOSIS — Z87442 Personal history of urinary calculi: Secondary | ICD-10-CM | POA: Diagnosis not present

## 2023-08-01 DIAGNOSIS — J189 Pneumonia, unspecified organism: Secondary | ICD-10-CM | POA: Diagnosis not present

## 2023-08-01 DIAGNOSIS — Z299 Encounter for prophylactic measures, unspecified: Secondary | ICD-10-CM | POA: Diagnosis not present

## 2023-08-01 DIAGNOSIS — J159 Unspecified bacterial pneumonia: Secondary | ICD-10-CM | POA: Diagnosis not present

## 2023-08-01 DIAGNOSIS — Z87440 Personal history of urinary (tract) infections: Secondary | ICD-10-CM | POA: Diagnosis not present

## 2023-08-01 DIAGNOSIS — E1142 Type 2 diabetes mellitus with diabetic polyneuropathy: Secondary | ICD-10-CM | POA: Diagnosis not present

## 2023-08-01 DIAGNOSIS — R6889 Other general symptoms and signs: Secondary | ICD-10-CM | POA: Diagnosis not present

## 2023-08-01 DIAGNOSIS — F419 Anxiety disorder, unspecified: Secondary | ICD-10-CM | POA: Diagnosis not present

## 2023-08-01 DIAGNOSIS — E7800 Pure hypercholesterolemia, unspecified: Secondary | ICD-10-CM | POA: Diagnosis not present

## 2023-08-01 DIAGNOSIS — I700 Atherosclerosis of aorta: Secondary | ICD-10-CM | POA: Diagnosis not present

## 2023-08-02 DIAGNOSIS — K2100 Gastro-esophageal reflux disease with esophagitis, without bleeding: Secondary | ICD-10-CM | POA: Diagnosis not present

## 2023-08-02 DIAGNOSIS — G4700 Insomnia, unspecified: Secondary | ICD-10-CM | POA: Diagnosis not present

## 2023-08-02 DIAGNOSIS — I700 Atherosclerosis of aorta: Secondary | ICD-10-CM | POA: Diagnosis not present

## 2023-08-02 DIAGNOSIS — J159 Unspecified bacterial pneumonia: Secondary | ICD-10-CM | POA: Diagnosis not present

## 2023-08-02 DIAGNOSIS — E1142 Type 2 diabetes mellitus with diabetic polyneuropathy: Secondary | ICD-10-CM | POA: Diagnosis not present

## 2023-08-02 DIAGNOSIS — I10 Essential (primary) hypertension: Secondary | ICD-10-CM | POA: Diagnosis not present

## 2023-08-09 DIAGNOSIS — K2100 Gastro-esophageal reflux disease with esophagitis, without bleeding: Secondary | ICD-10-CM | POA: Diagnosis not present

## 2023-08-09 DIAGNOSIS — I700 Atherosclerosis of aorta: Secondary | ICD-10-CM | POA: Diagnosis not present

## 2023-08-09 DIAGNOSIS — I10 Essential (primary) hypertension: Secondary | ICD-10-CM | POA: Diagnosis not present

## 2023-08-09 DIAGNOSIS — J159 Unspecified bacterial pneumonia: Secondary | ICD-10-CM | POA: Diagnosis not present

## 2023-08-09 DIAGNOSIS — G4700 Insomnia, unspecified: Secondary | ICD-10-CM | POA: Diagnosis not present

## 2023-08-09 DIAGNOSIS — E1142 Type 2 diabetes mellitus with diabetic polyneuropathy: Secondary | ICD-10-CM | POA: Diagnosis not present

## 2023-08-17 DIAGNOSIS — J159 Unspecified bacterial pneumonia: Secondary | ICD-10-CM | POA: Diagnosis not present

## 2023-08-17 DIAGNOSIS — K2100 Gastro-esophageal reflux disease with esophagitis, without bleeding: Secondary | ICD-10-CM | POA: Diagnosis not present

## 2023-08-17 DIAGNOSIS — I700 Atherosclerosis of aorta: Secondary | ICD-10-CM | POA: Diagnosis not present

## 2023-08-17 DIAGNOSIS — G4700 Insomnia, unspecified: Secondary | ICD-10-CM | POA: Diagnosis not present

## 2023-08-17 DIAGNOSIS — E1142 Type 2 diabetes mellitus with diabetic polyneuropathy: Secondary | ICD-10-CM | POA: Diagnosis not present

## 2023-08-17 DIAGNOSIS — I10 Essential (primary) hypertension: Secondary | ICD-10-CM | POA: Diagnosis not present

## 2023-08-23 DIAGNOSIS — K2100 Gastro-esophageal reflux disease with esophagitis, without bleeding: Secondary | ICD-10-CM | POA: Diagnosis not present

## 2023-08-23 DIAGNOSIS — I10 Essential (primary) hypertension: Secondary | ICD-10-CM | POA: Diagnosis not present

## 2023-08-23 DIAGNOSIS — E1142 Type 2 diabetes mellitus with diabetic polyneuropathy: Secondary | ICD-10-CM | POA: Diagnosis not present

## 2023-08-23 DIAGNOSIS — G4700 Insomnia, unspecified: Secondary | ICD-10-CM | POA: Diagnosis not present

## 2023-08-29 DIAGNOSIS — K2100 Gastro-esophageal reflux disease with esophagitis, without bleeding: Secondary | ICD-10-CM | POA: Diagnosis not present

## 2023-08-29 DIAGNOSIS — G4700 Insomnia, unspecified: Secondary | ICD-10-CM | POA: Diagnosis not present

## 2023-08-29 DIAGNOSIS — I700 Atherosclerosis of aorta: Secondary | ICD-10-CM | POA: Diagnosis not present

## 2023-08-29 DIAGNOSIS — J159 Unspecified bacterial pneumonia: Secondary | ICD-10-CM | POA: Diagnosis not present

## 2023-08-29 DIAGNOSIS — I10 Essential (primary) hypertension: Secondary | ICD-10-CM | POA: Diagnosis not present

## 2023-08-29 DIAGNOSIS — E1142 Type 2 diabetes mellitus with diabetic polyneuropathy: Secondary | ICD-10-CM | POA: Diagnosis not present

## 2023-09-15 DIAGNOSIS — Z1331 Encounter for screening for depression: Secondary | ICD-10-CM | POA: Diagnosis not present

## 2023-09-15 DIAGNOSIS — Z79899 Other long term (current) drug therapy: Secondary | ICD-10-CM | POA: Diagnosis not present

## 2023-09-15 DIAGNOSIS — Z1339 Encounter for screening examination for other mental health and behavioral disorders: Secondary | ICD-10-CM | POA: Diagnosis not present

## 2023-09-15 DIAGNOSIS — Z125 Encounter for screening for malignant neoplasm of prostate: Secondary | ICD-10-CM | POA: Diagnosis not present

## 2023-09-15 DIAGNOSIS — F419 Anxiety disorder, unspecified: Secondary | ICD-10-CM | POA: Diagnosis not present

## 2023-09-15 DIAGNOSIS — R5383 Other fatigue: Secondary | ICD-10-CM | POA: Diagnosis not present

## 2023-09-15 DIAGNOSIS — E1165 Type 2 diabetes mellitus with hyperglycemia: Secondary | ICD-10-CM | POA: Diagnosis not present

## 2023-09-15 DIAGNOSIS — Z299 Encounter for prophylactic measures, unspecified: Secondary | ICD-10-CM | POA: Diagnosis not present

## 2023-09-15 DIAGNOSIS — I10 Essential (primary) hypertension: Secondary | ICD-10-CM | POA: Diagnosis not present

## 2023-09-15 DIAGNOSIS — E7800 Pure hypercholesterolemia, unspecified: Secondary | ICD-10-CM | POA: Diagnosis not present

## 2023-09-15 DIAGNOSIS — Z7189 Other specified counseling: Secondary | ICD-10-CM | POA: Diagnosis not present

## 2023-09-15 DIAGNOSIS — Z0000 Encounter for general adult medical examination without abnormal findings: Secondary | ICD-10-CM | POA: Diagnosis not present

## 2023-09-22 ENCOUNTER — Encounter (INDEPENDENT_AMBULATORY_CARE_PROVIDER_SITE_OTHER): Admitting: Ophthalmology

## 2023-11-09 DIAGNOSIS — F331 Major depressive disorder, recurrent, moderate: Secondary | ICD-10-CM | POA: Diagnosis not present

## 2023-11-09 DIAGNOSIS — I10 Essential (primary) hypertension: Secondary | ICD-10-CM | POA: Diagnosis not present

## 2023-11-09 DIAGNOSIS — Z299 Encounter for prophylactic measures, unspecified: Secondary | ICD-10-CM | POA: Diagnosis not present

## 2023-11-09 DIAGNOSIS — K2100 Gastro-esophageal reflux disease with esophagitis, without bleeding: Secondary | ICD-10-CM | POA: Diagnosis not present

## 2023-11-17 DIAGNOSIS — Z23 Encounter for immunization: Secondary | ICD-10-CM | POA: Diagnosis not present

## 2023-12-28 DIAGNOSIS — Z299 Encounter for prophylactic measures, unspecified: Secondary | ICD-10-CM | POA: Diagnosis not present

## 2023-12-28 DIAGNOSIS — E119 Type 2 diabetes mellitus without complications: Secondary | ICD-10-CM | POA: Diagnosis not present

## 2023-12-28 DIAGNOSIS — K219 Gastro-esophageal reflux disease without esophagitis: Secondary | ICD-10-CM | POA: Diagnosis not present

## 2023-12-28 DIAGNOSIS — Z23 Encounter for immunization: Secondary | ICD-10-CM | POA: Diagnosis not present

## 2023-12-28 DIAGNOSIS — I10 Essential (primary) hypertension: Secondary | ICD-10-CM | POA: Diagnosis not present

## 2024-01-24 DIAGNOSIS — E1169 Type 2 diabetes mellitus with other specified complication: Secondary | ICD-10-CM | POA: Diagnosis not present

## 2024-01-24 DIAGNOSIS — Z299 Encounter for prophylactic measures, unspecified: Secondary | ICD-10-CM | POA: Diagnosis not present

## 2024-01-24 DIAGNOSIS — K2100 Gastro-esophageal reflux disease with esophagitis, without bleeding: Secondary | ICD-10-CM | POA: Diagnosis not present

## 2024-01-24 DIAGNOSIS — R52 Pain, unspecified: Secondary | ICD-10-CM | POA: Diagnosis not present

## 2024-01-24 DIAGNOSIS — R141 Gas pain: Secondary | ICD-10-CM | POA: Diagnosis not present

## 2024-01-24 DIAGNOSIS — K219 Gastro-esophageal reflux disease without esophagitis: Secondary | ICD-10-CM | POA: Diagnosis not present

## 2024-01-24 DIAGNOSIS — I10 Essential (primary) hypertension: Secondary | ICD-10-CM | POA: Diagnosis not present

## 2024-02-14 DIAGNOSIS — F419 Anxiety disorder, unspecified: Secondary | ICD-10-CM | POA: Diagnosis not present

## 2024-02-14 DIAGNOSIS — K219 Gastro-esophageal reflux disease without esophagitis: Secondary | ICD-10-CM | POA: Diagnosis not present

## 2024-02-14 DIAGNOSIS — Z299 Encounter for prophylactic measures, unspecified: Secondary | ICD-10-CM | POA: Diagnosis not present
